# Patient Record
Sex: Female | Born: 1960 | Race: White | Hispanic: No | Marital: Married | Smoking: Never smoker
Health system: Southern US, Community
[De-identification: ages and names within clinical notes are randomized; demographics above are authoritative.]

## PROBLEM LIST (undated history)

## (undated) DIAGNOSIS — T7840XA Allergy, unspecified, initial encounter: Secondary | ICD-10-CM

## (undated) DIAGNOSIS — M069 Rheumatoid arthritis, unspecified: Secondary | ICD-10-CM

## (undated) DIAGNOSIS — G4711 Idiopathic hypersomnia with long sleep time: Secondary | ICD-10-CM

## (undated) DIAGNOSIS — R87619 Unspecified abnormal cytological findings in specimens from cervix uteri: Secondary | ICD-10-CM

## (undated) DIAGNOSIS — K589 Irritable bowel syndrome without diarrhea: Secondary | ICD-10-CM

## (undated) HISTORY — DX: Allergy, unspecified, initial encounter: T78.40XA

## (undated) HISTORY — DX: Irritable bowel syndrome, unspecified: K58.9

## (undated) HISTORY — PX: CRYOTHERAPY: SHX1416

## (undated) HISTORY — DX: Unspecified abnormal cytological findings in specimens from cervix uteri: R87.619

## (undated) HISTORY — DX: Idiopathic hypersomnia with long sleep time: G47.11

## (undated) HISTORY — PX: SHOULDER SURGERY: SHX246

## (undated) HISTORY — DX: Rheumatoid arthritis, unspecified: M06.9

## (undated) HISTORY — PX: TONSILLECTOMY: SHX5217

---

## 1994-05-26 DIAGNOSIS — R87619 Unspecified abnormal cytological findings in specimens from cervix uteri: Secondary | ICD-10-CM

## 1994-05-26 HISTORY — DX: Unspecified abnormal cytological findings in specimens from cervix uteri: R87.619

## 2000-03-31 ENCOUNTER — Other Ambulatory Visit: Admission: RE | Admit: 2000-03-31 | Discharge: 2000-03-31 | Payer: Self-pay | Admitting: *Deleted

## 2001-06-16 ENCOUNTER — Other Ambulatory Visit: Admission: RE | Admit: 2001-06-16 | Discharge: 2001-06-16 | Payer: Self-pay | Admitting: *Deleted

## 2001-12-19 ENCOUNTER — Ambulatory Visit (HOSPITAL_BASED_OUTPATIENT_CLINIC_OR_DEPARTMENT_OTHER): Admission: RE | Admit: 2001-12-19 | Discharge: 2001-12-19 | Payer: Self-pay | Admitting: Pulmonary Disease

## 2001-12-19 ENCOUNTER — Encounter: Payer: Self-pay | Admitting: Pulmonary Disease

## 2003-07-05 ENCOUNTER — Encounter: Payer: Self-pay | Admitting: Pulmonary Disease

## 2003-07-05 ENCOUNTER — Ambulatory Visit (HOSPITAL_BASED_OUTPATIENT_CLINIC_OR_DEPARTMENT_OTHER): Admission: RE | Admit: 2003-07-05 | Discharge: 2003-07-05 | Payer: Self-pay | Admitting: Pulmonary Disease

## 2004-01-18 ENCOUNTER — Ambulatory Visit (HOSPITAL_COMMUNITY): Admission: RE | Admit: 2004-01-18 | Discharge: 2004-01-18 | Payer: Self-pay | Admitting: Pulmonary Disease

## 2004-03-07 ENCOUNTER — Ambulatory Visit (HOSPITAL_COMMUNITY): Admission: RE | Admit: 2004-03-07 | Discharge: 2004-03-07 | Payer: Self-pay | Admitting: Neurology

## 2004-12-11 ENCOUNTER — Ambulatory Visit: Payer: Self-pay | Admitting: Pulmonary Disease

## 2005-10-17 ENCOUNTER — Ambulatory Visit: Payer: Self-pay | Admitting: Pulmonary Disease

## 2006-04-29 ENCOUNTER — Encounter: Admission: RE | Admit: 2006-04-29 | Discharge: 2006-04-29 | Payer: Self-pay | Admitting: Pulmonary Disease

## 2006-05-06 ENCOUNTER — Encounter: Admission: RE | Admit: 2006-05-06 | Discharge: 2006-05-06 | Payer: Self-pay | Admitting: Pulmonary Disease

## 2006-07-14 ENCOUNTER — Ambulatory Visit: Payer: Self-pay | Admitting: Pulmonary Disease

## 2006-07-21 ENCOUNTER — Ambulatory Visit: Payer: Self-pay | Admitting: Pulmonary Disease

## 2006-07-21 LAB — CONVERTED CEMR LAB
ALT: 21 units/L (ref 0–40)
AST: 25 units/L (ref 0–37)
Albumin: 3.7 g/dL (ref 3.5–5.2)
Basophils Relative: 0.3 % (ref 0.0–1.0)
Bilirubin Urine: NEGATIVE
CO2: 29 meq/L (ref 19–32)
Calcium: 9.1 mg/dL (ref 8.4–10.5)
Chloride: 105 meq/L (ref 96–112)
Creatinine, Ser: 1.1 mg/dL (ref 0.4–1.2)
Eosinophils Absolute: 0.1 10*3/uL (ref 0.0–0.6)
Eosinophils Relative: 1.6 % (ref 0.0–5.0)
Monocytes Absolute: 0.6 10*3/uL (ref 0.2–0.7)
Neutro Abs: 4 10*3/uL (ref 1.4–7.7)
RBC: 4.49 M/uL (ref 3.87–5.11)
Sed Rate: 4 mm/hr (ref 0–25)
Specific Gravity, Urine: 1.015 (ref 1.000–1.03)
Total Bilirubin: 1.3 mg/dL — ABNORMAL HIGH (ref 0.3–1.2)
Urine Glucose: NEGATIVE mg/dL
Urobilinogen, UA: 0.2 (ref 0.0–1.0)
VLDL: 14 mg/dL (ref 0–40)
pH: 6 (ref 5.0–8.0)

## 2006-07-22 ENCOUNTER — Encounter: Payer: Self-pay | Admitting: Pulmonary Disease

## 2006-07-22 ENCOUNTER — Encounter: Admission: RE | Admit: 2006-07-22 | Discharge: 2006-07-22 | Payer: Self-pay | Admitting: Pulmonary Disease

## 2006-10-23 ENCOUNTER — Ambulatory Visit (HOSPITAL_COMMUNITY): Admission: RE | Admit: 2006-10-23 | Discharge: 2006-10-23 | Payer: Self-pay | Admitting: Neurology

## 2007-05-10 ENCOUNTER — Encounter: Payer: Self-pay | Admitting: Pulmonary Disease

## 2007-05-10 DIAGNOSIS — T7840XA Allergy, unspecified, initial encounter: Secondary | ICD-10-CM | POA: Insufficient documentation

## 2007-07-02 ENCOUNTER — Ambulatory Visit: Payer: Self-pay | Admitting: Pulmonary Disease

## 2007-07-02 DIAGNOSIS — G4711 Idiopathic hypersomnia with long sleep time: Secondary | ICD-10-CM | POA: Insufficient documentation

## 2007-07-02 DIAGNOSIS — G47419 Narcolepsy without cataplexy: Secondary | ICD-10-CM | POA: Insufficient documentation

## 2007-07-19 ENCOUNTER — Telehealth (INDEPENDENT_AMBULATORY_CARE_PROVIDER_SITE_OTHER): Payer: Self-pay | Admitting: *Deleted

## 2007-07-27 ENCOUNTER — Telehealth (INDEPENDENT_AMBULATORY_CARE_PROVIDER_SITE_OTHER): Payer: Self-pay | Admitting: *Deleted

## 2007-08-04 ENCOUNTER — Encounter: Payer: Self-pay | Admitting: Pulmonary Disease

## 2007-08-05 ENCOUNTER — Encounter: Payer: Self-pay | Admitting: Pulmonary Disease

## 2007-09-01 ENCOUNTER — Telehealth: Payer: Self-pay | Admitting: Pulmonary Disease

## 2007-10-21 ENCOUNTER — Other Ambulatory Visit: Admission: RE | Admit: 2007-10-21 | Discharge: 2007-10-21 | Payer: Self-pay | Admitting: Obstetrics and Gynecology

## 2008-04-27 ENCOUNTER — Ambulatory Visit: Payer: Self-pay | Admitting: Pulmonary Disease

## 2008-05-30 ENCOUNTER — Telehealth (INDEPENDENT_AMBULATORY_CARE_PROVIDER_SITE_OTHER): Payer: Self-pay | Admitting: *Deleted

## 2008-07-19 ENCOUNTER — Telehealth: Payer: Self-pay | Admitting: Pulmonary Disease

## 2009-03-22 ENCOUNTER — Telehealth (INDEPENDENT_AMBULATORY_CARE_PROVIDER_SITE_OTHER): Payer: Self-pay | Admitting: *Deleted

## 2009-04-06 ENCOUNTER — Telehealth: Payer: Self-pay | Admitting: Pulmonary Disease

## 2009-04-10 ENCOUNTER — Encounter: Payer: Self-pay | Admitting: Pulmonary Disease

## 2009-04-25 ENCOUNTER — Ambulatory Visit: Payer: Self-pay | Admitting: Pulmonary Disease

## 2009-12-21 ENCOUNTER — Ambulatory Visit: Payer: Self-pay | Admitting: Pulmonary Disease

## 2010-01-22 ENCOUNTER — Encounter: Admission: RE | Admit: 2010-01-22 | Discharge: 2010-01-22 | Payer: Self-pay | Admitting: Obstetrics and Gynecology

## 2010-04-01 ENCOUNTER — Telehealth (INDEPENDENT_AMBULATORY_CARE_PROVIDER_SITE_OTHER): Payer: Self-pay | Admitting: *Deleted

## 2010-06-16 ENCOUNTER — Encounter: Payer: Self-pay | Admitting: Pulmonary Disease

## 2010-06-20 ENCOUNTER — Telehealth (INDEPENDENT_AMBULATORY_CARE_PROVIDER_SITE_OTHER): Payer: Self-pay | Admitting: *Deleted

## 2010-06-25 NOTE — Assessment & Plan Note (Signed)
Summary: no visit...apptm mixup.   Copy to:  Anne Hahn Primary Provider/Referring Provider:  Kriste Basque  CC:  Pt is here for a f/u appt.   Pt denied any complaints today.  Pt is just requesting a refill on her Provigil. Marland Kitchen  History of Present Illness: no visit required.  Medications Prior to Update: 1)  Provigil 200 Mg Tabs (Modafinil) .... Take 1 Tablet By Mouth Two Times A Day  Allergies (verified): 1)  ! Keflex  Vital Signs:  Patient profile:   50 year old female Height:      64 inches Weight:      121 pounds BMI:     20.84 O2 Sat:      98 % on Room air Temp:     98.1 degrees F oral Pulse rate:   78 / minute BP sitting:   104 / 66  (left arm) Cuff size:   regular  Vitals Entered By: Arman Filter LPN (December 21, 2009 9:04 AM)  O2 Flow:  Room air CC: Pt is here for a f/u appt.   Pt denied any complaints today.  Pt is just requesting a refill on her Provigil.  Comments Medications reviewed with patient Arman Filter LPN  December 21, 2009 9:04 AM     Other Orders: No Charge Patient Arrived (NCPA0) (NCPA0) Prescriptions: PROVIGIL 200 MG TABS (MODAFINIL) Take 1 tablet by mouth two times a day  #60 x 12   Entered and Authorized by:   Barbaraann Share MD   Signed by:   Barbaraann Share MD on 12/21/2009   Method used:   Print then Give to Patient   RxID:   [id number]

## 2010-06-25 NOTE — Progress Notes (Signed)
Summary: rx  Phone Note Call from Patient Call back at [phone]   Caller: Patient Call For: Miranda [name] Reason for Call: Talk to Nurse Summary of Call: want to switch back to Provigil... New stuff not working as well.   [facility] [address] Initial call taken by: Eugene Gavia,  July 19, 2008 9:02 AM  Follow-up for Phone Call        called and spoke with pt---she stated that she would like to switch back to provigil---she stated that the nuvigil seems not to work as well.  pt stated that she will take her last one today--explained that [facility] was not in the office this week, and pt voiced her understanding.  please advise. Marijo File CMA  July 19, 2008 9:23 AM   Additional Follow-up for Phone Call Additional follow up Details #1::        ok to switch back to provigil as before. Additional Follow-up by: Barbaraann Share MD,  July 24, 2008 7:56 PM    Additional Follow-up for Phone Call Additional follow up Details #2::    called and spoke with pt. pt aware ok to switch back to provigil.  Megan Reynolds LPN  July 25, 9321 9:25 AM   New/Updated Medications: PROVIGIL 200 MG TABS (MODAFINIL) Take 1 tablet by mouth two times a day   Prescriptions: PROVIGIL 200 MG TABS (MODAFINIL) Take 1 tablet by mouth two times a day  #60 x 5   Entered by:   Arman Filter LPN   Authorized by:   Barbaraann Share MD   Signed by:   Arman Filter LPN on 55/73/2202   Method used:   Telephoned to ...       [facility]  [place]. [phone]* (retail)       [address]       [place], [place]  [address]       Ph: [phone]       Fax: [phone]   RxID:   [id number]

## 2010-06-25 NOTE — Progress Notes (Signed)
Summary: requesting rheumatologist referral -  Phone Note Call from Patient   Caller: Patient Call For: clance Summary of Call: pt states that dr clance is aware that she will contact him for a referral to see rheumatologist. Initial call taken by: Rickard Patience,  April 01, 2010 3:44 PM  Follow-up for Phone Call        Called, spoke with pt.  She is requesting for [facility] to refer her to rheumatoligist at [facility] - Dr. Louann Liv.  States she does not have a PCP right now.  Dr. Shelle Iron, pls advise if this is ok.  Thanks! Follow-up by: Gweneth Dimitri RN,  April 01, 2010 4:15 PM  Additional Follow-up for Phone Call Additional follow up Details #1::        I thought nadel was her primary md? I am happy to refer her, but why does she think she needs to see a rheumatologist? Additional Follow-up by: Barbaraann Share MD,  April 01, 2010 4:43 PM    Additional Follow-up for Phone Call Additional follow up Details #2::    Spoke with pt.  She states that she used to see SN, but not since 2008.  She states that she would like to be reffered to neurologist b/c she has had joint pain and muscle weakness that has been worsening over the past 6 months.  Pls advise if okay to send order to [facility] thanks! Follow-up by: Vernie Murders,  April 01, 2010 4:52 PM  Additional Follow-up for Phone Call Additional follow up Details #3:: Details for Additional Follow-up Action Taken: so is it a rheumatologist or neurologist.  each message says something different.  Sorry, should state rheumatologist Vernie Murders  April 01, 2010 5:02 PM  referral sent to [facility].  Pt aware order was sent to [facility] for referral Vernie Murders  April 01, 2010 5:16 PM  Additional Follow-up by: Barbaraann Share MD,  April 01, 2010 4:56 PM    [facility] to inform pt order sent to [facility]. Gweneth Dimitri RN  April 01, 2010 5:11 PM

## 2010-06-26 ENCOUNTER — Telehealth (INDEPENDENT_AMBULATORY_CARE_PROVIDER_SITE_OTHER): Payer: Self-pay | Admitting: *Deleted

## 2010-06-27 NOTE — Progress Notes (Signed)
Summary: prior auth for Provigil  Phone Note Call from Patient   Caller: Patient Call For: dr Shelle Iron Summary of Call: patient phoned stated that her phamacy advised her that they faxed a request yesterday for her Provigil because it needs to pre authorized. Patient just wanted to verify that we recieved the request and that we are taking care of this. She uses [facility]. She can be reached at [phone] Initial call taken by: Vedia Coffer,  June 20, 2010 3:13 PM  Follow-up for Phone Call        pt says that [facility] [address] has been waiting x 2 days for prior auth. pt needs this today as she is out. Tivis Ringer, CNA  June 21, 2010 8:44 AM  Called [employer] to initate prior auth.  Spoke with [employer].  Per Lynden Ang, there is already an authorization for provigil 200mg  from 03/23/09-12/17/11.  States there was a Data processing manager in the system, and they needed to update the pharmacy's system.  States in approx 30 minutes, this should be able to go thru without any problems.   [facility] [address], spoke with Syam.  He was informed of above and verbalized understanding.    Called, spoke with pt.  She was also informed of above and verbalized understanding. Follow-up by: Gweneth Dimitri RN,  June 21, 2010 9:14 AM

## 2010-07-03 NOTE — Progress Notes (Signed)
Summary: records with referral to [facility]  Phone Note Call from Patient   Caller: Patient Call For: [facility] Summary of Call: patient phoned stated that we are referring her to [facility] and she was told the records would be sent automotcially with the referral and she spoke to them today and they have stated that they have not received the records. Patient can be reached at [phone] Initial call taken by: Vedia Coffer,  June 26, 2010 4:41 PM  Follow-up for Phone Call        will refax records to dr @ [facility] pt notified Follow-up by: Oneita Jolly,  June 27, 2010 8:36 AM

## 2010-08-23 ENCOUNTER — Other Ambulatory Visit: Payer: Self-pay | Admitting: Pulmonary Disease

## 2010-08-26 ENCOUNTER — Other Ambulatory Visit: Payer: Self-pay | Admitting: Pulmonary Disease

## 2010-12-20 ENCOUNTER — Ambulatory Visit: Payer: Self-pay | Admitting: Pulmonary Disease

## 2010-12-20 ENCOUNTER — Encounter: Payer: Self-pay | Admitting: Pulmonary Disease

## 2010-12-25 ENCOUNTER — Encounter: Payer: Self-pay | Admitting: Pulmonary Disease

## 2010-12-25 ENCOUNTER — Ambulatory Visit (INDEPENDENT_AMBULATORY_CARE_PROVIDER_SITE_OTHER): Payer: BC Managed Care – PPO | Admitting: Pulmonary Disease

## 2010-12-25 VITALS — BP 100/58 | HR 94 | Temp 97.8°F | Ht 63.0 in | Wt 116.8 lb

## 2010-12-25 DIAGNOSIS — G4710 Hypersomnia, unspecified: Secondary | ICD-10-CM

## 2010-12-25 MED ORDER — MODAFINIL 200 MG PO TABS: 200.0000 mg | ORAL_TABLET | Freq: Two times a day (BID) | ORAL | Status: DC

## 2010-12-25 NOTE — Patient Instructions (Signed)
No change in meds for now. If you improve significantly with treatment of your autoimmune process, I would give you a trial off provigil to see if still needed.  followup with me in one year if doing well.

## 2010-12-25 NOTE — Progress Notes (Signed)
  Subjective:    Patient ID: Miranda [name], female    DOB: March 04, 1961, 50 y.o.   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  She is staying on a good sleep hygiene regimen, and is taking  provigil in order to maintain her functional status/alertness during the day.  She feels the medication is working for her, and is not having any worsening daytime sleepiness.  Of note, she has been recently diagnosed with some type of autoimmune disease by rheumatology at [facility], and is currently taking plaquenil.    Review of Systems  Constitutional: Positive for fever. Negative for unexpected weight change.  HENT: Positive for postnasal drip. Negative for ear pain, nosebleeds, congestion, sore throat, rhinorrhea, sneezing, trouble swallowing, dental problem and sinus pressure.   Eyes: Negative for redness and itching.  Respiratory: Negative for cough, chest tightness, shortness of breath and wheezing.   Cardiovascular: Negative for palpitations and leg swelling.  Gastrointestinal: Negative for nausea and vomiting.  Genitourinary: Negative for dysuria.  Musculoskeletal: Positive for joint swelling.  Skin: Positive for rash.  Neurological: Negative for headaches.  Hematological: Bruises/bleeds easily.  Psychiatric/Behavioral: Negative for dysphoric mood. The patient is not nervous/anxious.        Objective:   Physical Exam Wd female in nad Nares without discharge or purulence LE without edema or cyanosis  Alert, oriented, not sleepy.  Moves all 4        Assessment & Plan:

## 2010-12-30 NOTE — Assessment & Plan Note (Signed)
The pt appears to be functioning reasonably well on her current behavioral and medication regimen.  I have asked her to continue with good sleep hygiene, exercise, and to use provigil for improved functioning during the day.  I do wonder if her recent diagnosis of an autoimmune process could be related to her hypersomnia?

## 2011-01-29 ENCOUNTER — Telehealth: Payer: Self-pay | Admitting: Pulmonary Disease

## 2011-01-29 NOTE — Telephone Encounter (Signed)
Spoke with the pt and she states she is having difficulty staying asleep at night and she is requesting an rx for [place]. She states she was given this from Dr. Shelle Iron in the past and it helped. She states he offered this at last OV but she said she did not need it at that time, but has since began to have issues.  Pt states she does not have any issues falling asleep. Please advise. Carron Curie, CMA

## 2011-01-29 NOTE — Telephone Encounter (Signed)
[id number].Makinze Jani, CMA  

## 2011-01-29 NOTE — Telephone Encounter (Signed)
I am happy to prescribe ambien SHORT term, while she is working on sleep hygiene.  This is not something I prescribe for a longer period of time. ambien cr 6.25mg  one at bedtime if needed.  #15, no fills.

## 2011-01-30 MED ORDER — ZOLPIDEM TARTRATE ER 6.25 MG PO TBCR: 6.2500 mg | EXTENDED_RELEASE_TABLET | Freq: Every evening | ORAL | Status: AC | PRN

## 2011-01-30 NOTE — Telephone Encounter (Signed)
LMTCB

## 2011-01-30 NOTE — Telephone Encounter (Signed)
Pt is aware of rx per KC. RX called to her pharmacy.

## 2011-02-26 ENCOUNTER — Telehealth: Payer: Self-pay | Admitting: Pulmonary Disease

## 2011-02-26 NOTE — Telephone Encounter (Signed)
I spoke with Budd Palmer about this.  He states if she brings her order from her rheumatologist to our lab they can draw this without [place] putting the orders in.  We have been rejected in the past, but Budd Palmer says this is wrong.  If we get a call from lab that won't do when she arrives, let Budd Palmer know and he will handle.

## 2011-02-26 NOTE — Telephone Encounter (Signed)
Dr. Shelle Iron referred the pt to Dr. Carolan Clines, rheumatology back in 04-01-2010. She saw him recently and he wrote orders to have a CBC w/ diff, creatinine, and hepatic function (Dx: V58.69)  done here, but our lab will not accept orders from outside docs. Pt wants to know if [facility] will order these labs so she can get them done here. Please advise. Carron Curie, CMA

## 2011-02-27 NOTE — Telephone Encounter (Signed)
After speaking with TD and Budd Palmer, it is known that the lab is not able to receive and outside paper order and that a physician within the system needs to be the responsible party and we will need to place order in from the floor. I called pt and she advised she does not have a PCP but has been to [facility] in the past. I informed pt that [facility] does see pt's on an PCP level and with her living in [place] and [place]. Louann Liv being in [place] she should go to [place] with order in hand and ask them to help her. Pt verbalized understanding and agreed with same. Pt states she has BC[employer] for insurance and does not believe she has to use any one lab and this will be for a standing order. Also encouraged pt to get a PCP ASAP.

## 2011-03-11 DIAGNOSIS — Z79899 Other long term (current) drug therapy: Secondary | ICD-10-CM | POA: Insufficient documentation

## 2011-03-11 DIAGNOSIS — M359 Systemic involvement of connective tissue, unspecified: Secondary | ICD-10-CM | POA: Insufficient documentation

## 2011-03-11 DIAGNOSIS — M489 Spondylopathy, unspecified: Secondary | ICD-10-CM | POA: Insufficient documentation

## 2011-04-29 ENCOUNTER — Other Ambulatory Visit: Payer: Self-pay | Admitting: Obstetrics and Gynecology

## 2011-04-29 DIAGNOSIS — Z1231 Encounter for screening mammogram for malignant neoplasm of breast: Secondary | ICD-10-CM

## 2011-05-28 ENCOUNTER — Ambulatory Visit
Admission: RE | Admit: 2011-05-28 | Discharge: 2011-05-28 | Disposition: A | Discharge status: Home / Self Care | Payer: BC Managed Care – PPO | Source: Ambulatory Visit | Attending: Obstetrics and Gynecology | Admitting: Obstetrics and Gynecology

## 2011-05-28 DIAGNOSIS — Z1231 Encounter for screening mammogram for malignant neoplasm of breast: Secondary | ICD-10-CM

## 2011-06-23 ENCOUNTER — Telehealth: Payer: Self-pay | Admitting: Pulmonary Disease

## 2011-06-23 NOTE — Telephone Encounter (Signed)
Spoke with pt, she states needs PA for provigil.  I have called the pharmacy and asked that they refax this form to the up front fax Will await fax, this must be done today as the pt is out of med as of today

## 2011-06-23 NOTE — Telephone Encounter (Signed)
Form printed and placed in KC's VIP folder.

## 2011-06-23 NOTE — Telephone Encounter (Addendum)
Received PA request from [facility]- called [employer] at [phone] Pt ID number [id number] Called to initiate PA and will await fax to be sent to the fax up front Reference number for this case is [id number]

## 2011-06-23 NOTE — Telephone Encounter (Signed)
done

## 2011-06-24 NOTE — Telephone Encounter (Signed)
Prior Auth faxed back yesterday afternoon at 5:45pm.  Will await approval or denial

## 2011-06-26 NOTE — Telephone Encounter (Signed)
Called and spoke with pt to give her an update on where we were at with her prior auth for Provigil.  Pt stated in the meantime, she is completely out of the med and wanted to pay out of pocket for a few tabs to last until we can get this straightened out with her ins.  i called and spoke with pt's pharmacy and was informed each tablet costs $36.99 but with [facility] discount it is $31.44.  Informed pt of this.  And stated we will keep in touch with her regarding this process.

## 2011-06-26 NOTE — Telephone Encounter (Signed)
Called and spoke with pt. Informed her still haven't gotten approval or denial from ins company.  Informed her response can take anywhere from 24-72 hrs but that i would call later today to check on status and then call her back with an update.

## 2011-06-26 NOTE — Telephone Encounter (Signed)
Pt called again- checking on status of prior auth. Hazel Sams

## 2011-06-26 NOTE — Telephone Encounter (Signed)
We need to speak with Valerie Roys, ext [phone], RN with the insurance company. Per rep, the nurse faxed a request for the sleep study to [facility] in our medical records today. He said they would need a copy of this study in order to make their decision. Corrie Dandy has left for the day and I had to leave her a msg to return my call on the next business day. Aundra Millet made aware and she will contact the pt to let her know what the hold-up is on her prescription.

## 2011-06-27 NOTE — Telephone Encounter (Signed)
Received fax from [facility] today stating Provigil has been approved from 06/23/2011-03/18/2014.  Ref # is [id number].  I have informed both pt and her pharmacy of the above information and I have sent paperwork down to med. recs to be scanned into pt's chart.

## 2011-06-30 ENCOUNTER — Telehealth: Payer: Self-pay | Admitting: Pulmonary Disease

## 2011-06-30 MED ORDER — MODAFINIL 200 MG PO TABS: 200.0000 mg | ORAL_TABLET | Freq: Two times a day (BID) | ORAL | Status: DC

## 2011-06-30 NOTE — Telephone Encounter (Signed)
[facility]. Pt aware.Carron Curie, CMA

## 2011-07-22 ENCOUNTER — Telehealth (HOSPITAL_COMMUNITY): Payer: Self-pay

## 2011-10-28 ENCOUNTER — Encounter: Payer: BC Managed Care – PPO | Attending: "Endocrinology | Admitting: *Deleted

## 2011-10-28 DIAGNOSIS — Z713 Dietary counseling and surveillance: Secondary | ICD-10-CM | POA: Insufficient documentation

## 2011-10-29 NOTE — Progress Notes (Signed)
Patient attended basic nutrition class on 10/28/11.  Topics covered include:   1. Complications of Hyperlipidemia and/or Hypertension. 2. Ways to reduce risk of heart disease.  3. Identifying fat and sodium content on food labels. 4. Ways to decrease sodium intake. 5. Optimal amount of daily saturated fat intake. 6. Optimal amount of daily sodium intake.  7. Foods to limit/avoid on a heart healthy diet. 8. MyPlate and portion control.   Patient to follow-up with [facility] prn.  

## 2011-12-10 ENCOUNTER — Encounter (HOSPITAL_COMMUNITY): Payer: Self-pay | Admitting: *Deleted

## 2011-12-10 ENCOUNTER — Emergency Department (HOSPITAL_COMMUNITY)
Admission: EM | Admit: 2011-12-10 | Discharge: 2011-12-10 | Disposition: A | Discharge status: Home / Self Care | Payer: BC Managed Care – PPO | Source: Home / Self Care | Attending: Emergency Medicine | Admitting: Emergency Medicine

## 2011-12-10 DIAGNOSIS — L259 Unspecified contact dermatitis, unspecified cause: Secondary | ICD-10-CM

## 2011-12-10 MED ORDER — TRIAMCINOLONE ACETONIDE 0.1 % EX CREA: TOPICAL_CREAM | Freq: Two times a day (BID) | CUTANEOUS | Status: DC

## 2011-12-10 MED ORDER — PREDNISONE 20 MG PO TABS: 20.0000 mg | ORAL_TABLET | Freq: Every day | ORAL | Status: DC

## 2011-12-10 MED ORDER — HYDROXYZINE HCL 25 MG PO TABS: 25.0000 mg | ORAL_TABLET | Freq: Three times a day (TID) | ORAL | Status: AC | PRN

## 2011-12-10 MED ORDER — HYDROXYZINE HCL 25 MG PO TABS: 25.0000 mg | ORAL_TABLET | Freq: Four times a day (QID) | ORAL | Status: DC

## 2011-12-10 NOTE — ED Provider Notes (Signed)
History     CSN: [id number]  Arrival date & time 12/10/11  1757   First MD Initiated Contact with Patient 12/10/11 1804      Chief Complaint  Patient presents with  . Rash    (Consider location/radiation/quality/duration/timing/severity/associated sxs/prior treatment) HPI Comments: Patient presents urgent care as she is aware that she has been in contact with poison ivy ..and she has developed a rash both of her arms and the anterior aspect of both of her lower legs. The rash is itchy and it's been getting worse for the last 8 days. She has had poison ivy in the past but this particular event is worse than the previous ones. Patient denies any systemic symptoms such as fevers, generalized malaise, arthralgias or myalgias or headaches she has tried with some over-the-counter creams and lotions for itchiness but they have not helped much.  Patient is a 51 y.o. female presenting with rash. The history is provided by the patient.  Rash  This is a new problem. The current episode started more than 1 week ago. The problem has not changed since onset.The problem is associated with plant contact. There has been no fever. The rash is present on the left ankle, right arm, right lower leg, left wrist and left arm. The patient is experiencing no pain. The pain has been worsening since onset. Associated symptoms include itching and weeping. She has tried OTC analgesics and cold cream for the symptoms. The treatment provided no relief.    Past Medical History  Diagnosis Date  . Idiopathic hypersomnia   . Allergy, unspecified not elsewhere classified     Past Surgical History  Procedure Date  . Shoulder surgery     right  . Tonsillectomy     No family history on file.  History  Substance Use Topics  . Smoking status: Never Smoker   . Smokeless tobacco: Not on file  . Alcohol Use: Not on file    OB History    Grav Para Term Preterm Abortions TAB SAB Ect Mult Living                   Review of Systems  Constitutional: Negative for fever, chills, activity change, appetite change and fatigue.  Skin: Positive for itching and rash. Negative for color change, pallor and wound.    Allergies  Cephalexin  Home Medications   Current Outpatient Rx  Name Route Sig Dispense Refill  . METHOTREXATE PO Oral Take by mouth.    Marland Kitchen HYDROXYCHLOROQUINE SULFATE 200 MG PO TABS Oral Take 200 mg by mouth 2 (two) times daily.      Marland Kitchen HYDROXYZINE HCL 25 MG PO TABS Oral Take 1 tablet (25 mg total) by mouth every 8 (eight) hours as needed for itching. 12 tablet 0  . MODAFINIL 200 MG PO TABS Oral Take 1 tablet (200 mg total) by mouth 2 (two) times daily. 60 tablet 5  . PREDNISONE 20 MG PO TABS Oral Take 1 tablet (20 mg total) by mouth daily. 2 tablets daily for 5 days 10 tablet 0  . TRIAMCINOLONE ACETONIDE 0.1 % EX CREA Topical Apply topically 2 (two) times daily. Apply bid x 2 weeks 30 g 0    BP 104/69  Pulse 82  Temp 97.8 F (36.6 C) (Oral)  Resp 16  SpO2 98%  Physical Exam  Nursing note and vitals reviewed. Constitutional: She appears well-developed and well-nourished.  Non-toxic appearance. She does not have a sickly appearance. She does not appear  ill. No distress.  Musculoskeletal: She exhibits no tenderness.  Neurological: She is alert.  Skin: Rash noted. Rash is macular, papular and pustular. No erythema.       ED Course  Procedures (including critical care time)  Labs Reviewed - No data to display No results found.   1. Contact dermatitis       MDM  Contact dermatitis most likely for poison ivy. Patient was prescribed prednisone for 5 days, topical triamcinolone cream along with hydroxyzine.        Jimmie Molly, MD 12/10/11 2134

## 2011-12-10 NOTE — ED Notes (Signed)
Pt  Reports  Symptoms  Of  Itchy  Rash  On  Both  Arms  l  Worse  thah  The  Right  Symptoms  X  8  Days    She  Reports  Was  [employer]  About 1  Week  Ago       -  She  Reports  Has  Had  Poison ivy in past

## 2011-12-17 ENCOUNTER — Ambulatory Visit (INDEPENDENT_AMBULATORY_CARE_PROVIDER_SITE_OTHER): Payer: BC Managed Care – PPO | Admitting: Family Medicine

## 2011-12-17 VITALS — BP 100/72 | HR 100 | Temp 99.1°F | Resp 18 | Wt 120.0 lb

## 2011-12-17 DIAGNOSIS — L255 Unspecified contact dermatitis due to plants, except food: Secondary | ICD-10-CM

## 2011-12-17 DIAGNOSIS — L237 Allergic contact dermatitis due to plants, except food: Secondary | ICD-10-CM

## 2011-12-17 MED ORDER — METHYLPREDNISOLONE ACETATE 80 MG/ML IJ SUSP
80.0000 mg | Freq: Once | INTRAMUSCULAR | Status: AC
  Administered 2011-12-17: 80 mg via INTRAMUSCULAR

## 2011-12-17 MED ORDER — PREDNISONE 20 MG PO TABS: ORAL_TABLET | ORAL | Status: DC

## 2011-12-17 NOTE — Progress Notes (Signed)
@[email or web address] @  Patient ID: Miranda [name] MRN: [id number], DOB: [birth year], 51 y.o. Date of Encounter: 12/17/2011, 10:04 AM  Primary Physician: No primary provider on file.  Chief Complaint: Pruritic rash  HPI: 51 y.o. year old female with presents with 21 day history of mildly erythematous pruritic rash along scalp, forearms, back and legs. Patient was doing yard work prior to the development of the rash. Known poison ivy in the vicinity. Has not yet washed all clothing or linens that have been exposed. Lesions now weeping clear fluid. Has tried a course of prednisone for 5 days which helped initially..  Patient is otherwise doing well without issues or complaints.  Past Medical History  Diagnosis Date  . Idiopathic hypersomnia   . Allergy, unspecified not elsewhere classified      Home Meds: Prior to Admission medications   Medication Sig Start Date End Date Taking? Authorizing Provider  hydrOXYzine (ATARAX/VISTARIL) 25 MG tablet Take 1 tablet (25 mg total) by mouth every 8 (eight) hours as needed for itching. 12/10/11 12/20/11 Yes Jimmie Molly, MD  Methotrexate Sodium (METHOTREXATE PO) Take by mouth.   Yes Historical Provider, MD  modafinil (PROVIGIL) 200 MG tablet Take 1 tablet (200 mg total) by mouth 2 (two) times daily. 06/30/11  Yes Barbaraann Share, MD  triamcinolone cream (KENALOG) 0.1 % Apply topically 2 (two) times daily. Apply bid x 2 weeks 12/10/11 12/09/12 Yes Jimmie Molly, MD    Allergies:  Allergies  Allergen Reactions  . Cephalexin     History   Social History  . Marital Status: Single    Spouse Name: N/A    Number of Children: N/A  . Years of Education: N/A   Occupational History  . Not on file.   Social History Main Topics  . Smoking status: Never Smoker   . Smokeless tobacco: Not on file  . Alcohol Use: Not on file  . Drug Use: Not on file  . Sexually Active: Not on file   Other Topics Concern  . Not on file   Social History Narrative  . No narrative on  file     Review of Systems: Constitutional: negative for chills, fever, night sweats, weight changes, or fatigue  HEENT: negative for vision changes, hearing loss, congestion, rhinorrhea, ST, epistaxis, or sinus pressure Cardiovascular: negative for chest pain or palpitations Respiratory: negative for hemoptysis, wheezing, shortness of breath, or cough Abdominal: negative for abdominal pain, nausea, vomiting, diarrhea, or constipation Dermatological: see above Neurologic: negative for headache, dizziness, or syncope   Physical Exam: Blood pressure 100/72, pulse 100, temperature 99.1 F (37.3 C), temperature source Oral, resp. rate 18, weight 120 lb (54.432 kg)., There is no height on file to calculate BMI. General: Well developed, well nourished, in no acute distress. Head: Normocephalic, atraumatic, eyes without discharge, sclera non-icteric, nares are without discharge. Bilateral auditory canals clear, TM's are without perforation, pearly grey and translucent with reflective cone of light bilaterally. Oral cavity moist, posterior pharynx without exudate, erythema, peritonsillar abscess, or post nasal drip.  Neck: Supple. No thyromegaly. Full ROM. No lymphadenopathy. Lungs: Clear bilaterally to auscultation without wheezes, rales, or rhonchi. Breathing is unlabored. Heart: RRR with S1 S2. No murmurs, rubs, or gallops appreciated. Msk:  Strength and tone normal for age. Extremities/Skin: Warm and dry. Multiple vesicular weeping lesions along left forearm, diffuse papular rash on back and neck and right arm consistent with poison ivy. No secondary infections present. No clubbing or cyanosis. No edema.  Neuro: Alert and oriented X  3. Moves all extremities spontaneously. Gait is normal. CNII-XII grossly in tact. Psych:  Responds to questions appropriately with a normal affect.      ASSESSMENT AND PLAN:  51 y.o. year old female with poison ivy DepoMedrol 80 -Prednisone 20 mg  #12 -Zyrtec -Zantac -Benadryl -Wash all contaminated clothes and linens -RTC 10 days if symptoms persist, sooner if they worsen   Signed, Elvina Sidle, MD 12/17/2011 10:04 AM

## 2011-12-25 ENCOUNTER — Ambulatory Visit: Payer: BC Managed Care – PPO | Admitting: Pulmonary Disease

## 2011-12-31 ENCOUNTER — Ambulatory Visit (INDEPENDENT_AMBULATORY_CARE_PROVIDER_SITE_OTHER): Payer: BC Managed Care – PPO | Admitting: Family Medicine

## 2011-12-31 ENCOUNTER — Telehealth: Payer: Self-pay

## 2011-12-31 VITALS — BP 109/72 | HR 79 | Temp 98.2°F | Resp 16 | Ht 63.5 in | Wt 121.0 lb

## 2011-12-31 DIAGNOSIS — T50905A Adverse effect of unspecified drugs, medicaments and biological substances, initial encounter: Secondary | ICD-10-CM

## 2011-12-31 DIAGNOSIS — Z79899 Other long term (current) drug therapy: Secondary | ICD-10-CM

## 2011-12-31 DIAGNOSIS — T887XXA Unspecified adverse effect of drug or medicament, initial encounter: Secondary | ICD-10-CM

## 2011-12-31 DIAGNOSIS — L259 Unspecified contact dermatitis, unspecified cause: Secondary | ICD-10-CM

## 2011-12-31 MED ORDER — PREDNISONE 20 MG PO TABS: ORAL_TABLET | ORAL | Status: AC

## 2011-12-31 NOTE — Progress Notes (Signed)
Faxed last two office notes and [facility] office note to Dr. Romeo Apple office. Fax number documents were faxed to [phone].

## 2011-12-31 NOTE — Progress Notes (Signed)
Subjective:    Patient ID: Miranda [name], female    DOB: 1960-12-04, 51 y.o.   MRN: [id number]  HPI Miranda [name] is a 51 y.o. female Seen 7/24 with likely poison ivy - summary form that ov: 21 day history of mildly erythematous pruritic rash along scalp, forearms, back and legs. Patient was doing yard work prior to the development of the rash. Known poison ivy in the vicinity. Has not yet washed all clothing or linens that have been exposed. Lesions now weeping clear fluid. Had tried a course of prednisone for 5 days which helped initially. Treated with prednisone 20mg  #12 - 7 day course, and depomedrol 80mg  IM. Taking antihistamine every 6 hours form [facility] - 1st episode.  2-3 days between prednisone at [facility] and our taper.    Rash was improving on prednisone, but not resolved.  No side effects on prednisone. Rash worse in past 2 days, worst last night.  Same areas involved - coming back and now on chest.  No fever. No new soaps, lotions, or creams.  No new meds.  On provigil for 8 years. No fever, itching keeps up at night.  Similar rash 15 years ago - dx with eczema or contact dermatitis.  Burns with triamcinolone cream. Using once per day.  No mucosal blisters or new lesions.   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  Dr. Carolan Clines at [facility].  ON MTX past 6 months.   Outside pet (chihuahua, and lab mix), but no known exposure of animal to poison ivy area.  Affected areas, L greater than R arm., neck,  scalp (less), inguinal areas,  and chest.  .Review of Systems  As above.     Objective:   Physical Exam  Constitutional: She is oriented to person, place, and time. She appears well-developed and well-nourished.  HENT:  Head: Normocephalic and atraumatic.  Right Ear: External ear normal.  Left Ear: External ear normal.  Mouth/Throat: Oropharynx is clear and moist. No oropharyngeal exudate.    Pulmonary/Chest: Effort normal and breath sounds normal. No stridor. No respiratory distress.  Neurological: She is alert and oriented to person, place, and time.  Skin: Rash noted. Rash is maculopapular. There is erythema.          Excoriated patches - bilaterral antecubital areas., behind ears, neck.   Maculopapular rash on upper chest - abdomen, lower abdomen.   See photos of chest, abdomen lesions.     Results for orders placed in visit on 12/31/11  GLUCOSE, POCT (MANUAL RESULT ENTRY)      Component Value Range   POC Glucose 100 (*) 70 - 99 mg/dl        Assessment & Plan:  Miranda [name] is a 51 y.o. female 1. Encounter for long-term (current) use of other medications  POCT glucose (manual entry)  2. Contact dermatitis  POCT glucose (manual entry)  3. Drug reaction  POCT glucose (manual entry)   Suspected initial contact dermatitis, with recurrence of prednisone and now with maculopapular rash on chest and abdomen concerning for drug reaction.  Discussed with rheumatology fellow on call for Dr. Carolan Clines.  Will hold methotrexate at this point, copy of phots given to pt to follow up with them - they will call pt to determine follow up and status of methotrexate. Repeat prednisone taper - 60-10 over 9 days. Stop methotrexate.  Benadryl otc q4-6 h prn, can change to zyrtec as improved. Rtc/er  precautions given.Marland Kitchen

## 2011-12-31 NOTE — Telephone Encounter (Signed)
Dr Milus Glazier note indicates if symptoms persist she is to return to clinic, she is advised and she will come in today.

## 2011-12-31 NOTE — Patient Instructions (Signed)
Stop methotrexate.  Start prednisone taper. Your rheumatologist should be calling you about follow up.  Bring the photos with you.  Benadryl over the counter up to every 4 - 6 hours as needed for itching.  Can change to zyrtec as symptoms improve. Return to the clinic or go to the nearest emergency room if any of your symptoms worsen or new symptoms occur.

## 2011-12-31 NOTE — Telephone Encounter (Signed)
pT CALLED She saw Dr L last week and received a shot and prescription for prednisone.  The problem has re occurred.  She would like to know if she can just get another prescription or does she need to be seen again.  Patient phone [phone]  Pharmacy [facility]   [phone]  Please call back

## 2012-01-12 ENCOUNTER — Telehealth: Payer: Self-pay

## 2012-01-12 DIAGNOSIS — R21 Rash and other nonspecific skin eruption: Secondary | ICD-10-CM

## 2012-01-12 MED ORDER — PREDNISONE 20 MG PO TABS: ORAL_TABLET | ORAL | Status: AC

## 2012-01-12 NOTE — Telephone Encounter (Signed)
I have called her to advise and she wants something sooner with dermatology referral put in for her.

## 2012-01-12 NOTE — Telephone Encounter (Signed)
Patient has been told by the Rheumatologist to see a dermatologist, she states this has been sent urgently, can we give her one more course of the prednisone to get her through until the dermatology clinic can see her.

## 2012-01-12 NOTE — Telephone Encounter (Signed)
Referral was started to Dr Terri Piedra for pt.

## 2012-01-12 NOTE — Addendum Note (Signed)
Addended by: Nelva Nay on: 01/12/2012 03:32 PM   Modules accepted: Orders

## 2012-01-12 NOTE — Addendum Note (Signed)
Addended byCaffie Damme on: 01/12/2012 04:22 PM   Modules accepted: Orders

## 2012-01-12 NOTE — Telephone Encounter (Signed)
Please call patient and find out if she has stopped her methotrexate. Did she follow up with her rheumatologist? She will need to either follow up with [place] or see her rheumatologist.

## 2012-01-12 NOTE — Telephone Encounter (Signed)
Rx sent to pharmacy   

## 2012-01-12 NOTE — Telephone Encounter (Signed)
DR. Neva Seat   PT STATES THE RASH HAS RETURNED AND IS REQUESTING A REFILL ON SCRIPT   [facility] [address] [facility])  [facility] [phone]

## 2012-01-12 NOTE — Telephone Encounter (Signed)
     Had tried a course of prednisone for 5 days which helped initially. Treated with prednisone 20mg  #12 - 7 day course, and depomedrol 80mg  IM. Taking [facility] 6 hours [facility] - [facility]st episode. 2-3 days between [facility] at [facility] and our taper.  Rash was improving on prednisone, but not resolved. No side effects on prednisone. Rash worse in past 2 days, worst last night. Same areas involved - coming back and now on chest. No fever. No new soaps, lotions, or creams. No new meds. On provigil for 8 years. No fever, itching keeps up at night. Similar rash 15 years ago - dx with eczema or contact dermatitis. Burns with triamcinolone cream. Using once per day. No mucosal blisters or new lesions.  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 Dr. Carolan Clines at [facility]. ON MTX past 6 months.    From 12/31/11 office visit. Now pts rash has recurred, has taken prednisone course x2 for this please advise what is next step?

## 2012-01-15 ENCOUNTER — Ambulatory Visit (INDEPENDENT_AMBULATORY_CARE_PROVIDER_SITE_OTHER): Payer: BC Managed Care – PPO | Admitting: Pulmonary Disease

## 2012-01-15 ENCOUNTER — Encounter: Payer: Self-pay | Admitting: Pulmonary Disease

## 2012-01-15 VITALS — BP 110/80 | HR 87 | Temp 97.4°F | Ht 63.5 in | Wt 125.8 lb

## 2012-01-15 DIAGNOSIS — G4700 Insomnia, unspecified: Secondary | ICD-10-CM

## 2012-01-15 DIAGNOSIS — G4710 Hypersomnia, unspecified: Secondary | ICD-10-CM

## 2012-01-15 MED ORDER — TRAZODONE HCL 50 MG PO TABS: ORAL_TABLET | ORAL | Status: DC

## 2012-01-15 MED ORDER — MODAFINIL 200 MG PO TABS: 200.0000 mg | ORAL_TABLET | Freq: Two times a day (BID) | ORAL | Status: DC | PRN

## 2012-01-15 NOTE — Assessment & Plan Note (Addendum)
The patient continues to have daytime sleepiness that is definitely improved with Provigil.  She is now having some issues with prolonged awakenings at night, but is trying to stick with good sleep hygiene and also stimulus control therapy.  I have asked her to consider all the environmental issues, and also whether she may have more symptoms of restless leg syndrome.  I have asked her to continue with her good sleep hygiene and behavioral therapies, and we'll try her on trazodone at bedtime short term to see if we can reset her sleep cycle.  I spent with the pt today discussing her hypersomnia and sleep maintenance issues.

## 2012-01-15 NOTE — Progress Notes (Signed)
  Subjective:    Patient ID: Miranda [name], female    DOB: [birth year], 51 y.o.   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  She has been taking Provigil in order to maintain her functional status during the day, and feels overall she has done well with this.  She is continuing to work on good sleep hygiene, but has had frequent awakenings at night for unknown reasons with difficulty returning to sleep.  This is bothering her daytime alertness.  She is unsure why she awakens, but when she does it may take a while to get back to sleep.  I have reviewed environmental issues with her, and she does not feel they are problem.  She does occasionally have leg discomfort, but is unsure if she kicks during the night.  When she awakens, she feels that her "mind is racing".  She has been trying to work on stimulus control therapy where she does not stay in the bed room if she cannot get back to sleep in a reasonable period of time.   Review of Systems  Constitutional: Negative for fever and unexpected weight change.  HENT: Negative for ear pain, nosebleeds, congestion, sore throat, rhinorrhea, sneezing, trouble swallowing, dental problem, postnasal drip and sinus pressure.   Eyes: Negative for redness and itching.  Respiratory: Negative for cough, chest tightness, shortness of breath and wheezing.   Cardiovascular: Negative for palpitations and leg swelling.  Gastrointestinal: Negative for nausea and vomiting.  Genitourinary: Negative for dysuria.  Musculoskeletal: Negative for joint swelling.  Skin: Negative for rash.  Neurological: Negative for headaches.  Hematological: Does not bruise/bleed easily.  Psychiatric/Behavioral: Positive for disturbed wake/sleep cycle. Negative for dysphoric mood. The patient is not nervous/anxious.   All other systems reviewed and are negative.       Objective:   Physical  Exam Well-developed female in no acute distress Nose without purulence or discharge noted Lower extremities without edema, cyanosis Alert and oriented, moves all 4 extremities.  He does not appear to be overly sleepy.       Assessment & Plan:

## 2012-01-15 NOTE — Patient Instructions (Addendum)
Stay on provigil as needed for your daytime sleepiness. Consider the environmental issues we discussed, and see if they may be contributing to your awakenings. Pay attention to leg symptoms, and whether you may be kicking at night. Will try trazodone 50mg  about an hour before bedtime for next 3-4 weeks to see if we can reset your sleep cycle.  Do not take melatonin while taking this.  Call and let me know how this is going.  Continue behavioral therapies we discussed, and good sleep hygiene.  followup with me in one year

## 2012-05-13 ENCOUNTER — Telehealth: Payer: Self-pay | Admitting: Pulmonary Disease

## 2012-05-13 NOTE — Telephone Encounter (Signed)
Trazodone denied. Note to pharmacist to have pt call our office to discuss.

## 2012-08-09 ENCOUNTER — Other Ambulatory Visit: Payer: Self-pay | Admitting: Pulmonary Disease

## 2012-09-17 ENCOUNTER — Encounter: Payer: Self-pay | Admitting: Certified Nurse Midwife

## 2012-09-21 ENCOUNTER — Encounter: Payer: Self-pay | Admitting: Certified Nurse Midwife

## 2012-09-21 ENCOUNTER — Ambulatory Visit (INDEPENDENT_AMBULATORY_CARE_PROVIDER_SITE_OTHER): Payer: BC Managed Care – PPO | Admitting: Certified Nurse Midwife

## 2012-09-21 VITALS — BP 90/60 | HR 80 | Resp 16

## 2012-09-21 DIAGNOSIS — N951 Menopausal and female climacteric states: Secondary | ICD-10-CM

## 2012-09-21 LAB — FOLLICLE STIMULATING HORMONE: FSH: 82.8 m[IU]/mL

## 2012-09-21 NOTE — Progress Notes (Signed)
52 y.o.SingleCaucasian female complaining of 13days history of vaginal bleeding. She describes it as with malodor at onset and  intermittent.  She  Is currently on no HRT .  She has had previous episodes of questionable perimenopausal/ menopausal bleeding. She has not had any vaginal bleeding since 11/13 which she was evaluated for with PUS which was essentially normal, no endometrial biopsy done.She describes one episode of  discharge in 1-14 but no bleeding. Denies any faintness or dizzy episodes eats well. Associated symptoms are concerns with the bleeding only.   Workup to date: pelvic ultrasound and FSH in 2012 which was70.8.     reports that she has never smoked. She does not have any smokeless tobacco history on file. She reports that she does not drink alcohol or use illicit drugs.  Patient Active Problem List   Diagnosis Date Noted  . 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 HYPERSOMNIA, IDIOPATHIC 07/02/2007  . ALLERGY 05/10/2007    Past Medical History  Diagnosis Date  . Idiopathic hypersomnia   . Allergy, unspecified not elsewhere classified   . IBS (irritable bowel syndrome)   . 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 Shoulder surgery      right  . Tonsillectomy    . Cryotherapy      Current Outpatient Prescriptions  Medication Sig Dispense Refill  . Ibuprofen (ADVIL PO) Take by mouth as needed.      . IRON PO Take by mouth.      . modafinil (PROVIGIL) 200 MG tablet take 1 tablet twice a day if needed  60 tablet  4  . Multiple Vitamins-Minerals (MULTIVITAMIN PO) Take by mouth. occ      . Naproxen Sodium (ALEVE PO) Take by mouth as needed.       No current facility-administered medications for this visit.    [id number] items are noted in HPI.  Exam:    BP 90/60  Pulse 80  Resp 16  LMP 09/06/2012 Hgb.:14.1  General appearance: alert and cooperative Skin: warm and  dry Abdomen: soft, non-tender; bowel sounds normal; no masses,  no organomegaly no inguinal nodes palpated  Pelvic: External genitalia:  no lesions and well estrogenized              Bartholins and Skenes: normal, Bartholin's, Urethra, Skene's normal                 Vagina: normal appearing vagina with normal color and discharge, no lesions, no bleeding noted              Cervix: normal appearance                      Bimanual Exam:  Uterus:  uterus is normal size, shape, consistency and nontender, anteflexed                                      Adnexa:    normal adnexa in size, nontender and no masses                                      Rectovaginal: Confirms  Anus:  normal sphincter tone, no lesions  A: Perimenopausal ?Post menopausal bleeding Normal pelvic exam Evaluation 11/13 with normal PUS thin endometrial stripe No anemia   P: Discussed with patient that perimenopausal can occur with elevated FSH, that menopause is no period in one year. Reassured normal pelvic exam and no anemia present.  Instructed to continue to keep menses exam. Will consult with Dr. Farrel Gobble regarding above findings, to see if any other procedure needed(endo biopsy) Labs: TSH,POCT Hgb, FSH. Patient agreeable to plan.  RV aex, prn              AVS given to patient.    Reviewed, TL

## 2012-09-22 ENCOUNTER — Other Ambulatory Visit: Payer: Self-pay | Admitting: Certified Nurse Midwife

## 2012-10-20 ENCOUNTER — Encounter: Payer: Self-pay | Admitting: Certified Nurse Midwife

## 2012-12-10 ENCOUNTER — Ambulatory Visit: Payer: BC Managed Care – PPO | Admitting: Certified Nurse Midwife

## 2012-12-13 ENCOUNTER — Ambulatory Visit: Payer: BC Managed Care – PPO | Admitting: Certified Nurse Midwife

## 2013-01-10 ENCOUNTER — Ambulatory Visit: Payer: BC Managed Care – PPO | Admitting: Certified Nurse Midwife

## 2013-01-14 ENCOUNTER — Ambulatory Visit: Payer: BC Managed Care – PPO | Admitting: Pulmonary Disease

## 2013-01-17 ENCOUNTER — Ambulatory Visit (INDEPENDENT_AMBULATORY_CARE_PROVIDER_SITE_OTHER): Payer: Commercial Managed Care - PPO | Admitting: Pulmonary Disease

## 2013-01-17 ENCOUNTER — Encounter: Payer: Self-pay | Admitting: Pulmonary Disease

## 2013-01-17 VITALS — BP 108/72 | HR 72 | Temp 98.2°F | Ht 63.0 in | Wt 122.2 lb

## 2013-01-17 DIAGNOSIS — G4710 Hypersomnia, unspecified: Secondary | ICD-10-CM

## 2013-01-17 DIAGNOSIS — G4700 Insomnia, unspecified: Secondary | ICD-10-CM

## 2013-01-17 NOTE — Progress Notes (Signed)
  Subjective:    Patient ID: Miranda [name], female    DOB: September 20, 1960, 52 y.o.   MRN: [id number]  HPI Patient comes in for followup of her daytime hypersomnia.  She has been using Provigil as needed in order to maintain efficiency at work and her quality of life.  She continues to have sleep maintenance issues, and did not see a change with trazodone.  She's been having issues with menopause, and believes this is contributing to her symptoms during the night.   Review of Systems  Constitutional: Negative for fever and unexpected weight change.  HENT: Negative for ear pain, nosebleeds, congestion, sore throat, rhinorrhea, sneezing, trouble swallowing, dental problem, postnasal drip and sinus pressure.   Eyes: Negative for redness and itching.  Respiratory: Negative for cough, chest tightness, shortness of breath and wheezing.   Cardiovascular: Positive for chest pain. Negative for palpitations and leg swelling.  Gastrointestinal: Negative for nausea and vomiting.  Genitourinary: Negative for dysuria.  Musculoskeletal: Negative for joint swelling.  Skin: Negative for rash.  Neurological: Negative for headaches.  Hematological: Does not bruise/bleed easily.  Psychiatric/Behavioral: Negative for dysphoric mood. The patient is not nervous/anxious.        Objective:   Physical Exam Thin female in no acute distress Nose without purulent discharge noted Neck without lymphadenopathy or thyromegaly Lower extremities without edema, no cyanosis Alert, does not appear to be sleepy, moves all 4 extremities.       Assessment & Plan:

## 2013-01-17 NOTE — Assessment & Plan Note (Signed)
Doing well with provigil in order to maintain QOL and be effective at work.  Asked her to continue this, and to keep up with good sleep hygiene.

## 2013-01-17 NOTE — Assessment & Plan Note (Signed)
Continues to be an issue at times.  She believes menopause may be contributing to this.  Also reviewed stimulus control therapy with her again.  May benefit from CBT with psychology.

## 2013-01-17 NOTE — Patient Instructions (Addendum)
Continue provigil as needed during the day.  Will send in new prescription for you. Keep working on sleep hygiene as we discussed. Take melatonin 3mg  4 hrs before bedtime so it won't affect you the next am. followup with me in one year if doing well.

## 2013-02-09 ENCOUNTER — Other Ambulatory Visit: Payer: Self-pay | Admitting: Pulmonary Disease

## 2013-02-14 ENCOUNTER — Ambulatory Visit: Payer: BC Managed Care – PPO | Admitting: Certified Nurse Midwife

## 2013-03-16 ENCOUNTER — Ambulatory Visit (INDEPENDENT_AMBULATORY_CARE_PROVIDER_SITE_OTHER): Payer: Commercial Managed Care - PPO | Admitting: Family Medicine

## 2013-03-16 ENCOUNTER — Encounter: Payer: Self-pay | Admitting: Family Medicine

## 2013-03-16 ENCOUNTER — Ambulatory Visit: Payer: Commercial Managed Care - PPO

## 2013-03-16 VITALS — BP 106/82 | HR 85 | Temp 98.3°F | Resp 18 | Ht 64.0 in | Wt 119.0 lb

## 2013-03-16 DIAGNOSIS — R0602 Shortness of breath: Secondary | ICD-10-CM

## 2013-03-16 DIAGNOSIS — R5381 Other malaise: Secondary | ICD-10-CM

## 2013-03-16 LAB — POCT CBC
Granulocyte percent: 67.7 %G (ref 37–80)
Lymph, poc: 1.5 (ref 0.6–3.4)
MCHC: 31.7 g/dL — AB (ref 31.8–35.4)
MCV: 98.7 fL — AB (ref 80–97)
MID (cbc): 0.4 (ref 0–0.9)
POC Granulocyte: 4.1 (ref 2–6.9)
POC LYMPH PERCENT: 25.4 %L (ref 10–50)
Platelet Count, POC: 178 10*3/uL (ref 142–424)
RDW, POC: 13.1 %

## 2013-03-16 LAB — COMPREHENSIVE METABOLIC PANEL
Alkaline Phosphatase: 84 U/L (ref 39–117)
Creat: 0.98 mg/dL (ref 0.50–1.10)
Glucose, Bld: 86 mg/dL (ref 70–99)
Sodium: 139 mEq/L (ref 135–145)
Total Bilirubin: 0.6 mg/dL (ref 0.3–1.2)
Total Protein: 6.5 g/dL (ref 6.0–8.3)

## 2013-03-16 LAB — POCT URINE PREGNANCY: Preg Test, Ur: NEGATIVE

## 2013-03-16 LAB — GLUCOSE, POCT (MANUAL RESULT ENTRY): POC Glucose: 96 mg/dL (ref 70–99)

## 2013-03-16 LAB — TSH: TSH: 1.584 u[IU]/mL (ref 0.350–4.500)

## 2013-03-16 LAB — D-DIMER, QUANTITATIVE: D-Dimer, Quant: 0.27 ug/mL-FEU (ref 0.00–0.48)

## 2013-03-16 NOTE — Patient Instructions (Signed)
I will give you a call later on today with your D dimer results.

## 2013-03-16 NOTE — Progress Notes (Signed)
[facility] [address], [place] [address] [phone]  Date:  03/16/2013   Name:  Miranda [name]   DOB:  1961/05/18   MRN:  [id number]  PCP:  No PCP Per Patient    Chief Complaint: Nausea and Dizziness   History of Present Illness:  Miranda [name] is a 52 y.o. very pleasant female patient who presents with the following:  Here today with illness.  She has noted that she has not felt quite right over the last couple of weeks. She has felt dizzy and lightheaded at times.  She felt pre- syncopal yesterday for a minute or so but did not pass out.  No vomiting No abdominal pain Her appetite is not quite as good as usual, she has lost a few lbs Wt Readings from Last 3 Encounters:  03/16/13 119 lb (53.978 kg)  01/17/13 122 lb 3.2 oz (55.43 kg)  01/15/12 125 lb 12.8 oz (57.063 kg)   She did feel feverish a couple of times yesterday and the day before.  Over the past weekend she did seem to have a viral infection with cough, HA, ST.  She still has a mild cough.  Cough can be producitve  Taking tylenol- last dose was this am she thinks.   She is generally healthy except for a possible dx of lupus which is currently being evaluated by rheumatology  She has not taken any steroids for at least a year.  She suffers from hypersomnia and takes provigil for daytime sleepiness.  This does seem to help.   She is no longer taking trazadone at bedtime.    Started on HRT about 4 months ago with estrogen and progesterone  She has felt a little SOB- she has noted this for 3 weeks or so.  It can be worse with exertion.   She did have CP a couple of times recently- a momentary sharp pain. Would last maybe 15 seconds.  Non- exertional.  She has noted palpitaitons at times as well.   No history of heart disease,  She has never been a smoker  Patient Active Problem List   Diagnosis Date Noted  . 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 HYPERSOMNIA, IDIOPATHIC 07/02/2007  . ALLERGY 05/10/2007    Past Medical History  Diagnosis Date  . Idiopathic hypersomnia   . Allergy, unspecified not elsewhere classified   . IBS (irritable bowel syndrome)   . 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 Allergy     Past Surgical History  Procedure Laterality Date  . Shoulder surgery      right  . Tonsillectomy    . Cryotherapy      History  Substance Use Topics  . Smoking status: Never Smoker   . Smokeless tobacco: Not on file  . Alcohol Use: No    Family History  Problem Relation Age of Onset  . Adopted: Yes  . Cancer Mother     stomach  . Heart disease Mother   . Heart attack Mother   . Heart disease Father     Allergies  Allergen Reactions  . Keflex [Cephalexin] Itching    Medication list has been reviewed and updated.  Current Outpatient Prescriptions on File Prior to Visit  Medication Sig Dispense Refill  . Ibuprofen (ADVIL PO) Take by mouth as needed.      . modafinil (PROVIGIL) 200 MG tablet take 1 tablet by mouth twice  a day if needed  60 tablet  1  . Multiple Vitamins-Minerals (MULTIVITAMIN PO) Take by mouth. occ      . Naproxen Sodium (ALEVE PO) Take by mouth as needed.       No current facility-administered medications on file prior to visit.    Review of Systems:  As per HPI- otherwise negative. No history of PE, no recent travel, no tobacco.  She did recently start on hormone therapy though.   Physical Examination: Filed Vitals:   03/16/13 0948  BP: 100/64  Pulse: 73  Temp: 98.3 F (36.8 C)  Resp: 18   Filed Vitals:   03/16/13 0948  Height: 5\' 4"  (1.626 m)  Weight: 119 lb (53.978 kg)   Body mass index is 20.42 kg/(m^2). Ideal Body Weight: Weight in (lb) to have BMI = 25: 145.3  GEN: WDWN, NAD, Non-toxic, A & O x 3, looks well.  Accompanied by her long time partner today.  Slim build HEENT: Atraumatic, Normocephalic. Neck supple. No masses, No LAD.  Bilateral TM  wnl, oropharynx normal.  PEERL,EOMI.   Ears and Nose: No external deformity. CV: RRR, No M/G/R. No JVD. No thrill. No extra heart sounds. PULM: CTA B, no wheezes, crackles, rhonchi. No retractions. No resp. distress. No accessory muscle use. ABD: S, NT, ND, +BS. No rebound. No HSM. EXTR: No c/c/e NEURO Normal gait.  PSYCH: Normally interactive. Conversant. Not depressed or anxious appearing.  Calm demeanor.  No calf swelling or tenderness  [facility] reading (PRIMARY) by  Dr. Patsy Lager. CXR: negative CHEST 2 VIEW  COMPARISON: 03/21/2010  FINDINGS: The heart size and mediastinal contours are within normal limits. Both lungs are clear. The visualized skeletal structures are unremarkable.  IMPRESSION: No active cardiopulmonary disease.  EKG: NSR, no ST elelvation or depression.  Read as low voltage- possible pulmonary disease.   Orthostatics: negative  Results for orders placed in visit on 03/16/13  D-DIMER, QUANTITATIVE      Result Value Range   D-Dimer, Quant 0.27  0.00 - 0.48 ug/mL-FEU  POCT CBC      Result Value Range   WBC 6.1  4.6 - 10.2 K/uL   Lymph, poc 1.5  0.6 - 3.4   POC LYMPH PERCENT 25.4  10 - 50 %L   MID (cbc) 0.4  0 - 0.9   POC MID % 6.9  0 - 12 %M   POC Granulocyte 4.1  2 - 6.9   Granulocyte percent 67.7  37 - 80 %G   RBC 4.51  4.04 - 5.48 M/uL   Hemoglobin 14.1  12.2 - 16.2 g/dL   HCT, POC 16.1  09.6 - 47.9 %   MCV 98.7 (*) 80 - 97 fL   MCH, POC 31.3 (*) 27 - 31.2 pg   MCHC 31.7 (*) 31.8 - 35.4 g/dL   RDW, POC 04.5     Platelet Count, POC 178  142 - 424 K/uL   MPV 10.3  0 - 99.8 fL  GLUCOSE, POCT (MANUAL RESULT ENTRY)      Result Value Range   POC Glucose 96  70 - 99 mg/dl  POCT URINE PREGNANCY      Result Value Range   Preg Test, Ur Negative     Assessment and Plan: SOB (shortness of breath) - Plan: EKG 12-Lead, DG Chest 2 View, D-dimer, quantitative  Other malaise and fatigue - Plan: POCT CBC, POCT glucose (manual entry), Comprehensive metabolic  panel, TSH, POCT urine pregnancy  Miranda [name] is here today  with SOB, fatigue and just not feeling that well for a few weeks.  Consider PE as she recently started HRT.  Plant a D dimer today.    Signed Abbe Amsterdam, MD  Called with normal D dimer result.  She is pleased that she will not need a CT scan.  Will follow-up with the rest of her labs when they come in - likely tomorrow.  If any acute change in her sx she will seek care right away.

## 2013-03-17 ENCOUNTER — Telehealth: Payer: Self-pay | Admitting: Family Medicine

## 2013-03-17 NOTE — Telephone Encounter (Signed)
Called and spoke with her.  She is feeling ok.  Went over albs- all normal.  Discussed a possible cardiology evaluation to make sure she does not have occult CAD or an arrythmia.  For now she prefers to wait and see how she feels, but she will keep this in mind and call me if not back to normal soon

## 2013-03-29 ENCOUNTER — Telehealth: Payer: Self-pay

## 2013-03-29 DIAGNOSIS — R0602 Shortness of breath: Secondary | ICD-10-CM

## 2013-03-29 NOTE — Telephone Encounter (Signed)
Patient wants Dr. Patsy Lager to refer her to a cardiologist for a stress test.  Dr. Tresa Endo was mentioned, [facility].   [phone]

## 2013-03-30 NOTE — Telephone Encounter (Signed)
Referral made, called patient to advise. To Dr Annell Greening

## 2013-04-14 ENCOUNTER — Ambulatory Visit (INDEPENDENT_AMBULATORY_CARE_PROVIDER_SITE_OTHER): Payer: Commercial Managed Care - PPO | Admitting: Cardiovascular Disease

## 2013-04-14 ENCOUNTER — Encounter: Payer: Self-pay | Admitting: Cardiovascular Disease

## 2013-04-14 VITALS — BP 100/58 | HR 68 | Ht 63.0 in | Wt 119.2 lb

## 2013-04-14 DIAGNOSIS — R002 Palpitations: Secondary | ICD-10-CM

## 2013-04-14 DIAGNOSIS — R0609 Other forms of dyspnea: Secondary | ICD-10-CM

## 2013-04-14 DIAGNOSIS — R55 Syncope and collapse: Secondary | ICD-10-CM | POA: Insufficient documentation

## 2013-04-14 DIAGNOSIS — R0600 Dyspnea, unspecified: Secondary | ICD-10-CM | POA: Insufficient documentation

## 2013-04-14 NOTE — Progress Notes (Signed)
Thanks for the note. The modafinil has been shown to have almost no cardiac effects since it is not a sympathomimetic based drug.  Unlikely to be the source of her complaint.  Let me know if I can help.

## 2013-04-14 NOTE — Progress Notes (Signed)
Patient ID: Miranda [name], female   DOB: April 25, 1961, 52 y.o.   MRN: [id number]      Reason for office visit Presyncope, palpitations, dyspnea  Miranda [name] is a healthy and physically active 52 year old with recent complaints of dyspnea, rapid palpitations and lightheadedness. She exercises at the gym most days of the week and is very slender and fit. She has noted that off and on she has episodes of shortness of breath since the beginning of the year. Although these episodes sometimes occur during exertion, they're more likely to occur in the morning and have even woken her up from sleep. Most commonly they occur when she is at rest. She has also noticed palpitations. These can happen at rest but one of her most prominent episodes occurred during exercise. She felt suddenly "weird" and checked her heart rate manually and found to be 250 beats per minute. She didn't confirm this with one of the gym machines. Recently she was sitting in her car when she had a sudden episode of lightheadedness associated with disorientation that lasted for just under a minute. The onset and resolution was abrupt.  She does not have a history of any coronary risk factors other than possible coronary disease in her birth mother. She does not know very detailed information about this.  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 She had a negative sleep study. She is followed by Dr. Shelle Iron. She has also been diagnosed with undifferentiated connective tissue disorder. She prefers lifting weights since the treadmill and elliptical causing knee pain.   Allergies  Allergen Reactions  . Keflex [Cephalexin] Itching    Current Outpatient Prescriptions  Medication Sig Dispense Refill  . ESTRADIOL PO Take by mouth.      . Ibuprofen (ADVIL PO) Take by mouth as needed.      . modafinil (PROVIGIL) 200 MG tablet take 1 tablet by mouth twice a day if  needed  60 tablet  1  . Multiple Vitamins-Minerals (MULTIVITAMIN PO) Take by mouth. occ      . Naproxen Sodium (ALEVE PO) Take by mouth as needed.      . progesterone (PROMETRIUM) 100 MG capsule Take 100 mg by mouth daily.       No current facility-administered medications for this visit.    Past Medical History  Diagnosis Date  . Idiopathic hypersomnia   . Allergy, unspecified not elsewhere classified   . IBS (irritable bowel syndrome)   . 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 Allergy     Past Surgical History  Procedure Laterality Date  . Shoulder surgery      right  . Tonsillectomy    . Cryotherapy      Family History  Problem Relation Age of Onset  . Adopted: Yes  . Cancer Mother     stomach  . Heart disease Mother   . Heart attack Mother   . Heart disease Father     History   Social History  . Marital Status: Single    Spouse Name: N/A    Number of Children: N/A  . Years of Education: N/A   Occupational History  . Not on file.   Social History Main Topics  . Smoking status: Never Smoker   . Smokeless tobacco: Not on file  . Alcohol Use: No  . Drug Use: No  . Sexual Activity: Yes    Partners: Female  Birth Control/ Protection: None   Other Topics Concern  . Not on file   Social History Narrative  . No narrative on file    Review of systems: The patient specifically deniesorthopnea, paroxysmal nocturnal dyspnea, full syncope, focal neurological deficits, intermittent claudication, lower extremity edema, unexplained weight gain, cough, hemoptysis or wheezing.  The patient also denies abdominal pain, nausea, vomiting, dysphagia, diarrhea, constipation, polyuria, polydipsia, dysuria, hematuria, frequency, urgency, abnormal bleeding or bruising, fever, chills, unexpected weight changes, mood swings, change in skin or hair texture, change in voice quality, auditory or visual problems, allergic reactions or rashes, new musculoskeletal  complaints other than usual "aches and pains".   PHYSICAL EXAM BP 100/58  Pulse 68  Ht 5\' 3"  (1.6 m)  Wt 119 lb 3.2 oz (54.069 kg)  BMI 21.12 kg/m2  LMP 09/06/2012  General: Alert, oriented x3, no distress Head: no evidence of trauma, PERRL, EOMI, no exophtalmos or lid lag, no myxedema, no xanthelasma; normal ears, nose and oropharynx Neck: normal jugular venous pulsations and no hepatojugular reflux; brisk carotid pulses without delay and no carotid bruits Chest: clear to auscultation, no signs of consolidation by percussion or palpation, normal fremitus, symmetrical and full respiratory excursions Cardiovascular: normal position and quality of the apical impulse, regular rhythm, normal first and second heart sounds, no murmurs, rubs or gallops Abdomen: no tenderness or distention, no masses by palpation, no abnormal pulsatility or arterial bruits, normal bowel sounds, no hepatosplenomegaly Extremities: no clubbing, cyanosis or edema; 2+ radial, ulnar and brachial pulses bilaterally; 2+ right femoral, posterior tibial and dorsalis pedis pulses; 2+ left femoral, posterior tibial and dorsalis pedis pulses; no subclavian or femoral bruits Neurological: grossly nonfocal   EKG: Normal sinus rhythm (performed in October)  Lipid Panel     Component Value Date/Time   CHOL 158 07/21/2006 0952   TRIG 68 07/21/2006 0952   HDL 66.2 07/21/2006 0952   CHOLHDL 2.4 CALC 07/21/2006 0952   VLDL 14 07/21/2006 0952   LDLCALC 78 07/21/2006 0952    BMET    Component Value Date/Time   NA 139 03/16/2013 1043   K 4.2 03/16/2013 1043   CL 104 03/16/2013 1043   CO2 29 03/16/2013 1043   GLUCOSE 86 03/16/2013 1043   BUN 21 03/16/2013 1043   CREATININE 0.98 03/16/2013 1043   CREATININE 1.1 07/21/2006 0952   CALCIUM 9.5 03/16/2013 1043   GFRNONAA 57 07/21/2006 0952   GFRAA 69 07/21/2006 0952     ASSESSMENT AND PLAN The abrupt onset and termination of her symptoms and a random pattern are consistent with  cardiac arrhythmia. If her heart rate of 250 was indeed accurate it is clearly not a physiological rhythms such as sinus tachycardia. The symptoms unfortunately occurs sporadically and not on a daily basis. I recommended that she wear a 14 day event monitor. I think would also be useful to screen for structural heart problems with an echocardiogram since dyspnea is one of her more prominent complaints. After she undergoes the echo and wear the event monitor we'll meet back in the office to review the findings. If we need to we might go ahead and also do a treadmill stress test to induce the arrhythmia, if we don't catch it on her monitor.  She takes a stimulants that might increase her likelihood of cardiac arrhythmia, but has been on this medication for much longer than the duration of her complaint.  No orders of the defined types were placed in this encounter.   No  orders of the defined types were placed in this encounter.    Holli Humbles, MD, [facility] [phone] office [phone] pager

## 2013-04-14 NOTE — Patient Instructions (Signed)
Your physician has requested that you have an echocardiogram. Echocardiography is a painless test that uses sound waves to create images of your heart. It provides your doctor with information about the size and shape of your heart and how well your heart's chambers and valves are working. This procedure takes approximately one hour. There are no restrictions for this procedure.  Your physician has recommended that you wear an event monitor. Event monitors are medical devices that record the heart's electrical activity. Doctors most often [place] these monitors to diagnose arrhythmias. Arrhythmias are problems with the speed or rhythm of the heartbeat. The monitor is a small, portable device. You can wear one while you do your normal daily activities. This is usually used to diagnose what is causing palpitations/syncope (passing out).  Your physician recommends that you schedule a follow-up appointment in: 4 weeks

## 2013-04-18 ENCOUNTER — Other Ambulatory Visit: Payer: Self-pay | Admitting: Cardiovascular Disease

## 2013-04-18 DIAGNOSIS — R002 Palpitations: Secondary | ICD-10-CM

## 2013-04-18 DIAGNOSIS — R0602 Shortness of breath: Secondary | ICD-10-CM

## 2013-05-03 ENCOUNTER — Ambulatory Visit (HOSPITAL_COMMUNITY)
Admission: RE | Admit: 2013-05-03 | Discharge: 2013-05-03 | Disposition: A | Discharge status: Home / Self Care | Payer: Commercial Managed Care - PPO | Source: Ambulatory Visit | Attending: Cardiovascular Disease | Admitting: Cardiovascular Disease

## 2013-05-03 DIAGNOSIS — R55 Syncope and collapse: Secondary | ICD-10-CM | POA: Insufficient documentation

## 2013-05-03 DIAGNOSIS — R002 Palpitations: Secondary | ICD-10-CM | POA: Insufficient documentation

## 2013-05-03 DIAGNOSIS — R0609 Other forms of dyspnea: Secondary | ICD-10-CM | POA: Insufficient documentation

## 2013-05-03 DIAGNOSIS — R0989 Other specified symptoms and signs involving the circulatory and respiratory systems: Secondary | ICD-10-CM | POA: Insufficient documentation

## 2013-05-03 DIAGNOSIS — R0602 Shortness of breath: Secondary | ICD-10-CM

## 2013-05-03 NOTE — Progress Notes (Signed)
2D Echo Performed 05/03/2013    Nurah Petrides, RCS  

## 2013-05-04 NOTE — Progress Notes (Signed)
[facility] 05/04/13 8:45AM

## 2013-05-11 ENCOUNTER — Encounter: Payer: Self-pay | Admitting: Cardiovascular Disease

## 2013-05-12 ENCOUNTER — Encounter: Payer: Self-pay | Admitting: Cardiovascular Disease

## 2013-05-12 ENCOUNTER — Ambulatory Visit (INDEPENDENT_AMBULATORY_CARE_PROVIDER_SITE_OTHER): Payer: Commercial Managed Care - PPO | Admitting: Cardiovascular Disease

## 2013-05-12 VITALS — BP 100/60 | HR 82 | Ht 64.0 in | Wt 122.9 lb

## 2013-05-12 DIAGNOSIS — R002 Palpitations: Secondary | ICD-10-CM

## 2013-05-12 NOTE — Progress Notes (Signed)
Patient ID: Miranda [name], female   DOB: Dec 05, 1960, 52 y.o.   MRN: [id number]      Reason for office visit Followup after echocardiogram and event monitor  Miranda [name] is generally feeling well. She has not had any more of the unusual episodes when he feels briefly disoriented. She did have several episodes of racing heartbeat while she was wearing the event monitor but the tracings consistently showed only sinus tachycardia. Note for arrhythmia was seen.  Her echocardiogram shows a structurally healthy heart with normal left ventricular function.   Allergies  Allergen Reactions  . Keflex [Cephalexin] Itching    Current Outpatient Prescriptions  Medication Sig Dispense Refill  . ESTRADIOL PO Take by mouth.      . Ibuprofen (ADVIL PO) Take by mouth as needed.      . modafinil (PROVIGIL) 200 MG tablet take 1 tablet by mouth twice a day if needed  60 tablet  1  . Multiple Vitamins-Minerals (MULTIVITAMIN PO) Take by mouth. occ      . Naproxen Sodium (ALEVE PO) Take by mouth as needed.      . progesterone (PROMETRIUM) 100 MG capsule Take 100 mg by mouth daily.       No current facility-administered medications for this visit.    Past Medical History  Diagnosis Date  . Idiopathic hypersomnia   . Allergy, unspecified not elsewhere classified   . IBS (irritable bowel syndrome)   . 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 Allergy     Past Surgical History  Procedure Laterality Date  . Shoulder surgery      right  . Tonsillectomy    . Cryotherapy      Family History  Problem Relation Age of Onset  . Adopted: Yes  . Cancer Mother     stomach  . Heart disease Mother   . Heart attack Mother   . Heart disease Father     History   Social History  . Marital Status: Single    Spouse Name: N/A    Number of Children: N/A  . Years of Education: N/A   Occupational History  . Not on file.   Social History Main Topics  . Smoking status: Never Smoker   .  Smokeless tobacco: Not on file  . Alcohol Use: No  . Drug Use: No  . Sexual Activity: Yes    Partners: Female    Pharmacist, hospital Protection: None   Other Topics Concern  . Not on file   Social History Narrative  . No narrative on file    Review of systems: The patient specifically deniesorthopnea, paroxysmal nocturnal dyspnea, full syncope, focal neurological deficits, intermittent claudication, lower extremity edema, unexplained weight gain, cough, hemoptysis or wheezing.  The patient also denies abdominal pain, nausea, vomiting, dysphagia, diarrhea, constipation, polyuria, polydipsia, dysuria, hematuria, frequency, urgency, abnormal bleeding or bruising, fever, chills, unexpected weight changes, mood swings, change in skin or hair texture, change in voice quality, auditory or visual problems, allergic reactions or rashes, new musculoskeletal complaints other than usual "aches and pains".     PHYSICAL EXAM BP 100/60  Pulse 82  Ht 5\' 4"  (1.626 m)  Wt 122 lb 14.4 oz (55.747 kg)  BMI 21.09 kg/m2  LMP 09/06/2012  General: Alert, oriented x3, no distress  Head: no evidence of trauma, PERRL, EOMI, no exophtalmos or lid lag, no myxedema, no xanthelasma; normal ears, nose and oropharynx  Neck: normal jugular venous pulsations and no hepatojugular reflux;  brisk carotid pulses without delay and no carotid bruits  Chest: clear to auscultation, no signs of consolidation by percussion or palpation, normal fremitus, symmetrical and full respiratory excursions  Cardiovascular: normal position and quality of the apical impulse, regular rhythm, normal first and second heart sounds, no murmurs, rubs or gallops  Abdomen: no tenderness or distention, no masses by palpation, no abnormal pulsatility or arterial bruits, normal bowel sounds, no hepatosplenomegaly  Extremities: no clubbing, cyanosis or edema; 2+ radial, ulnar and brachial pulses bilaterally; 2+ right femoral, posterior tibial and dorsalis  pedis pulses; 2+ left femoral, posterior tibial and dorsalis pedis pulses; no subclavian or femoral bruits  Neurological: grossly nonfocal   EKG: Normal sinus rhythm   Lipid Panel     Component Value Date/Time   CHOL 158 07/21/2006 0952   TRIG 68 07/21/2006 0952   HDL 66.2 07/21/2006 0952   CHOLHDL 2.4 CALC 07/21/2006 0952   VLDL 14 07/21/2006 0952   LDLCALC 78 07/21/2006 0952    BMET    Component Value Date/Time   NA 139 03/16/2013 1043   K 4.2 03/16/2013 1043   CL 104 03/16/2013 1043   CO2 29 03/16/2013 1043   GLUCOSE 86 03/16/2013 1043   BUN 21 03/16/2013 1043   CREATININE 0.98 03/16/2013 1043   CREATININE 1.1 07/21/2006 0952   CALCIUM 9.5 03/16/2013 1043   GFRNONAA 57 07/21/2006 0952   GFRAA 69 07/21/2006 0952     ASSESSMENT AND PLAN Palpitations The event monitor shows that her palpitations were consistently associated with mild to moderate sinus tachycardia. No true arrhythmia has been recorded. I don't think medical therapy is indicated. She appears to have a structurally healthy heart is very fit. It is conceivable that the Nuvigil may be playing some role in her tendency towards tachycardia, but I do not think that this medication necessarily should be stopped. She'll followup if needed for worsening symptoms or new complaints.  If her symptoms return or worsen, we will have a better chance of catching atrial arrhythmia with implantable loop recorder. At this point her symptoms are mild and I do not think an invasive there is indicated.  Miranda Silk, MD, [facility] [phone] office [phone] pager

## 2013-05-12 NOTE — Assessment & Plan Note (Signed)
The event monitor shows that her palpitations were consistently associated with mild to moderate sinus tachycardia. No true arrhythmia has been recorded. I don't think medical therapy is indicated. She appears to have a structurally healthy heart is very fit. It is conceivable that the Nuvigil may be playing some role in her tendency towards tachycardia, but I do not think that this medication necessarily should be stopped. She'll followup if needed for worsening symptoms or new complaints.

## 2013-05-12 NOTE — Patient Instructions (Signed)
Your physician recommends that you schedule a follow-up appointment in: AS NEEDED  

## 2013-06-17 ENCOUNTER — Other Ambulatory Visit: Payer: Self-pay | Admitting: Pulmonary Disease

## 2013-09-09 ENCOUNTER — Other Ambulatory Visit: Payer: Self-pay

## 2013-09-09 DIAGNOSIS — Z1231 Encounter for screening mammogram for malignant neoplasm of breast: Secondary | ICD-10-CM

## 2013-09-13 ENCOUNTER — Encounter: Payer: Self-pay | Admitting: Certified Nurse Midwife

## 2013-09-13 ENCOUNTER — Ambulatory Visit (INDEPENDENT_AMBULATORY_CARE_PROVIDER_SITE_OTHER): Payer: Commercial Managed Care - PPO | Admitting: Certified Nurse Midwife

## 2013-09-13 VITALS — BP 110/68 | HR 68 | Resp 16 | Ht 63.25 in | Wt 119.0 lb

## 2013-09-13 DIAGNOSIS — Z0000 Encounter for general adult medical examination without abnormal findings: Secondary | ICD-10-CM

## 2013-09-13 DIAGNOSIS — Z1211 Encounter for screening for malignant neoplasm of colon: Secondary | ICD-10-CM

## 2013-09-13 DIAGNOSIS — Z01419 Encounter for gynecological examination (general) (routine) without abnormal findings: Secondary | ICD-10-CM

## 2013-09-13 NOTE — Patient Instructions (Signed)

## 2013-09-13 NOTE — Progress Notes (Signed)
53 y.o. G0P0000 Single Caucasian Fe here for annual exam. Menopausal on HRT, out of Rx desires continuance. Denies vaginal bleeding or dryness. Sees PCP for medication management, labs. No health issues today.  Patient's last menstrual period was 09/06/2012.          Sexually active: yes  The current method of family planning is none,female partner.    Exercising: yes  weight lifting Smoker:  no  Health Maintenance: Pap:  12-10-11 neg MMG:  05-28-11 normal Colonoscopy:  Early 90's BMD:   none TDaP:  2010 Labs: none Self breast exam: not done   reports that she has never smoked. She does not have any smokeless tobacco history on file. She reports that she does not drink alcohol or use illicit drugs.  Past Medical History  Diagnosis Date  . Idiopathic hypersomnia   . Allergy, unspecified not elsewhere classified   . IBS (irritable bowel syndrome)   . 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 Allergy   . Abnormal Pap smear of cervix 1996    Past Surgical History  Procedure Laterality Date  . Shoulder surgery      right  . Tonsillectomy    . Cryotherapy      Current Outpatient Prescriptions  Medication Sig Dispense Refill  . estradiol (ESTRACE) 0.5 MG tablet Take 0.5 mg by mouth daily.      . Ibuprofen (ADVIL PO) Take by mouth as needed.      . modafinil (PROVIGIL) 200 MG tablet TAKE 1 TABLET BY MOUTH TWICE DAILY AS NEEDED  60 tablet  5  . Multiple Vitamins-Minerals (MULTIVITAMIN PO) Take by mouth. occ      . Naproxen Sodium (ALEVE PO) Take by mouth as needed.      . progesterone (PROMETRIUM) 100 MG capsule Take 100 mg by mouth daily.       No current facility-administered medications for this visit.    Family History  Problem Relation Age of Onset  . Adopted: Yes  . Cancer Mother     stomach  . Heart disease Mother   . Heart attack Mother   . Heart disease Father     ROS:  Pertinent items are noted in HPI.  Otherwise, a comprehensive ROS was  negative.  Exam:   BP 110/68  Pulse 68  Resp 16  Ht 5' 3.25" (1.607 m)  Wt 119 lb (53.978 kg)  BMI 20.90 kg/m2  LMP 09/06/2012 Height: 5' 3.25" (160.7 cm)  Ht Readings from Last 3 Encounters:  09/13/13 5' 3.25" (1.607 m)  05/12/13 5\' 4"  (1.626 m)  04/14/13 5\' 3"  (1.6 m)    General appearance: alert, cooperative and appears stated age Head: Normocephalic, without obvious abnormality, atraumatic Neck: no adenopathy, supple, symmetrical, trachea midline and thyroid normal to inspection and palpation Lungs: clear to auscultation bilaterally Breasts: normal appearance, no masses or tenderness, No nipple retraction or dimpling, No nipple discharge or bleeding, No axillary or supraclavicular adenopathy Heart: regular rate and rhythm Abdomen: soft, non-tender; no masses,  no organomegaly Extremities: extremities normal, atraumatic, no cyanosis or edema Skin: Skin color, texture, turgor normal. No rashes or lesions Lymph nodes: Cervical, supraclavicular, and axillary nodes normal. No abnormal inguinal nodes palpated Neurologic: Grossly normal   Pelvic: External genitalia:  no lesions              Urethra:  normal appearing urethra with no masses, tenderness or lesions  Bartholin's and Skene's: normal                 Vagina: normal appearing vagina with normal color and discharge, no lesions              Cervix: normal, non tender              Pap taken: yes Bimanual Exam:  Uterus:  normal size, contour, position, consistency, mobility, non-tender and anteverted              Adnexa: normal adnexa and no mass, fullness, tenderness               Rectovaginal: Confirms               Anus:  normal sphincter tone, no lesions  A:  Well Woman with normal exam  Menopausal on HRT working well  History of RA not on medication now  Mammogram due, last one 2013  Colonoscopy due  P:   Reviewed health and wellness pertinent to exam  Aware of need to evaluate if vaginal bleeding,  Discussed needs mammogram results before refill on HRT, patient given number to call and will schedule.  Continue follow up as indicated  Aware of risks/benefits of colonoscopy and declines scheduling at this time, IFOB dispensed  Pap smear taken today  Mammogram yearly stressed pap smear taken today with HPVHR  counseled on breast self exam, mammography screening, use and side effects of HRT, adequate intake of calcium and vitamin D, diet and exercise  return annually or prn  An After Visit Summary was printed and given to the patient.

## 2013-09-15 LAB — IPS PAP TEST WITH HPV

## 2013-09-16 ENCOUNTER — Ambulatory Visit
Admission: RE | Admit: 2013-09-16 | Discharge: 2013-09-16 | Disposition: A | Discharge status: Home / Self Care | Payer: Self-pay | Source: Ambulatory Visit

## 2013-09-16 DIAGNOSIS — Z1231 Encounter for screening mammogram for malignant neoplasm of breast: Secondary | ICD-10-CM

## 2013-09-16 NOTE — Progress Notes (Signed)
Reviewed personally.  M. Suzanne Chaquetta Schlottman, MD.  

## 2013-09-20 ENCOUNTER — Telehealth: Payer: Self-pay | Admitting: Certified Nurse Midwife

## 2013-09-20 MED ORDER — ESTRADIOL 0.5 MG PO TABS: 0.5000 mg | ORAL_TABLET | Freq: Every day | ORAL | Status: DC

## 2013-09-20 MED ORDER — PROGESTERONE MICRONIZED 100 MG PO CAPS: 100.0000 mg | ORAL_CAPSULE | Freq: Every day | ORAL | Status: DC

## 2013-09-20 NOTE — Telephone Encounter (Signed)
Patient is calling to see if we have gotten results from mammogram because she needs the refill for her estradiol  and progesterone

## 2013-09-20 NOTE — Telephone Encounter (Signed)
Mammogram results from 4/27 in EPIC. Mammogram read as  FINDINGS:  There are no findings suspicious for malignancy. Images were  processed with CAD.  RECOMMENDATION:  Screening mammogram in one year.  Estradiol 0.5mg  take one tablet daily #30 with 11 refills and Progesterone 100 mg take one capsule daily #30 with 11 refills sent to pharmacy of choice. Spoke with patient. Advised refills sent. Patient agreeable and verbalizes understanding. Will call back with any further needs, questions, or concerns.  Routing to provider for final review. Patient agreeable to disposition. Will close encounter.

## 2013-09-27 NOTE — Addendum Note (Signed)
Addended by: Verner CholLEONARD, Caison Hearn S on: 09/27/2013 09:32 AM   Modules accepted: Orders

## 2013-12-06 ENCOUNTER — Ambulatory Visit: Payer: Commercial Managed Care - PPO

## 2014-01-11 ENCOUNTER — Encounter (INDEPENDENT_AMBULATORY_CARE_PROVIDER_SITE_OTHER): Payer: Self-pay

## 2014-01-11 ENCOUNTER — Encounter: Payer: Self-pay | Admitting: Pulmonary Disease

## 2014-01-11 ENCOUNTER — Ambulatory Visit (INDEPENDENT_AMBULATORY_CARE_PROVIDER_SITE_OTHER): Payer: Commercial Managed Care - PPO | Admitting: Pulmonary Disease

## 2014-01-11 VITALS — BP 98/62 | HR 71 | Temp 98.1°F | Ht 64.0 in | Wt 124.0 lb

## 2014-01-11 DIAGNOSIS — G4710 Hypersomnia, unspecified: Secondary | ICD-10-CM

## 2014-01-11 MED ORDER — ARMODAFINIL 250 MG PO TABS: 250.0000 mg | ORAL_TABLET | Freq: Every day | ORAL | Status: DC

## 2014-01-11 NOTE — Assessment & Plan Note (Signed)
The patient is having a little bit more sleepiness during the day despite taking Provigil twice a day. She has been maintaining good sleep hygiene at night. I would like to try her on Nuvigil, and if she sees no difference, would consider one of the other sympathomimetics medications. She is to let me know how she is doing.

## 2014-01-11 NOTE — Progress Notes (Signed)
   Subjective:    Patient ID: Miranda [name], female    DOB: Oct 15, 1960, 53 y.o.   MRN: [id number]  HPI Patient comes in today for followup of her idiopathic hypersomnia. She has been on Provigil twice a day, but feels that her cyst sleepiness has been getting a little worse. She gets to bed at a reasonable hour, and although she does have some awakenings at night, and overall she feels that she sleeps and adequate quantity. He feels that her sleepiness is starting to impact her quality of life a little more   Review of Systems  Constitutional: Negative for fever and unexpected weight change.  HENT: Negative for congestion, dental problem, ear pain, nosebleeds, postnasal drip, rhinorrhea, sinus pressure, sneezing, sore throat and trouble swallowing.   Eyes: Negative for redness and itching.  Respiratory: Negative for cough, chest tightness, shortness of breath and wheezing.   Cardiovascular: Negative for palpitations and leg swelling.  Gastrointestinal: Negative for nausea and vomiting.  Genitourinary: Negative for dysuria.  Musculoskeletal: Negative for joint swelling.  Skin: Negative for rash.  Neurological: Negative for headaches.  Hematological: Does not bruise/bleed easily.  Psychiatric/Behavioral: Negative for dysphoric mood. The patient is not nervous/anxious.        Objective:   Physical Exam Well-developed female in no acute distress Nose without purulence or discharge noted Neck without lymphadenopathy or thyromegaly Lower extremities without edema, no cyanosis Alert and oriented, moves all 4 extremities.       Assessment & Plan:

## 2014-01-11 NOTE — Patient Instructions (Signed)
Stop provigil for now, and will try nuvigil 250mg  each am for next month.  Give me some feedback with how this is going. If you are not doing well, we can try a different category of medications as we discussed.  Let me know. Continue with good sleep hygiene. followup with me again in one year if doing well.

## 2014-01-17 ENCOUNTER — Ambulatory Visit: Payer: Commercial Managed Care - PPO | Admitting: Pulmonary Disease

## 2014-01-25 ENCOUNTER — Encounter: Payer: Self-pay | Admitting: Pulmonary Disease

## 2014-01-25 ENCOUNTER — Telehealth: Payer: Self-pay | Admitting: Pulmonary Disease

## 2014-01-25 NOTE — Telephone Encounter (Signed)
Per 01/11/14; Patient Instructions      Stop provigil for now, and will try nuvigil  each am for next month.  Give me some feedback with how this is going. If you are not doing well, we can try a different category of medications as we discussed. Let me know. Continue with good sleep hygiene. followup with me again in one year if doing w   Called spoke with pt. She reports the nuvigil worked a little better than the Provigil. Just very slight difference. She wants to know what other alternatives are that she can try that would be more beneficial. Please advise Dr. Shelle Iron thanks  Allergies  Allergen Reactions  . Keflex [Cephalexin] Itching

## 2014-01-25 NOTE — Telephone Encounter (Signed)
I called spoke with Miranda [name]. She is aware of KC response. She is willing to try the new medication and is willing to come monthly to pick RX up if it helps. Please advise KC thanks

## 2014-01-25 NOTE — Telephone Encounter (Signed)
We can try one of the other stimulant medications, but as we discussed can potentially increase heart rate and BP.  I think it is worth a try though and see how she tolerates.  Let me know if she is ok with this.  Will need to come by to pick up prescription monthly though.  Let her know that as well.

## 2014-01-26 MED ORDER — LISDEXAMFETAMINE DIMESYLATE 30 MG PO CAPS: 30.0000 mg | ORAL_CAPSULE | Freq: Every day | ORAL | Status: DC

## 2014-01-26 NOTE — Telephone Encounter (Signed)
Spoke with pt and advised that rx will be left at front for pick up.

## 2014-01-26 NOTE — Telephone Encounter (Signed)
Can try vyvanse  one each am, #30, no fills.  Give [place] update with how things are going at the end of 30 days.

## 2014-02-20 ENCOUNTER — Encounter: Payer: Self-pay | Admitting: Pulmonary Disease

## 2014-03-02 ENCOUNTER — Encounter: Payer: Self-pay | Admitting: Pulmonary Disease

## 2014-03-02 NOTE — Telephone Encounter (Signed)
KC please advise on refill.

## 2014-03-03 MED ORDER — ARMODAFINIL 250 MG PO TABS: 250.0000 mg | ORAL_TABLET | Freq: Every day | ORAL | Status: DC

## 2014-03-03 NOTE — Telephone Encounter (Signed)
Ok to fill her nuvigil if it is working well for her.

## 2014-03-03 NOTE — Telephone Encounter (Signed)
Refill sent. Jennifer Castillo, CMA  

## 2014-03-27 ENCOUNTER — Encounter: Payer: Self-pay | Admitting: Pulmonary Disease

## 2014-05-21 ENCOUNTER — Other Ambulatory Visit: Payer: Self-pay | Admitting: Pulmonary Disease

## 2014-05-23 ENCOUNTER — Other Ambulatory Visit: Payer: Self-pay | Admitting: Pulmonary Disease

## 2014-05-26 ENCOUNTER — Encounter: Payer: Self-pay | Admitting: Pulmonary Disease

## 2014-06-28 ENCOUNTER — Encounter: Payer: Self-pay | Admitting: Pulmonary Disease

## 2014-06-28 DIAGNOSIS — R9389 Abnormal findings on diagnostic imaging of other specified body structures: Secondary | ICD-10-CM

## 2014-06-29 ENCOUNTER — Telehealth: Payer: Self-pay | Admitting: Pulmonary Disease

## 2014-06-29 NOTE — Telephone Encounter (Signed)
Pt advice sent to pt

## 2014-06-29 NOTE — Addendum Note (Signed)
Addended by: Tommie SamsSILVA, MINDY S on: 06/29/2014 03:07 PM   Modules accepted: Orders

## 2014-06-29 NOTE — Telephone Encounter (Signed)
Ok to refer her to [name]. ?autoimmune disease, abnormal MRI head I think she has had rheum w/u at [facility], and would be good for pt to get records and take with her to visit.

## 2014-06-29 NOTE — Telephone Encounter (Signed)
KC - please advise if you are okay with sending a referral for this pt. Thanks.

## 2014-06-29 NOTE — Telephone Encounter (Signed)
Ok to refer her to Miranda [name]. ?autoimmune disease, abnormal MRI head I think she has had rheum w/u at [facility], and would be good for pt to get records and take with her to visit. 

## 2014-07-05 ENCOUNTER — Telehealth: Payer: Self-pay | Admitting: Pulmonary Disease

## 2014-07-05 DIAGNOSIS — D8989 Other specified disorders involving the immune mechanism, not elsewhere classified: Secondary | ICD-10-CM

## 2014-07-05 NOTE — Telephone Encounter (Signed)
KC please advise. Are you okay with making this referral? Thanks.

## 2014-07-05 NOTE — Telephone Encounter (Signed)
She has already seen neuro in the past.

## 2014-07-05 NOTE — Telephone Encounter (Signed)
Omega from dr. Nickola MajorHawkes office called and states that dr Nickola Majorhawkes reviewed records and feels like pt should be referred to neurology. If dr clance is ok with this. Please place referral for neurology and send to [facility]  -Rhonda   CB number [phone]

## 2014-07-05 NOTE — Telephone Encounter (Signed)
atc Omega at Dr. Nickola MajorHawkes' office but office is closed.  Will leave in triage until tomorrow morning.

## 2014-07-06 NOTE — Telephone Encounter (Signed)
Called and spoke to Omega and informed her of the recs per [facility]. Omega verbalized understanding and stated the info should be relayed to [facility]. Omega transferred me to a voicemail. LMTCB for [name].

## 2014-07-10 NOTE — Telephone Encounter (Signed)
Called and was on hold for more than 10 minutes. Will need to ask Omega if anything further is needed.

## 2014-07-11 NOTE — Telephone Encounter (Signed)
LMTCB x 2 for Miranda BattenKim

## 2014-07-12 NOTE — Telephone Encounter (Signed)
[place] and spoke to [place] Selena BattenKim is requesting the notes from Neurology and to have our referral notes and records re-faxed to their office at [phone]. OV note, phone note from 06/29/2014, insurance card, and referral faxed to provided number.   lmtcb for pt to see when and where she was seen by [facility].

## 2014-07-12 NOTE — Telephone Encounter (Signed)
Return call.Stanley A Dalton °

## 2014-07-12 NOTE — Telephone Encounter (Signed)
Pt calling stating that [facility] to [facility]ology and that she had her MRI @ [facility] which is now [facility].Caren GriffinsStanley A Dalton

## 2014-07-12 NOTE — Telephone Encounter (Signed)
Called patient, aware that everything faxed for referral except for old Neuro notes. Advised pt that I would call and inquire about getting records.  Called [facility], spoke with Maxine GlennMonica - confirmed that pt was seen there and that records are in storage. I was transferred to medical records LM with Medical records x 1 to request records.

## 2014-07-14 NOTE — Telephone Encounter (Signed)
[name]. Pt was seen by Dr. Anne HahnWillis in 2008. Note are to be faxed to triage fax. Called [facility] at [phone] to have MRI faxed to triage fax.   Will await [name] notes to be faxed and will then call [name].

## 2014-07-17 ENCOUNTER — Telehealth: Payer: Self-pay | Admitting: Pulmonary Disease

## 2014-07-17 DIAGNOSIS — D8989 Other specified disorders involving the immune mechanism, not elsewhere classified: Secondary | ICD-10-CM

## 2014-07-17 NOTE — Telephone Encounter (Signed)
Please refer to Dr. Dierdre ForthBeekman or Dareen PianoAnderson for autoimmune evaluation.

## 2014-07-17 NOTE — Telephone Encounter (Signed)
Please advise Mindy if you have received any notes this morning from [facility] regarding MRI? Thanks.

## 2014-07-17 NOTE — Telephone Encounter (Signed)
Called and spoke with Omega with Dr. Nickola MajorHawkes office. Neurology records have been received per Omega. Dr. Nickola MajorHawkes reviewed these records and Dr. Nickola MajorHawkes will still not agree to see patient.  Please advise. Rhonda J Cobb

## 2014-07-17 NOTE — Telephone Encounter (Signed)
This was in error. I will fix it!!

## 2014-07-17 NOTE — Telephone Encounter (Signed)
Order has been placed.

## 2014-07-17 NOTE — Telephone Encounter (Signed)
There is order in my box or referral to immunology.  It is supposed to be rheumatology!! Either beekman or D[facility]

## 2014-07-21 ENCOUNTER — Telehealth: Payer: Self-pay | Admitting: Pulmonary Disease

## 2014-07-21 DIAGNOSIS — Z7689 Persons encountering health services in other specified circumstances: Secondary | ICD-10-CM

## 2014-07-21 NOTE — Telephone Encounter (Signed)
Libby please advise.  

## 2014-07-21 NOTE — Telephone Encounter (Signed)
[facility] to Dr. Corliss Skainseveshwar

## 2014-07-21 NOTE — Telephone Encounter (Signed)
Spoke with pt, states that the rheumatology office we referred her to is [facility], but patient isn't wanting to drive that far.  States that the rheumatologist office was going to be calling KC to make him aware of why they [facility]  Pt states she [place] to [place] [facility] and wants to be seen in [place].  KC please advise if you have heard from Dr. Dierdre ForthBeekman or Dareen PianoAnderson?  Thank you!

## 2014-07-24 NOTE — Telephone Encounter (Signed)
Both meds we referred her to declined her so dr clance asked us to send it to dr Neomia Deardeveswshar so records have been faxed to her waiting on a response from that office Tobe SosSally E Ottinger

## 2014-07-27 NOTE — Telephone Encounter (Signed)
Called to check on status of referral with Dr. Corliss Skainseveshwar, no appointment has been scheduled yet. Office did say they were behind on some of their referrals, but that she would have someone check on the status and return my call. Rhonda J Cobb

## 2014-08-01 NOTE — Telephone Encounter (Signed)
Bjorn LoserRhonda what is the status of this? Thanks.

## 2014-08-01 NOTE — Telephone Encounter (Signed)
Miranda [name], let her know that Dr. Kriste BasqueNadel was her primary doctor, and he no longer is doing this. I have no idea why rheumatology is not willing to see her.  I think the best way might be to start with a primary doctor, then go from there.

## 2014-08-01 NOTE — Telephone Encounter (Signed)
Referral faxed to Dr. Corliss Skainseveshwar, however, Dr. Corliss Skainseveshwar has declined referral stating that pt should stay with [facility] or consider other tertiary care center. Please advise. Rhonda J Cobb

## 2014-08-01 NOTE — Telephone Encounter (Signed)
Called and spoke with patient and advised her that Dr. Corliss Skainseveshwar declined the referral as well. Pt stated that she didn't understand why no one will take her. I explained that I didn't know either, however, we can't make another physician accept. Advised her that Dr. Shelle Ironlance stated to let her know that we tried [facility] and that she may try to see if her primary care physician would be able to get her an appointment. Pt stated that Dr. Shelle Ironlance was her primary care physician. Explained to patient that Dr. Shelle Ironlance is not primary care, however, she insisted that he was. Explained to her that we don't know why either of the physicians would not accept either.  She thanked me for my time and advised me to thank Dr. Shelle Ironlance as well for his time and effort. Will forward message back to Dr. Shelle Ironlance. Rhonda J Cobb

## 2014-08-01 NOTE — Telephone Encounter (Signed)
I called made pt aware. Order placed to get her new PCP

## 2014-08-07 ENCOUNTER — Ambulatory Visit (INDEPENDENT_AMBULATORY_CARE_PROVIDER_SITE_OTHER): Payer: Commercial Managed Care - PPO | Admitting: Family Medicine

## 2014-08-07 ENCOUNTER — Encounter: Payer: Self-pay | Admitting: Family Medicine

## 2014-08-07 DIAGNOSIS — S0101XA Laceration without foreign body of scalp, initial encounter: Secondary | ICD-10-CM

## 2014-08-07 NOTE — Progress Notes (Signed)
Procedure Consent obtained. 2 1/2 cc 1% lido local anesthesia. Cleaned with soap and water. Wound explored and small hanging flap removed. #7 5-0 prolene simple interrupted sutures placed. Excess blood removed from scalp and hair. Care instructions given.

## 2014-08-07 NOTE — Progress Notes (Signed)
° °  Subjective:    Patient ID: Miranda [name], female    DOB: 26-Sep-1960, 54 y.o.   MRN: [id number]  HPI No chief complaint on file.  This chart was scribed for Elvina SidleKurt Lauenstein, MD by Andrew Auaven Small, ED Scribe. This patient was seen in room 6 and the patient's care was started at 7:54 PM.  HPI Comments: Miranda [name] is a 54 y.o. female who presents to the [facility] complaining of right head laceration that occurred about 20 mins ago. Pt states she was in the attic when she hit her head on a light bulb.  Pt reports some numbness to area at this time, Pt denies LOC and  nausea.   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 Authorizing Provider  estradiol (ESTRACE) 0.5 MG tablet Take 1 tablet (0.5 mg total) by mouth daily. 09/20/13   Verner Choleborah S Leonard, CNM  Ibuprofen (ADVIL PO) Take by mouth as needed.    Historical Provider, MD  lisdexamfetamine (VYVANSE) 30 MG capsule Take 1 capsule (30 mg total) by mouth daily. 01/26/14   Barbaraann ShareKeith M Clance, MD  Melatonin 3 MG TABS Take by mouth. Take 3mg  4 hours before bedtime as needed    Historical Provider, MD  modafinil (PROVIGIL) 200 MG tablet TAKE 1 TABLET BY MOUTH TWICE DAILY AS NEEDED 06/17/13   Barbaraann ShareKeith M Clance, MD  Multiple Vitamins-Minerals (MULTIVITAMIN PO) Take by mouth. occ    Historical Provider, MD  Naproxen Sodium (ALEVE PO) Take by mouth as needed.    Historical Provider, MD  NUVIGIL 250 MG tablet take 1 tablet by mouth once daily 05/25/14   Barbaraann ShareKeith M Clance, MD  progesterone (PROMETRIUM) 100 MG capsule Take 1  capsule (100 mg total) by mouth daily. 09/20/13   Verner Choleborah S Leonard, CNM   Review of Systems  Gastrointestinal: Negative for nausea.  Skin: Positive for wound.  Neurological: Negative for syncope.   Objective:   Physical Exam  Constitutional: She is oriented to person, place, and time. She appears well-developed and well-nourished. No distress.  HENT:  Head: Normocephalic and atraumatic.  Eyes: Conjunctivae and EOM are normal.  Neck: Neck supple.  Cardiovascular: Normal rate.   Pulmonary/Chest: Effort normal.  Musculoskeletal: Normal range of motion.  Neurological: She is alert and oriented to person, place, and time.  Skin: Skin is warm and dry.  2 cm laceration to superior parietal scalp.  Psychiatric: She has a normal mood and affect. Her behavior is normal.  Nursing note and vitals reviewed.  Assessment & Plan:   This chart was scribed in my presence and reviewed by me personally.  This chart was scribed in my presence and reviewed by me personally.    ICD-9-CM ICD-10-CM   1. Scalp laceration, initial encounter 873.0 S01.01XA    Follow up 5-7 days  Signed, Elvina SidleKurt Lauenstein, MD   Signed, Elvina SidleKurt Lauenstein, MD

## 2014-08-15 ENCOUNTER — Ambulatory Visit (INDEPENDENT_AMBULATORY_CARE_PROVIDER_SITE_OTHER): Payer: Commercial Managed Care - PPO | Admitting: Physician Assistant

## 2014-08-15 VITALS — BP 110/72 | HR 89 | Temp 98.0°F | Resp 18

## 2014-08-15 DIAGNOSIS — Z4802 Encounter for removal of sutures: Secondary | ICD-10-CM

## 2014-08-15 DIAGNOSIS — S0101XS Laceration without foreign body of scalp, sequela: Secondary | ICD-10-CM

## 2014-08-15 NOTE — Progress Notes (Signed)
   Subjective:    Patient ID: Miranda [name], female    DOB: [birth year], 54 y.o.   MRN: [id number]  HPI Patient presents for suture removal following placement for scalp laceration. Has been well in the interim and denies fever, scalp pain, or headache. Has left area uncovered and has tried avoiding area as much as possible, but has washed hair.    Review of Systems  Constitutional: Negative.   Skin: Positive for wound.       Objective:   Physical Exam  Constitutional: She is oriented to person, place, and time. She appears well-developed and well-nourished. No distress.  Blood pressure 110/72, pulse 89, temperature 98 F (36.7 C), temperature source Oral, resp. rate 18, last menstrual period 09/06/2012, SpO2 98 %.  HENT:  Head: Normocephalic.  Right Ear: External ear normal.  Left Ear: External ear normal.  Eyes: Right eye exhibits no discharge. Left eye exhibits no discharge.  Pulmonary/Chest: Effort normal.  Neurological: She is alert and oriented to person, place, and time.  Skin: Skin is warm and dry. No rash noted. She is not diaphoretic. No erythema. No pallor.  #7 sutures on intact wound without erythema or purulence. Small amount of dried crust.       Assessment & Plan:  1. Visit for suture removal 2. Scalp laceration, sequela Wound healed well. No further f/u necessary.   Janan Ridgeishira Burlene Montecalvo PA-C  [facility] 08/15/2014 3:29 PM

## 2014-08-23 ENCOUNTER — Encounter: Payer: Self-pay | Admitting: Physician Assistant

## 2014-08-28 ENCOUNTER — Ambulatory Visit (INDEPENDENT_AMBULATORY_CARE_PROVIDER_SITE_OTHER): Payer: Commercial Managed Care - PPO | Admitting: Internal Medicine

## 2014-08-28 ENCOUNTER — Encounter: Payer: Self-pay | Admitting: Internal Medicine

## 2014-08-28 VITALS — BP 102/62 | HR 77 | Temp 98.2°F | Resp 14 | Ht 63.0 in | Wt 132.0 lb

## 2014-08-28 DIAGNOSIS — M1380 Other specified arthritis, unspecified site: Secondary | ICD-10-CM | POA: Diagnosis not present

## 2014-08-28 DIAGNOSIS — Z0000 Encounter for general adult medical examination without abnormal findings: Secondary | ICD-10-CM

## 2014-08-28 DIAGNOSIS — G4710 Hypersomnia, unspecified: Secondary | ICD-10-CM

## 2014-08-28 DIAGNOSIS — R002 Palpitations: Secondary | ICD-10-CM | POA: Diagnosis not present

## 2014-08-28 NOTE — Progress Notes (Signed)
Pre visit review using our clinic review tool, if applicable. No additional management support is needed unless otherwise documented below in the visit note. 

## 2014-08-28 NOTE — Patient Instructions (Signed)
We have cleaned out the right ear.   We have placed the referral for the rheumatologist and you will hear back about that.   We will have you go to the lab sometime this week or next when you are fasting so that we can check the cholesterol.   We will see you back in 6-12 months or call the office if you have any problems sooner.   Health Maintenance Adopting a healthy lifestyle and getting preventive care can go a long way to promote health and wellness. Talk with your health care provider about what schedule of regular examinations is right for you. This is a good chance for you to check in with your provider about disease prevention and staying healthy. In between checkups, there are plenty of things you can do on your own. Experts have done a lot of research about which lifestyle changes and preventive measures are most likely to keep you healthy. Ask your health care provider for more information. WEIGHT AND DIET  Eat a healthy diet  Be sure to include plenty of vegetables, fruits, low-fat dairy products, and lean protein.  Do not eat a lot of foods high in solid fats, added sugars, or salt.  Get regular exercise. This is one of the most important things you can do for your health.  Most adults should exercise for at least 150 minutes each week. The exercise should increase your heart rate and make you sweat (moderate-intensity exercise).  Most adults should also do strengthening exercises at least twice a week. This is in addition to the moderate-intensity exercise.  Maintain a healthy weight  Body mass index (BMI) is a measurement that can be used to identify possible weight problems. It estimates body fat based on height and weight. Your health care provider can help determine your BMI and help you achieve or maintain a healthy weight.  For females 89 years of age and older:   A BMI below 18.5 is considered underweight.  A BMI of 18.5 to 24.9 is normal.  A BMI of 25 to 29.9  is considered overweight.  A BMI of 30 and above is considered obese.  Watch levels of cholesterol and blood lipids  You should start having your blood tested for lipids and cholesterol at 54 years of age, then have this test every 5 years.  You may need to have your cholesterol levels checked more often if:  Your lipid or cholesterol levels are high.  You are older than 54 years of age.  You are at high risk for heart disease.  CANCER SCREENING   Lung Cancer  Lung cancer screening is recommended for adults 65-54 years old who are at high risk for lung cancer because of a history of smoking.  A yearly low-dose CT scan of the lungs is recommended for people who:  Currently smoke.  Have quit within the past 15 years.  Have at least a 30-pack-year history of smoking. A pack year is smoking an average of one pack of cigarettes a day for 1 year.  Yearly screening should continue until it has been 15 years since you quit.  Yearly screening should stop if you develop a health problem that would prevent you from having lung cancer treatment.  Breast Cancer  Practice breast self-awareness. This means understanding how your breasts normally appear and feel.  It also means doing regular breast self-exams. Let your health care provider know about any changes, no matter how small.  If you are  in your 60s or 30s, you should have a clinical breast exam (CBE) by a health care provider every 1-3 years as part of a regular health exam.  If you are 59 or older, have a CBE every year. Also consider having a breast X-ray (mammogram) every year.  If you have a family history of breast cancer, talk to your health care provider about genetic screening.  If you are at high risk for breast cancer, talk to your health care provider about having an MRI and a mammogram every year.  Breast cancer gene (BRCA) assessment is recommended for women who have family members with BRCA-related cancers.  BRCA-related cancers include:  Breast.  Ovarian.  Tubal.  Peritoneal cancers.  Results of the assessment will determine the need for genetic counseling and BRCA1 and BRCA2 testing. Cervical Cancer Routine pelvic examinations to screen for cervical cancer are no longer recommended for nonpregnant women who are considered low risk for cancer of the pelvic organs (ovaries, uterus, and vagina) and who do not have symptoms. A pelvic examination may be necessary if you have symptoms including those associated with pelvic infections. Ask your health care provider if a screening pelvic exam is right for you.   The Pap test is the screening test for cervical cancer for women who are considered at risk.  If you had a hysterectomy for a problem that was not cancer or a condition that could lead to cancer, then you no longer need Pap tests.  If you are older than 65 years, and you have had normal Pap tests for the past 10 years, you no longer need to have Pap tests.  If you have had past treatment for cervical cancer or a condition that could lead to cancer, you need Pap tests and screening for cancer for at least 20 years after your treatment.  If you no longer get a Pap test, assess your risk factors if they change (such as having a new sexual partner). This can affect whether you should start being screened again.  Some women have medical problems that increase their chance of getting cervical cancer. If this is the case for you, your health care provider may recommend more frequent screening and Pap tests.  The human papillomavirus (HPV) test is another test that may be used for cervical cancer screening. The HPV test looks for the virus that can cause cell changes in the cervix. The cells collected during the Pap test can be tested for HPV.  The HPV test can be used to screen women 77 years of age and older. Getting tested for HPV can extend the interval between normal Pap tests from three to  five years.  An HPV test also should be used to screen women of any age who have unclear Pap test results.  After 54 years of age, women should have HPV testing as often as Pap tests.  Colorectal Cancer  This type of cancer can be detected and often prevented.  Routine colorectal cancer screening usually begins at 54 years of age and continues through 54 years of age.  Your health care provider may recommend screening at an earlier age if you have risk factors for colon cancer.  Your health care provider may also recommend using home test kits to check for hidden blood in the stool.  A small camera at the end of a tube can be used to examine your colon directly (sigmoidoscopy or colonoscopy). This is done to check for the earliest forms  of colorectal cancer.  Routine screening usually begins at age 60.  Direct examination of the colon should be repeated every 5-10 years through 54 years of age. However, you may need to be screened more often if early forms of precancerous polyps or small growths are found. Skin Cancer  Check your skin from head to toe regularly.  Tell your health care provider about any new moles or changes in moles, especially if there is a change in a mole's shape or color.  Also tell your health care provider if you have a mole that is larger than the size of a pencil eraser.  Always use sunscreen. Apply sunscreen liberally and repeatedly throughout the day.  Protect yourself by wearing long sleeves, pants, a wide-brimmed hat, and sunglasses whenever you are outside. HEART DISEASE, DIABETES, AND HIGH BLOOD PRESSURE   Have your blood pressure checked at least every 1-2 years. High blood pressure causes heart disease and increases the risk of stroke.  If you are between 46 years and 12 years old, ask your health care provider if you should take aspirin to prevent strokes.  Have regular diabetes screenings. This involves taking a blood sample to check your  fasting blood sugar level.  If you are at a normal weight and have a low risk for diabetes, have this test once every three years after 54 years of age.  If you are overweight and have a high risk for diabetes, consider being tested at a younger age or more often. PREVENTING INFECTION  Hepatitis B  If you have a higher risk for hepatitis B, you should be screened for this virus. You are considered at high risk for hepatitis B if:  You were born in a country where hepatitis B is common. Ask your health care provider which countries are considered high risk.  Your parents were born in a high-risk country, and you have not been immunized against hepatitis B (hepatitis B vaccine).  You have HIV or AIDS.  You use needles to inject street drugs.  You live with someone who has hepatitis B.  You have had sex with someone who has hepatitis B.  You get hemodialysis treatment.  You take certain medicines for conditions, including cancer, organ transplantation, and autoimmune conditions. Hepatitis C  Blood testing is recommended for:  Everyone born from [birth year] through 1965.  Anyone with known risk factors for hepatitis C. Sexually transmitted infections (STIs)  You should be screened for sexually transmitted infections (STIs) including gonorrhea and chlamydia if:  You are sexually active and are younger than 54 years of age.  You are older than 54 years of age and your health care provider tells you that you are at risk for this type of infection.  Your sexual activity has changed since you were last screened and you are at an increased risk for chlamydia or gonorrhea. Ask your health care provider if you are at risk.  If you do not have HIV, but are at risk, it may be recommended that you take a prescription medicine daily to prevent HIV infection. This is called pre-exposure prophylaxis (PrEP). You are considered at risk if:  You are sexually active and do not regularly use condoms or  know the HIV status of your partner(s).  You take drugs by injection.  You are sexually active with a partner who has HIV. Talk with your health care provider about whether you are at high risk of being infected with HIV. If you choose to begin  PrEP, you should first be tested for HIV. You should then be tested every 3 months for as long as you are taking PrEP.  PREGNANCY   If you are premenopausal and you may become pregnant, ask your health care provider about preconception counseling.  If you may become pregnant, take 400 to 800 micrograms (mcg) of folic acid every day.  If you want to prevent pregnancy, talk to your health care provider about birth control (contraception). OSTEOPOROSIS AND MENOPAUSE   Osteoporosis is a disease in which the bones lose minerals and strength with aging. This can result in serious bone fractures. Your risk for osteoporosis can be identified using a bone density scan.  If you are 29 years of age or older, or if you are at risk for osteoporosis and fractures, ask your health care provider if you should be screened.  Ask your health care provider whether you should take a calcium or vitamin D supplement to lower your risk for osteoporosis.  Menopause may have certain physical symptoms and risks.  Hormone replacement therapy may reduce some of these symptoms and risks. Talk to your health care provider about whether hormone replacement therapy is right for you.  HOME CARE INSTRUCTIONS   Schedule regular health, dental, and eye exams.  Stay current with your immunizations.   Do not use any tobacco products including cigarettes, chewing tobacco, or electronic cigarettes.  If you are pregnant, do not drink alcohol.  If you are breastfeeding, limit how much and how often you drink alcohol.  Limit alcohol intake to no more than 1 drink per day for nonpregnant women. One drink equals 12 ounces of beer, 5 ounces of wine, or 1 ounces of hard liquor.  Do  not use street drugs.  Do not share needles.  Ask your health care provider for help if you need support or information about quitting drugs.  Tell your health care provider if you often feel depressed.  Tell your health care provider if you have ever been abused or do not feel safe at home. Document Released: 11/25/2010 Document Revised: 09/26/2013 Document Reviewed: 04/13/2013 [facility] Patient Information 2015 [place], [place]. This information is not intended to replace advice given to you by your health care provider. Make sure you discuss any questions you have with your health care provider.

## 2014-08-29 ENCOUNTER — Encounter: Payer: Self-pay | Admitting: Internal Medicine

## 2014-08-29 DIAGNOSIS — M1380 Other specified arthritis, unspecified site: Secondary | ICD-10-CM | POA: Insufficient documentation

## 2014-08-29 NOTE — Assessment & Plan Note (Signed)
Denies recent recurrence, previous workup unremarkable.

## 2014-08-29 NOTE — Assessment & Plan Note (Signed)
Would like to see local rheumatologist, previous work up unable to find cause. Sounds like she trialed some agents without much success and she was not happy with the communication from them to her. She can continue taking naproxen for now. No signs of inflammation of the joints on exam today.

## 2014-08-29 NOTE — Assessment & Plan Note (Signed)
Taking nuvigil and this helps some. She does follow with sleep medicine.

## 2014-08-29 NOTE — Progress Notes (Signed)
   Subjective:    Patient ID: Miranda [name], female    DOB: 1961-01-24, 54 y.o.   MRN: [id number]  HPI The patient is a 54 YO female who is coming in today to talk about her seronegative arthritis. [facility] the [facility] but does not wish to drive that far and does not feel like they were very helpful. She does have joint pains and stiffness for a long time everyday (1+hours). She denies any joint swelling right now but has had some in the past. She is taking naproxen for the aches and pains right now which is working okay. It does take the edge off the pain (from 6/10 to 4/10 with it).   [facility], social history reviewed and updated today.   Review of Systems  Constitutional: Negative for fever, chills, activity change, appetite change and unexpected weight change.  HENT: Negative.   Eyes: Negative.   Respiratory: Negative for cough, chest tightness, shortness of breath and wheezing.   Cardiovascular: Negative for chest pain, palpitations and leg swelling.  Gastrointestinal: Negative for nausea, abdominal pain, diarrhea, constipation and abdominal distention.  Musculoskeletal: Positive for arthralgias. Negative for myalgias, back pain and gait problem.  Skin: Negative.   Neurological: Negative.   Psychiatric/Behavioral: Negative.       Objective:   Physical Exam  Constitutional: She is oriented to person, place, and time. She appears well-developed and well-nourished.  HENT:  Head: Normocephalic and atraumatic.  Right ear obstructed with wax, cleaned and TM visualized afterwards  Eyes: EOM are normal.  Neck: Normal range of motion.  Cardiovascular: Normal rate and regular rhythm.   Pulmonary/Chest: Effort normal and breath sounds normal. No respiratory distress. She has no wheezes. She has no rales.  Abdominal: Soft. Bowel sounds are normal. She exhibits no distension. There is no tenderness.  Musculoskeletal:  No swollen joints or red joints..   Neurological:  She is alert and oriented to person, place, and time. Coordination normal.  Skin: Skin is warm and dry.  Small area of eczematous skin on the right second MCP joint   Filed Vitals:   08/28/14 1042  BP: 102/62  Pulse: 77  Temp: 98.2 F (36.8 C)  TempSrc: Oral  Resp: 14  Height: 5\' 3"  (1.6 m)  Weight: 132 lb (59.875 kg)  SpO2: 97%      Assessment & Plan:

## 2014-09-06 ENCOUNTER — Encounter: Payer: Self-pay | Admitting: Pulmonary Disease

## 2014-09-06 NOTE — Telephone Encounter (Signed)
Pt requesting nuvigil refill.  Last refill #30 on 05/25/14 with 1 refill.  Last ov 01/11/14, next ov scheduled for 01/12/15. KC are you ok with this refill?  Thanks!

## 2014-09-06 NOTE — Telephone Encounter (Signed)
Ok to fill [employer]

## 2014-09-07 MED ORDER — ARMODAFINIL 250 MG PO TABS: 250.0000 mg | ORAL_TABLET | Freq: Every day | ORAL | Status: DC

## 2014-09-14 ENCOUNTER — Other Ambulatory Visit: Payer: Self-pay | Admitting: Pulmonary Disease

## 2014-09-19 ENCOUNTER — Encounter: Payer: Self-pay | Admitting: Certified Nurse Midwife

## 2014-09-19 ENCOUNTER — Ambulatory Visit (INDEPENDENT_AMBULATORY_CARE_PROVIDER_SITE_OTHER): Payer: Commercial Managed Care - PPO | Admitting: Certified Nurse Midwife

## 2014-09-19 VITALS — BP 94/62 | HR 76 | Resp 16 | Ht 63.25 in | Wt 122.0 lb

## 2014-09-19 DIAGNOSIS — Z1211 Encounter for screening for malignant neoplasm of colon: Secondary | ICD-10-CM

## 2014-09-19 DIAGNOSIS — N951 Menopausal and female climacteric states: Secondary | ICD-10-CM

## 2014-09-19 DIAGNOSIS — Z01419 Encounter for gynecological examination (general) (routine) without abnormal findings: Secondary | ICD-10-CM | POA: Diagnosis not present

## 2014-09-19 DIAGNOSIS — Z0000 Encounter for general adult medical examination without abnormal findings: Secondary | ICD-10-CM | POA: Diagnosis not present

## 2014-09-19 LAB — POCT URINALYSIS DIPSTICK
BILIRUBIN UA: NEGATIVE
Blood, UA: NEGATIVE
Glucose, UA: NEGATIVE
Ketones, UA: NEGATIVE
Leukocytes, UA: NEGATIVE
NITRITE UA: NEGATIVE
Protein, UA: NEGATIVE
UROBILINOGEN UA: NEGATIVE
pH, UA: 5

## 2014-09-19 MED ORDER — PROGESTERONE MICRONIZED 100 MG PO CAPS: 100.0000 mg | ORAL_CAPSULE | Freq: Every day | ORAL | Status: DC

## 2014-09-19 MED ORDER — ESTRADIOL 0.5 MG PO TABS: 0.5000 mg | ORAL_TABLET | Freq: Every day | ORAL | Status: DC

## 2014-09-19 NOTE — Progress Notes (Signed)
54 y.o. G0P0000 Single  Caucasian Fe here for annual exam.  Menopausal on HRT, denies vaginal bleeding or dryness. Has had first meeting visit with PCP, no exam yet. Ready to schedule colonoscopy now. Would like referral to Dr. Juanda ChanceBrodie. No health issues today.   Patient's last menstrual period was 09/06/2012.          Sexually active: Yes.    The current method of family planning is none.    Exercising: Yes.    Strength training, light cardio Smoker:  no  Health Maintenance: Pap: 09/13/13 Neg. HR HPV:Neg MMG: 09/19/13 BIRADS1:Neg Self Breast Exam: yes, every few months. Colonoscopy:  Never BMD:   Never TDaP: 2010 Labs: PCP UA: Negative   reports that she has never smoked. She has never used smokeless tobacco. She reports that she does not drink alcohol or use illicit drugs.  Past Medical History  Diagnosis Date  . Idiopathic hypersomnia   . Allergy, unspecified not elsewhere classified   . IBS (irritable bowel syndrome)   . 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 Allergy   . Abnormal Pap smear of cervix 1996    Past Surgical History  Procedure Laterality Date  . Shoulder surgery      right  . Tonsillectomy    . Cryotherapy      Current Outpatient Prescriptions  Medication Sig Dispense Refill  . Armodafinil (NUVIGIL) 250 MG tablet Take 1 tablet (250 mg total) by mouth daily. 30 tablet 5  . estradiol (ESTRACE) 0.5 MG tablet Take 1 tablet (0.5 mg total) by mouth daily. 30 tablet 11  . Ibuprofen (ADVIL PO) Take by mouth as needed.    . Melatonin 3 MG TABS Take by mouth. Take 3mg  4 hours before bedtime as needed    . Multiple Vitamins-Minerals (MULTIVITAMIN PO) Take by mouth. occ    . progesterone (PROMETRIUM) 100 MG capsule Take 1 capsule (100 mg total) by mouth daily. 30 capsule 11   No current facility-administered medications for this visit.    Family History  Problem Relation Age of Onset  . Adopted: Yes  . Cancer Mother     stomach  . Heart disease  Mother   . Heart attack Mother   . Heart disease Father     ROS:  Pertinent items are noted in HPI.  Otherwise, a comprehensive ROS was negative.  Exam:   BP 94/62 mmHg  Pulse 76  Resp 16  Ht 5' 3.25" (1.607 m)  Wt 122 lb (55.339 kg)  BMI 21.43 kg/m2  LMP 09/06/2012 Height: 5' 3.25" (160.7 cm) Ht Readings from Last 3 Encounters:  09/19/14 5' 3.25" (1.607 m)  08/28/14 5\' 3"  (1.6 m)  01/11/14 5\' 4"  (1.626 m)    General appearance: alert, cooperative and appears stated age Head: Normocephalic, without obvious abnormality, atraumatic Neck: no adenopathy, supple, symmetrical, trachea midline and thyroid normal to inspection and palpation Lungs: clear to auscultation bilaterally Breasts: normal appearance, no masses or tenderness, No nipple retraction or dimpling, No nipple discharge or bleeding, No axillary or supraclavicular adenopathy Heart: regular rate and rhythm Abdomen: soft, non-tender; no masses,  no organomegaly Extremities: extremities normal, atraumatic, no cyanosis or edema Skin: Skin color, texture, turgor normal. No rashes or lesions Lymph nodes: Cervical, supraclavicular, and axillary nodes normal. No abnormal inguinal nodes palpated Neurologic: Grossly normal   Pelvic: External genitalia:  no lesions              Urethra:  normal appearing  urethra with no masses, tenderness or lesions              Bartholin's and Skene's: normal                 Vagina: normal appearing vagina with normal color and discharge, no lesions              Cervix: normal, non tender, no lesions              Pap taken: No. Bimanual Exam:  Uterus:  normal size, contour, position, consistency, mobility, non-tender              Adnexa: normal adnexa and no mass, fullness, tenderness               Rectovaginal: Confirms               Anus:  normal sphincter tone, no lesions   A:  Well Woman with normal exam  Menopausal on HRT desires continuance  Colonoscopy due  History of RA no  medication yet  P:   Reviewed health and wellness pertinent to exam  Discussed risks and benefits and expectations, patient would like to continue, provider agreeable. Patient will advise if any health changes  Rx Estrace see order  Rx Prometrium see order  Referral will be made to Dr Juanda Chance and patient will be contacted with information.  Continue follow up as indicated  Pap smear not taken today   counseled on breast self exam, mammography screening, adequate intake of calcium and vitamin D, diet and exercise  return annually or prn  An After Visit Summary was printed and given to the patient.

## 2014-09-19 NOTE — Patient Instructions (Signed)

## 2014-09-19 NOTE — Progress Notes (Signed)
Reviewed personally.  M. Suzanne Mercadez Heitman, MD.  

## 2014-09-20 ENCOUNTER — Encounter: Payer: Self-pay | Admitting: Internal Medicine

## 2014-09-27 ENCOUNTER — Telehealth: Payer: Self-pay | Admitting: Certified Nurse Midwife

## 2014-09-28 ENCOUNTER — Other Ambulatory Visit (INDEPENDENT_AMBULATORY_CARE_PROVIDER_SITE_OTHER): Payer: Commercial Managed Care - PPO

## 2014-09-28 DIAGNOSIS — Z0000 Encounter for general adult medical examination without abnormal findings: Secondary | ICD-10-CM | POA: Diagnosis not present

## 2014-09-28 LAB — LIPID PANEL
CHOL/HDL RATIO: 3
CHOLESTEROL: 188 mg/dL (ref 0–200)
HDL: 72.3 mg/dL (ref >39.00)
LDL CALC: 104 mg/dL — AB (ref 0–99)
NonHDL: 115.7
Triglycerides: 58 mg/dL (ref 0.0–149.0)
VLDL: 11.6 mg/dL (ref 0.0–40.0)

## 2014-09-28 LAB — CBC
HCT: 43.2 % (ref 36.0–46.0)
HEMOGLOBIN: 15 g/dL (ref 12.0–15.0)
MCHC: 34.6 g/dL (ref 30.0–36.0)
MCV: 91.9 fl (ref 78.0–100.0)
PLATELETS: 168 10*3/uL (ref 150.0–400.0)
RBC: 4.71 Mil/uL (ref 3.87–5.11)
RDW: 12.8 % (ref 11.5–15.5)
WBC: 6.9 10*3/uL (ref 4.0–10.5)

## 2014-09-28 LAB — BASIC METABOLIC PANEL
BUN: 16 mg/dL (ref 6–23)
CO2: 30 mEq/L (ref 19–32)
Calcium: 9.3 mg/dL (ref 8.4–10.5)
Chloride: 103 mEq/L (ref 96–112)
Creatinine, Ser: 1.05 mg/dL (ref 0.40–1.20)
GFR: 57.99 mL/min — AB (ref >60.00)
Glucose, Bld: 88 mg/dL (ref 70–99)
POTASSIUM: 4.6 meq/L (ref 3.5–5.1)
SODIUM: 138 meq/L (ref 135–145)

## 2014-09-29 ENCOUNTER — Encounter: Payer: Self-pay | Admitting: Pulmonary Disease

## 2014-10-19 ENCOUNTER — Encounter: Payer: Self-pay | Admitting: Certified Nurse Midwife

## 2014-10-20 ENCOUNTER — Telehealth: Payer: Self-pay | Admitting: Emergency Medicine

## 2014-10-20 NOTE — Telephone Encounter (Signed)
Telephone call for triage created to discuss message with patient and disposition as appropriate.   

## 2014-10-20 NOTE — Telephone Encounter (Signed)
Chief Complaint  Patient presents with  . Advice Only    Patient sent mychart message    [name], can you please review. Patient requesting pap results, however, pap not taken.

## 2014-10-20 NOTE — Telephone Encounter (Signed)
 -----   Message -----    From: Pete GlatterBONNSTETTER,Maisyn L    Sent: 10/19/2014  9:17 PM EDT      To: Leota SauersLEONARD,DEBORAH, CNM Subject: Non-Urgent Medical Question  I had my annual exam on the 26th and have not received my pap results in the mail and do not see them on the website. I think typically I would have received them by now.

## 2014-10-23 NOTE — Telephone Encounter (Signed)
Reviewed and did not take pap as she had HPVHR the year before and no indication per history to repeat this year. We discussed this the visit, and she would have a pap in 2017.

## 2014-10-24 NOTE — Telephone Encounter (Signed)
Patient returned call and message from Verner Choleborah S. Leonard CNM given. Patient states she remembers now. She is thankful for update.  Routing to provider for final review. Patient agreeable to disposition. Will close encounter.

## 2014-10-24 NOTE — Telephone Encounter (Signed)
Message left to return call to Mackinley Cassaday at [phone].    

## 2014-11-17 ENCOUNTER — Ambulatory Visit (AMBULATORY_SURGERY_CENTER): Payer: Self-pay | Admitting: *Deleted

## 2014-11-17 VITALS — Ht 64.0 in | Wt 123.0 lb

## 2014-11-17 DIAGNOSIS — Z1211 Encounter for screening for malignant neoplasm of colon: Secondary | ICD-10-CM

## 2014-11-17 MED ORDER — NA SULFATE-K SULFATE-MG SULF 17.5-3.13-1.6 GM/177ML PO SOLN: 1.0000 | Freq: Once | ORAL | Status: DC

## 2014-11-17 NOTE — Progress Notes (Signed)
No egg or soy allergy. No anesthesia problems.  No home O2.  No diet meds.  

## 2014-12-01 ENCOUNTER — Ambulatory Visit (AMBULATORY_SURGERY_CENTER): Payer: Commercial Managed Care - PPO | Admitting: Internal Medicine

## 2014-12-01 ENCOUNTER — Encounter: Payer: Self-pay | Admitting: Internal Medicine

## 2014-12-01 VITALS — BP 97/56 | HR 86 | Temp 97.2°F | Resp 10 | Ht 64.0 in | Wt 123.0 lb

## 2014-12-01 DIAGNOSIS — Z1211 Encounter for screening for malignant neoplasm of colon: Secondary | ICD-10-CM

## 2014-12-01 MED ORDER — SODIUM CHLORIDE 0.9 % IV SOLN: 500.0000 mL | INTRAVENOUS | Status: DC

## 2014-12-01 NOTE — Op Note (Signed)
[facility] [address] [place], [id number]   COLONOSCOPY PROCEDURE REPORT  PATIENT: Miranda GlatterBonnstetter, Daneille L  MR#: [id number] BIRTHDATE: Jun 12, 1960 , 54  yrs. old GENDER: female ENDOSCOPIST: Hart Carwinora M Klaira Pesci, MD REFERRED XB:JYNWGNFAOBY:Elizabeth Dorise HissKollar, M.D. PROCEDURE DATE:  12/01/2014 PROCEDURE:   Colonoscopy, screening First Screening Colonoscopy - Avg.  risk and is 50 yrs.  old or older Yes.  Prior Negative Screening - Now for repeat screening. N/A  History of Adenoma - Now for follow-up colonoscopy & has been > or = to 3 yrs.  N/A  Polyps removed today? No Recommend repeat exam, <10 yrs? No ASA CLASS:   Class II INDICATIONS:Screening for colonic neoplasia and Colorectal Neoplasm Risk Assessment for this procedure is average risk. MEDICATIONS: Monitored anesthesia care and Propofol 200 mg IV  DESCRIPTION OF PROCEDURE:   After the risks benefits and alternatives of the procedure were thoroughly explained, informed consent was obtained.  The digital rectal exam revealed no abnormalities of the rectum.   The LB [id number]-[id number]  endoscope was introduced through the anus and advanced to the cecum, which was identified by both the appendix and ileocecal valve. No adverse events experienced.   The quality of the prep was good.  (MoviPrep was used)  The instrument was then slowly withdrawn as the colon was fully examined. Estimated blood loss is zero unless otherwise noted in this procedure report.      COLON FINDINGS: A normal appearing cecum, ileocecal valve, and appendiceal orifice were identified.  The ascending, transverse, descending, sigmoid colon, and rectum appeared unremarkable. Retroflexed views revealed no abnormalities. The time to cecum = 4.56.Withdrawal time = 6.42   The scope was withdrawn and the procedure completed. COMPLICATIONS: There were no immediate complications.  ENDOSCOPIC IMPRESSION: Normal colonoscopy  RECOMMENDATIONS: High fiber  diet  Recall colonoscopy in 10 years  eSigned:  Hart Carwinora M Donasia Wimes, MD 12/01/2014 11:25 AM   cc:

## 2014-12-01 NOTE — Progress Notes (Signed)
To recovery, report to Scott, RN, VSS 

## 2014-12-01 NOTE — Patient Instructions (Signed)
YOU HAD AN ENDOSCOPIC PROCEDURE TODAY AT THE [facility]:   Refer to the procedure report that was given to you for any specific questions about what was found during the examination.  If the procedure report does not answer your questions, please call your gastroenterologist to clarify.  If you requested that your care partner not be given the details of your procedure findings, then the procedure report has been included in a sealed envelope for you to review at your convenience later.  YOU SHOULD EXPECT: Some feelings of bloating in the abdomen. Passage of more gas than usual.  Walking can help get rid of the air that was put into your GI tract during the procedure and reduce the bloating. If you had a lower endoscopy (such as a colonoscopy or flexible sigmoidoscopy) you may notice spotting of blood in your stool or on the toilet paper. If you underwent a bowel prep for your procedure, you may not have a normal bowel movement for a few days.  Please Note:  You might notice some irritation and congestion in your nose or some drainage.  This is from the oxygen used during your procedure.  There is no need for concern and it should clear up in a day or so.  SYMPTOMS TO REPORT IMMEDIATELY:   Following lower endoscopy (colonoscopy or flexible sigmoidoscopy):  Excessive amounts of blood in the stool  Significant tenderness or worsening of abdominal pains  Swelling of the abdomen that is new, acute  Fever of 100F or higher    For urgent or emergent issues, a gastroenterologist can be reached at any hour by calling [phone].   DIET: Your first meal following the procedure should be a small meal and then it is ok to progress to your normal diet. Heavy or fried foods are harder to digest and may make you feel nauseous or bloated.  Likewise, meals heavy in dairy and vegetables can increase bloating.  Drink plenty of fluids but you should avoid alcoholic beverages for 24  hours.  ACTIVITY:  You should plan to take it easy for the rest of today and you should NOT DRIVE or use heavy machinery until tomorrow (because of the sedation medicines used during the test).    FOLLOW UP: Our staff will call the number listed on your records the next business day following your procedure to check on you and address any questions or concerns that you may have regarding the information given to you following your procedure. If we do not reach you, we will leave a message.  However, if you are feeling well and you are not experiencing any problems, there is no need to return our call.  We will assume that you have returned to your regular daily activities without incident.  If any biopsies were taken you will be contacted by phone or by letter within the next 1-3 weeks.  Please call us at [phone] if you have not heard about the biopsies in 3 weeks.    SIGNATURES/CONFIDENTIALITY: You and/or your care partner have signed paperwork which will be entered into your electronic medical record.  These signatures attest to the fact that that the information above on your After Visit Summary has been reviewed and is understood.  Full responsibility of the confidentiality of this discharge information lies with you and/or your care-partner.  High fiber diet  Follow-up 10 years-2026

## 2014-12-04 ENCOUNTER — Telehealth: Payer: Self-pay | Admitting: *Deleted

## 2014-12-04 NOTE — Telephone Encounter (Signed)
  Follow up Call-  Call back number 12/01/2014  Post procedure Call Back phone  # [phone]  Permission to leave phone message Yes     Patient questions:  Do you have a fever, pain , or abdominal swelling? No. Pain Score  0 *  Have you tolerated food without any problems? Yes.    Have you been able to return to your normal activities? Yes.    Do you have any questions about your discharge instructions: Diet   No. Medications  No. Follow up visit  No.  Do you have questions or concerns about your Care? No.  Actions: * If pain score is 4 or above: No action needed, pain <4.

## 2014-12-15 ENCOUNTER — Other Ambulatory Visit: Payer: Self-pay | Admitting: *Deleted

## 2014-12-15 DIAGNOSIS — N951 Menopausal and female climacteric states: Secondary | ICD-10-CM

## 2014-12-15 NOTE — Telephone Encounter (Signed)
Medication refill request: Progesterone 100 and Estradiol 0.5 Last AEX:  09/19/14 DL Next AEX: 5/40/98 DL Last MMG (if hormonal medication request): 09/19/13 Bi-rads C 1 neg. Refill authorized: Please advise.

## 2014-12-15 NOTE — Telephone Encounter (Signed)
Routed to [place], [place]

## 2014-12-15 NOTE — Telephone Encounter (Signed)
Check to see if she has had mammogram for refill

## 2014-12-15 NOTE — Telephone Encounter (Signed)
Patient was given a call regarding scheduling her a mammogram.  Per pt she is going on vacation and will be back next week. Pt stated that she will schedule her mammogram for next week.

## 2014-12-18 ENCOUNTER — Telehealth: Payer: Self-pay | Admitting: *Deleted

## 2014-12-18 DIAGNOSIS — N951 Menopausal and female climacteric states: Secondary | ICD-10-CM

## 2014-12-18 MED ORDER — ESTRADIOL 0.5 MG PO TABS: 0.5000 mg | ORAL_TABLET | Freq: Every day | ORAL | Status: DC

## 2014-12-18 MED ORDER — PROGESTERONE MICRONIZED 100 MG PO CAPS: 100.0000 mg | ORAL_CAPSULE | Freq: Every day | ORAL | Status: DC

## 2014-12-18 NOTE — Telephone Encounter (Signed)
Medication refill request from [facility] for : Estradiol 0.5  ; Progesterone 100 mg Last AEX: 09/19/14 with DL #16/10 rfs was sent for both rx's to [facility]. Next AEX: 09/20/15 with DL  Last MMG (if hormonal medication request): 09/19/13 bi-rads 1: negative  Refill authorized: #90/2 rfs   (routed to Ms. Newt Minion)

## 2014-12-19 NOTE — Telephone Encounter (Signed)
Please notify patient we can not refill her HRT if mammogram not current. We discussed the need for yearly exam at her aex.

## 2014-12-20 NOTE — Telephone Encounter (Signed)
Patient aware that she needs to schedule mammogram, she is out of town and will call and schedule mammogram when she is back Monday. She said she will call us once it's done so she can request refill then. I advised her that Ms. Eunice Blase will review it and if everything it's fine, we will send refill in.

## 2015-01-12 ENCOUNTER — Ambulatory Visit: Payer: Commercial Managed Care - PPO | Admitting: Pulmonary Disease

## 2015-03-23 ENCOUNTER — Encounter: Payer: Self-pay | Admitting: Internal Medicine

## 2015-03-23 ENCOUNTER — Ambulatory Visit (INDEPENDENT_AMBULATORY_CARE_PROVIDER_SITE_OTHER): Payer: Commercial Managed Care - PPO | Admitting: Internal Medicine

## 2015-03-23 VITALS — BP 98/64 | HR 85 | Ht 64.0 in | Wt 122.6 lb

## 2015-03-23 DIAGNOSIS — G4700 Insomnia, unspecified: Secondary | ICD-10-CM

## 2015-03-23 DIAGNOSIS — G4710 Hypersomnia, unspecified: Secondary | ICD-10-CM | POA: Diagnosis not present

## 2015-03-23 MED ORDER — ARMODAFINIL 250 MG PO TABS: 250.0000 mg | ORAL_TABLET | Freq: Every day | ORAL | Status: DC

## 2015-03-23 MED ORDER — ZALEPLON 5 MG PO CAPS: ORAL_CAPSULE | ORAL | Status: DC

## 2015-03-23 NOTE — Assessment & Plan Note (Signed)
She describes some daytime sleepiness and some sustained wakefulness at night, nonspecific pattern which does not exclude narcolepsy across a be explained by depression or by combined idiopathic hypersomnia and simple fatigue. Plan-okay to continue new visual which is being used appropriately.

## 2015-03-23 NOTE — Progress Notes (Signed)
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 She has been on Provigil twice a day, but feels that her cyst sleepiness has been getting a little worse. She gets to bed at a reasonable hour, and although she does have some awakenings at night, and overall she feels that she sleeps and adequate quantity. He feels that her sleepiness is starting to impact her quality of life a little more  03/23/15- 54 yoF never smoker followed for idiopathic hypersomnia Follow for: Still waking up several times.5-6 hours of sleep nightly. Still taking Nuvigil, tolerating well.  NPSG 12/19/01- AHI 0, no snoring, 24% REM, 88.7% sleep efficiency MSLT- 07/05/03- nonspecific hypersomnia, mean latency 3.5 minutes, No SOREM She has been satisfied using Nuvigil 250 mg once daily but still fights daytime sleepiness sometimes at work if quiet. Bedtime around 8:30 PM, up around 4:30 AM to work out. Falls asleep easily with melatonin 3 mg but says that makes her drowsy or in the morning. Wakes many nights between 2 and 3 AM 8045 minutes or so to fall back to sleep. Rare sleep paralysis. Describes occasional episode where if startled, her heart will race and limbs will feel a little weak, questionable cataplexy. Denies thyroid problems.  ROS-see HPI   Negative unless "+" Constitutional:    weight loss, night sweats, fevers, chills, + fatigue, lassitude. HEENT:    headaches, difficulty swallowing, tooth/dental problems, sore throat,       sneezing, itching, ear ache, nasal congestion, post nasal drip, snoring CV:    chest pain, orthopnea, PND, swelling in lower extremities, anasarca,                                                    dizziness, palpitations Resp:   shortness of breath with exertion or at rest.                productive cough,   non-productive cough, coughing up of blood.              change in color of mucus.  wheezing.   Skin:    rash or lesions. GI:  No-   heartburn,  indigestion, abdominal pain, nausea, vomiting,e GU: . MS:   joint pain, stiffness, . Neuro-     nothing unusual Psych:  change in mood or affect.  depression or anxiety.   memory loss.  OBJ- Physical Exam General- Alert, Oriented, Affect-appropriate, Distress- none acute, + trim Skin- rash-none, lesions- none, excoriation- none Lymphadenopathy- none Head- atraumatic            Eyes- Gross vision intact, PERRLA, conjunctivae and secretions clear            Ears- Hearing, canals-normal            Nose- Clear, no-Septal dev, mucus, polyps, erosion, perforation             Throat- Mallampati II , mucosa clear , drainage- none, tonsils- atrophic Neck- flexible , trachea midline, no stridor , thyroid nl, carotid no bruit Chest - symmetrical excursion , unlabored           Heart/CV- RRR , no murmur , no gallop  , no rub, nl s1 s2                           -  JVD- none , edema- none, stasis changes- none, varices- none           Lung- clear to P&A, wheeze- none, cough- none , dullness-none, rub- none           Chest wall-  Abd-  Br/ Gen/ Rectal- Not done, not indicated Extrem- cyanosis- none, clubbing, none, atrophy- none, strength- nl Neuro- grossly intact to observation     

## 2015-03-23 NOTE — Patient Instructions (Signed)
Script printed to refill Nuvigil  Script to try Sonata/ zaleplon at bedtime to see if you sleep better

## 2015-03-23 NOTE — Assessment & Plan Note (Signed)
She did not like trazodone and says melatonin can leave her feeling drowsy in the daytime. It does not sound as if consolidating nighttime sleep would likely make much impact on her residual daytime drowsiness, but we are going to try a short acting medication to see what difference it makes, avoiding morning carryover. Plan-try Sonata 5 mg, one or 2 at bedtime as discussed

## 2015-05-31 ENCOUNTER — Telehealth: Payer: Self-pay | Admitting: *Deleted

## 2015-05-31 NOTE — Telephone Encounter (Signed)
Initiated PA for Nuvigil thru [facility]. Key: [id number]  Medication has been approved until 05/30/2016.  Pharmacy informed.

## 2015-06-08 ENCOUNTER — Other Ambulatory Visit: Payer: Self-pay

## 2015-06-08 MED ORDER — ARMODAFINIL 250 MG PO TABS: 250.0000 mg | ORAL_TABLET | Freq: Every day | ORAL | Status: DC

## 2015-06-08 NOTE — Telephone Encounter (Signed)
Faxed to pharmacy

## 2015-06-21 ENCOUNTER — Other Ambulatory Visit: Payer: Self-pay | Admitting: *Deleted

## 2015-06-21 MED ORDER — ARMODAFINIL 250 MG PO TABS: 250.0000 mg | ORAL_TABLET | Freq: Every day | ORAL | Status: DC

## 2015-06-27 ENCOUNTER — Encounter: Payer: Self-pay | Admitting: Family Medicine

## 2015-06-27 ENCOUNTER — Ambulatory Visit (INDEPENDENT_AMBULATORY_CARE_PROVIDER_SITE_OTHER): Payer: Commercial Managed Care - PPO | Admitting: Family Medicine

## 2015-06-27 VITALS — BP 101/65 | HR 83 | Temp 98.3°F | Resp 16 | Ht 63.5 in | Wt 121.6 lb

## 2015-06-27 DIAGNOSIS — G8929 Other chronic pain: Secondary | ICD-10-CM | POA: Diagnosis not present

## 2015-06-27 DIAGNOSIS — R1013 Epigastric pain: Secondary | ICD-10-CM

## 2015-06-27 LAB — COMPREHENSIVE METABOLIC PANEL
ALT: 23 U/L (ref 6–29)
AST: 35 U/L (ref 10–35)
Albumin: 4.1 g/dL (ref 3.6–5.1)
Alkaline Phosphatase: 76 U/L (ref 33–130)
BUN: 18 mg/dL (ref 7–25)
CHLORIDE: 105 mmol/L (ref 98–110)
CO2: 30 mmol/L (ref 20–31)
Calcium: 9 mg/dL (ref 8.6–10.4)
Creat: 0.89 mg/dL (ref 0.50–1.05)
GLUCOSE: 79 mg/dL (ref 65–99)
POTASSIUM: 3.6 mmol/L (ref 3.5–5.3)
SODIUM: 139 mmol/L (ref 135–146)
Total Bilirubin: 0.5 mg/dL (ref 0.2–1.2)
Total Protein: 6.2 g/dL (ref 6.1–8.1)

## 2015-06-27 LAB — LIPASE: LIPASE: 23 U/L (ref 7–60)

## 2015-06-27 LAB — CBC
HCT: 42.1 % (ref 36.0–46.0)
Hemoglobin: 14 g/dL (ref 12.0–15.0)
MCH: 31 pg (ref 26.0–34.0)
MCHC: 33.3 g/dL (ref 30.0–36.0)
MCV: 93.1 fL (ref 78.0–100.0)
MPV: 10.8 fL (ref 8.6–12.4)
PLATELETS: 157 10*3/uL (ref 150–400)
RBC: 4.52 MIL/uL (ref 3.87–5.11)
RDW: 13 % (ref 11.5–15.5)
WBC: 6.2 10*3/uL (ref 4.0–10.5)

## 2015-06-27 MED ORDER — SUCRALFATE 1 G PO TABS: 1.0000 g | ORAL_TABLET | Freq: Three times a day (TID) | ORAL | Status: DC

## 2015-06-27 MED ORDER — GI COCKTAIL ~~LOC~~
30.0000 mL | Freq: Once | ORAL | Status: AC
  Administered 2015-06-27: 30 mL via ORAL

## 2015-06-27 NOTE — Patient Instructions (Signed)
I will be in touch with your labs asap For the time being try the carafate before meals and before bed- this will hopefully ease your stomach pain Try to avoid any acidic or spicy foods- no tomatoes, limit coffee, no mints  Let me know if you are getting worse!

## 2015-06-27 NOTE — Progress Notes (Signed)
[facility] [address], [place] [address] [phone]  Date:  06/27/2015   Name:  Miranda [name]   DOB:  1961/03/08   MRN:  [id number]  PCP:  Myrlene Broker, MD    Chief Complaint: Abdominal Pain; Nausea; and Depression   History of Present Illness:  Miranda [name] is a 55 y.o. very pleasant female patient who presents with the following:  I saw this pt in 2014 with SOB,  Here today with concern of epigastric pain and nausea for 3 months or so. It will wax and wane. No vomiting She feels tired.  It does not seem to follow any pattern. It may feel better after eating temporarily but then worse again She is worried that she might have an ulcer She has used some aspirin/ ibuprofen for aches and pain- she was taking this more in the fall and thinks she may have overdone it Never had an ulcer before- her bio mom did have stomach cancer  She has lost a little weight- she is trying to eat to maintain her weight  She did have a colonoscopy last year   She did have a CBC recently per her rheum- no other labs recently  She has tried some medications such as prilosec but it did not seem to help   She did answer positively to the depression sx- however she thinks this is all relate to her stomach She is not on   Wt Readings from Last 3 Encounters:  06/27/15 121 lb 9.6 oz (55.157 kg)  03/23/15 122 lb 9.6 oz (55.611 kg)  12/01/14 123 lb (55.792 kg)    Patient Active Problem List   Diagnosis Date Noted  . Seronegative arthritis 08/29/2014  . Dyspnea 04/14/2013  . Palpitations 04/14/2013  . 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 Hypersomnia 07/02/2007  . ALLERGY 05/10/2007    Past Medical History  Diagnosis Date  . Idiopathic hypersomnia   . Allergy, unspecified not elsewhere classified   . IBS (irritable bowel syndrome)   . 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 Allergy   . Abnormal  Pap smear of cervix 1996    Past Surgical History  Procedure Laterality Date  . Shoulder surgery      right  . Tonsillectomy    . Cryotherapy      Social History  Substance Use Topics  . Smoking status: Never Smoker   . Smokeless tobacco: Never Used  . Alcohol Use: No    Family History  Problem Relation Age of Onset  . Adopted: Yes  . Cancer Mother     stomach  . Heart disease Mother   . Heart attack Mother   . Heart disease Father   . Colon cancer Neg Hx     Allergies  Allergen Reactions  . Keflex [Cephalexin] Itching    Medication list has been reviewed and updated.  Current Outpatient Prescriptions on File Prior to Visit  Medication Sig Dispense Refill  . Armodafinil (NUVIGIL) 250 MG tablet Take 1 tablet (250 mg total) by mouth daily. 90 tablet 1  . estradiol (ESTRACE) 0.5 MG tablet Take 1 tablet (0.5 mg total) by mouth daily. 90 tablet 2  . Melatonin 3 MG TABS Take by mouth. Reported on 06/27/2015    . progesterone (PROMETRIUM) 100 MG capsule Take 1 capsule (100 mg total) by mouth daily. 90 capsule 2  . etanercept (ENBREL) 50 MG/ML  injection Inject 50 mg into the skin once a week. Reported on 06/27/2015    . Ibuprofen (ADVIL PO) Take by mouth as needed. Reported on 06/27/2015    . zaleplon (SONATA) 5 MG capsule 1 or 2 at bedtime as needed for sleep (Patient not taking: Reported on 06/27/2015) 20 capsule 1   No current facility-administered medications on file prior to visit.    Review of Systems:  As per HPI- otherwise negative.   Physical Examination: Filed Vitals:   06/27/15 1627  BP: 101/65  Pulse: 83  Temp: 98.3 F (36.8 C)  Resp: 16   Filed Vitals:   06/27/15 1627  Height: 5' 3.5" (1.613 m)  Weight: 121 lb 9.6 oz (55.157 kg)   Body mass index is 21.2 kg/(m^2). Ideal Body Weight: Weight in (lb) to have BMI = 25: 143.1  GEN: WDWN, NAD, Non-toxic, A & O x 3, looks well, slim build HEENT: Atraumatic, Normocephalic. Neck supple. No masses, No  LAD. Ears and Nose: No external deformity. CV: RRR, No M/G/R. No JVD. No thrill. No extra heart sounds. PULM: CTA B, no wheezes, crackles, rhonchi. No retractions. No resp. distress. No accessory muscle use. ABD: S, ND, +BS. No rebound. No HSM. EXTR: No c/c/e NEURO Normal gait.  PSYCH: Normally interactive. Conversant. Not depressed or anxious appearing.  Calm demeanor.  Epigastric TTPv on exam- mild.  Minimal RUQ tenderness and negative murphy's  Given a GI cocktail- she is not really sure if this helped Assessment and Plan: Abdominal pain, chronic, epigastric - Plan: H. pylori breath test, CBC, Comprehensive metabolic panel, Lipase, gi cocktail (Maalox,Lidocaine,Donnatal), sucralfate (CARAFATE) 1 g tablet  Possible peptic ulcer.  Await h pylori and start carafate She will let me know if any worsening and I will be in touch with her pending her labs   Signed Abbe Amsterdam, MD

## 2015-06-28 ENCOUNTER — Other Ambulatory Visit: Payer: Self-pay | Admitting: Family Medicine

## 2015-06-28 DIAGNOSIS — R1013 Epigastric pain: Principal | ICD-10-CM

## 2015-06-28 DIAGNOSIS — G8929 Other chronic pain: Secondary | ICD-10-CM

## 2015-06-28 LAB — H. PYLORI BREATH TEST: H. PYLORI BREATH TEST: NOT DETECTED

## 2015-06-29 ENCOUNTER — Encounter: Payer: Self-pay | Admitting: Family Medicine

## 2015-07-03 ENCOUNTER — Ambulatory Visit: Payer: Commercial Managed Care - PPO | Admitting: Neurology

## 2015-07-04 ENCOUNTER — Ambulatory Visit: Payer: Commercial Managed Care - PPO | Admitting: Neurology

## 2015-07-05 ENCOUNTER — Other Ambulatory Visit: Payer: Self-pay | Admitting: Certified Nurse Midwife

## 2015-07-05 NOTE — Telephone Encounter (Signed)
She had called  In 2016 for refill and still not do mammogram. Will not refill without current mammogram, due to concern with breast changes and increase cancer risk with HRT use.Once I have copy of current can refill

## 2015-07-05 NOTE — Telephone Encounter (Signed)
Medication refill request: Prometrium Last AEX:  09-19-14 Next AEX: 09-20-15 Last MMG (if hormonal medication request): 09-16-13 WNL Refill authorized: please advise

## 2015-07-09 ENCOUNTER — Ambulatory Visit
Admission: RE | Admit: 2015-07-09 | Discharge: 2015-07-09 | Disposition: A | Discharge status: Home / Self Care | Payer: Commercial Managed Care - PPO | Source: Ambulatory Visit | Attending: Family Medicine | Admitting: Family Medicine

## 2015-07-09 DIAGNOSIS — R1013 Epigastric pain: Principal | ICD-10-CM

## 2015-07-09 DIAGNOSIS — G8929 Other chronic pain: Secondary | ICD-10-CM

## 2015-07-27 ENCOUNTER — Encounter: Payer: Self-pay | Admitting: Gastroenterology

## 2015-08-29 ENCOUNTER — Encounter: Payer: Commercial Managed Care - PPO | Admitting: Internal Medicine

## 2015-09-06 ENCOUNTER — Encounter: Payer: Commercial Managed Care - PPO | Admitting: Internal Medicine

## 2015-09-13 ENCOUNTER — Ambulatory Visit: Payer: Commercial Managed Care - PPO | Admitting: Gastroenterology

## 2015-09-20 ENCOUNTER — Encounter: Payer: Self-pay | Admitting: Certified Nurse Midwife

## 2015-09-20 ENCOUNTER — Other Ambulatory Visit: Payer: Self-pay

## 2015-09-20 ENCOUNTER — Ambulatory Visit (INDEPENDENT_AMBULATORY_CARE_PROVIDER_SITE_OTHER): Payer: Commercial Managed Care - PPO | Admitting: Certified Nurse Midwife

## 2015-09-20 VITALS — BP 94/60 | HR 68 | Resp 16 | Ht 63.25 in | Wt 125.0 lb

## 2015-09-20 DIAGNOSIS — N393 Stress incontinence (female) (male): Secondary | ICD-10-CM | POA: Diagnosis not present

## 2015-09-20 DIAGNOSIS — R32 Unspecified urinary incontinence: Secondary | ICD-10-CM

## 2015-09-20 DIAGNOSIS — Z01419 Encounter for gynecological examination (general) (routine) without abnormal findings: Secondary | ICD-10-CM

## 2015-09-20 DIAGNOSIS — Z124 Encounter for screening for malignant neoplasm of cervix: Secondary | ICD-10-CM | POA: Diagnosis not present

## 2015-09-20 DIAGNOSIS — Z1231 Encounter for screening mammogram for malignant neoplasm of breast: Secondary | ICD-10-CM

## 2015-09-20 NOTE — Patient Instructions (Signed)

## 2015-09-20 NOTE — Progress Notes (Signed)
55 y.o. G0P0000 Single  Caucasian Fe here for annual exam. Menopausal on HRT, working well. Denies vaginal bleeding or vaginal dryness. Sees PCP for aex/labs. See Rheumatology for RA.Marland Kitchen Patient having incontinence with the slight urge to urinate, started 8 months ago and has increased to once  every other week. Always happens with urge to urinate. No excessive caffeine use, no UTI symptoms. Not sexually active in a long time.  No other health issues today.  Patient's last menstrual period was 09/06/2012.          Sexually active: Yes.    The current method of family planning is female partner.    Exercising: Yes.    weights & cardio Smoker:  no  Health Maintenance: Pap: 09-13-13 neg HPV HR neg MMG:  09-16-13 category c density,birads 1:neg, pt is scheduled Colonoscopy: 2016 neg f/u 24yrs BMD:   none TDaP:  2010 Shingles: no Pneumonia: no Hep C and HIV: pt believes she may have had it done Labs: pcp Self breast exam: not done   reports that she has never smoked. She has never used smokeless tobacco. She reports that she does not drink alcohol or use illicit drugs.  Past Medical History  Diagnosis Date  . Idiopathic hypersomnia   . Allergy, unspecified not elsewhere classified   . IBS (irritable bowel syndrome)   . 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 Allergy   . Abnormal Pap smear of cervix 1996    Past Surgical History  Procedure Laterality Date  . Shoulder surgery      right  . Tonsillectomy    . Cryotherapy      Current Outpatient Prescriptions  Medication Sig Dispense Refill  . Armodafinil (NUVIGIL) 250 MG tablet Take 1 tablet (250 mg total) by mouth daily. 90 tablet 1  . estradiol (ESTRACE) 0.5 MG tablet Take 1 tablet (0.5 mg total) by mouth daily. 90 tablet 2  . Ibuprofen (ADVIL PO) Take by mouth as needed. Reported on 06/27/2015    . InFLIXimab (REMICADE IV) Inject into the vein.    . Melatonin 3 MG TABS Take by mouth. Reported on 06/27/2015    .  progesterone (PROMETRIUM) 100 MG capsule Take 1 capsule (100 mg total) by mouth daily. 90 capsule 2   No current facility-administered medications for this visit.    Family History  Problem Relation Age of Onset  . Adopted: Yes  . Cancer Mother     stomach  . Heart disease Mother   . Heart attack Mother   . Heart disease Father   . Colon cancer Neg Hx     ROS:  Pertinent items are noted in HPI.  Otherwise, a comprehensive ROS was negative.  Exam:   BP 94/60 mmHg  Pulse 68  Resp 16  Ht 5' 3.25" (1.607 m)  Wt 125 lb (56.7 kg)  BMI 21.96 kg/m2  LMP 09/06/2012 Height: 5' 3.25" (160.7 cm) Ht Readings from Last 3 Encounters:  09/20/15 5' 3.25" (1.607 m)  06/27/15 5' 3.5" (1.613 m)  03/23/15  (1.626 m)    General appearance: alert, cooperative and appears stated age Head: Normocephalic, without obvious abnormality, atraumatic Neck: no adenopathy, supple, symmetrical, trachea midline and thyroid normal to inspection and palpation Lungs: clear to auscultation bilaterally CVAT: negative bilateral Breasts: normal appearance, no masses or tenderness, No nipple retraction or dimpling, No nipple discharge or bleeding, No axillary or supraclavicular adenopathy Heart: regular rate and rhythm Abdomen: soft, non-tender; no  masses,  no organomegaly, negative suprapubic Extremities: extremities normal, atraumatic, no cyanosis or edema Skin: Skin color, texture, turgor normal. No rashes or lesions, warm and dry Lymph nodes: Cervical, supraclavicular, and axillary nodes normal. No abnormal inguinal nodes palpated Neurologic: Grossly normal   Pelvic: External genitalia:  no lesions              Urethra:  normal appearing urethra with no masses, tenderness or lesions  Urethral meatus non tender, no redness  Bladder non tender              Bartholin's and Skene's: normal                 Vagina: normal appearing vagina with normal color and discharge, no lesions              Cervix:  normal,non tender, no lesions              Pap taken: Yes.   Bimanual Exam:  Uterus:  normal size, contour, position, consistency, mobility, non-tender and anteverted              Adnexa: normal adnexa and no mass, fullness, tenderness               Rectovaginal: Confirms               Anus:  normal sphincter tone, no lesions  Chaperone present: yes  A:  Well Woman with normal exam  Menopausal on HRT  Mammogram overdue  Spontaneous incontinence with urge to urinate  R/O UTI  P:   Reviewed health and wellness pertinent to exam  Aware of need to advise if vaginal bleeding. Needs to have mammogram prior to HRT renewal , she still has RX. Has mammogram scheduled.  Discussed not waiting long periods of time prior to urination to see if this helps. Increase water intake, to decrease bladder irritation.   Lab : Urine culture  Pap smear as above with HPVHR   counseled on breast self exam, mammography screening, STD prevention, HIV risk factors and prevention, use and side effects of HRT, adequate intake of calcium and vitamin D, diet and exercise  return annually or prn  An After Visit Summary was printed and given to the patient.

## 2015-09-21 LAB — URINE CULTURE
Colony Count: NO GROWTH
ORGANISM ID, BACTERIA: NO GROWTH

## 2015-09-21 NOTE — Progress Notes (Signed)
Encounter reviewed Miranda Feeser, MD   

## 2015-09-24 LAB — IPS PAP TEST WITH REFLEX TO HPV

## 2015-10-04 ENCOUNTER — Ambulatory Visit
Admission: RE | Admit: 2015-10-04 | Discharge: 2015-10-04 | Disposition: A | Discharge status: Home / Self Care | Payer: Commercial Managed Care - PPO | Source: Ambulatory Visit

## 2015-10-04 ENCOUNTER — Telehealth: Payer: Self-pay | Admitting: Internal Medicine

## 2015-10-04 DIAGNOSIS — Z1231 Encounter for screening mammogram for malignant neoplasm of breast: Secondary | ICD-10-CM

## 2015-10-04 NOTE — Telephone Encounter (Signed)
Last ov 03/22/16 Patient Instructions     Script printed to refill Nuvigil  Script to try Sonata/ zaleplon at bedtime to see if you sleep better   Called spoke with pt. She states she needs a refill on the nuvgil [facility] [address]. I explained to the pt that I would need to send a message to [facility] for approval. She voiced understanding and had no further questions.   Nuvigil 250mg  Take 1 tablet by mouth daily #90 with 1 refill Last refilled 06/21/15  CY please advise  Allergies  Allergen Reactions  . Keflex [Cephalexin] Itching     Current outpatient prescriptions:  .  Armodafinil (NUVIGIL) 250 MG tablet, Take 1 tablet (250 mg total) by mouth daily., Disp: 90 tablet, Rfl: 1 .  estradiol (ESTRACE) 0.5 MG tablet, Take 1 tablet (0.5 mg total) by mouth daily., Disp: 90 tablet, Rfl: 2 .  Ibuprofen (ADVIL PO), Take by mouth as needed. Reported on 06/27/2015, Disp: , Rfl:  .  InFLIXimab (REMICADE IV), Inject into the vein., Disp: , Rfl:  .  Melatonin 3 MG TABS, Take by mouth. Reported on 06/27/2015, Disp: , Rfl:  .  progesterone (PROMETRIUM) 100 MG capsule, Take 1 capsule (100 mg total) by mouth daily., Disp: 90 capsule, Rfl: 2

## 2015-10-05 ENCOUNTER — Other Ambulatory Visit: Payer: Self-pay | Admitting: Internal Medicine

## 2015-10-05 MED ORDER — ARMODAFINIL 250 MG PO TABS: 250.0000 mg | ORAL_TABLET | Freq: Every day | ORAL | Status: DC

## 2015-10-05 MED ORDER — ARMODAFINIL 250 MG PO TABS
250.0000 mg | ORAL_TABLET | Freq: Every day | ORAL | Status: DC
Start: 2015-10-05 — End: 2015-10-05

## 2015-10-05 NOTE — Telephone Encounter (Signed)
Ok to refill 

## 2015-10-05 NOTE — Telephone Encounter (Signed)
Nuvigil rx called into [facility].  Pt aware and voiced no further questions or concerns at this time.

## 2015-10-05 NOTE — Telephone Encounter (Signed)
Called refill to pharmacy

## 2015-10-17 ENCOUNTER — Other Ambulatory Visit: Payer: Self-pay | Admitting: Certified Nurse Midwife

## 2015-10-17 ENCOUNTER — Encounter: Payer: Self-pay | Admitting: Certified Nurse Midwife

## 2015-10-17 DIAGNOSIS — N951 Menopausal and female climacteric states: Secondary | ICD-10-CM

## 2015-10-17 MED ORDER — PROGESTERONE MICRONIZED 100 MG PO CAPS: 100.0000 mg | ORAL_CAPSULE | Freq: Every day | ORAL | Status: DC

## 2015-10-17 MED ORDER — ESTRADIOL 0.5 MG PO TABS: 0.5000 mg | ORAL_TABLET | Freq: Every day | ORAL | Status: DC

## 2015-12-18 ENCOUNTER — Encounter: Payer: Self-pay | Admitting: Internal Medicine

## 2016-03-24 ENCOUNTER — Ambulatory Visit (INDEPENDENT_AMBULATORY_CARE_PROVIDER_SITE_OTHER): Payer: Commercial Managed Care - PPO | Admitting: Internal Medicine

## 2016-03-24 ENCOUNTER — Encounter: Payer: Self-pay | Admitting: Internal Medicine

## 2016-03-24 DIAGNOSIS — G4710 Hypersomnia, unspecified: Secondary | ICD-10-CM

## 2016-03-24 DIAGNOSIS — Z23 Encounter for immunization: Secondary | ICD-10-CM | POA: Diagnosis not present

## 2016-03-24 DIAGNOSIS — R440 Auditory hallucinations: Secondary | ICD-10-CM

## 2016-03-24 DIAGNOSIS — G4700 Insomnia, unspecified: Secondary | ICD-10-CM | POA: Diagnosis not present

## 2016-03-24 MED ORDER — METHYLPHENIDATE HCL ER (CD) 20 MG PO CPCR: 20.0000 mg | ORAL_CAPSULE | ORAL | 0 refills | Status: DC

## 2016-03-24 NOTE — Progress Notes (Signed)
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 She has been on Provigil twice a day, but feels that her cyst sleepiness has been getting a little worse. She gets to bed at a reasonable hour, and although she does have some awakenings at night, and overall she feels that she sleeps and adequate quantity. He feels that her sleepiness is starting to impact her quality of life a little more  03/23/15- 54 yoF never smoker followed for idiopathic hypersomnia Follow for: Still waking up several times.5-6 hours of sleep nightly. Still taking Nuvigil, tolerating well.  NPSG 12/19/01- AHI 0, no snoring, 24% REM, 88.7% sleep efficiency MSLT- 07/05/03- nonspecific hypersomnia, mean latency 3.5 minutes, No SOREM She has been satisfied using Nuvigil 250 mg once daily but still fights daytime sleepiness sometimes at work if quiet. Bedtime around 8:30 PM, up around 4:30 AM to work out. Falls asleep easily with melatonin 3 mg but says that makes her drowsy or in the morning. Wakes many nights between 2 and 3 AM 8045 minutes or so to fall back to sleep. Rare sleep paralysis. Describes occasional episode where if startled, her heart will race and limbs will feel a little weak, questionable cataplexy. Denies thyroid problems.  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 Gets up aroung 4:30am and goes to bed around 9:00pm. Sonata 5 mg/melatonin 3 mg, Nuvigil 250 mg once daily She reports some auditory hallucinations over the past 8 months, once or twice per week at most. Questions if Nuvigil might be causing this. We will remembered remote trial of Adderall. A sister is schizophrenic.  ROS-see HPI   Negative unless "+" Constitutional:    weight loss, night sweats, fevers, chills, +  fatigue, lassitude. HEENT:    headaches, difficulty swallowing, tooth/dental problems, sore throat,       sneezing, itching, ear ache, nasal congestion, post nasal drip, snoring CV:    chest pain, orthopnea, PND, swelling in lower extremities, anasarca,                                                    dizziness, palpitations Resp:   shortness of breath with exertion or at rest.                productive cough,   non-productive cough, coughing up of blood.              change in color of mucus.  wheezing.   Skin:    rash or lesions. GI:  No-   heartburn, indigestion, abdominal pain, nausea, vomiting,e GU: . MS:   joint pain, stiffness, . Neuro-    + auditory hallucination Psych:  change in mood or affect.  depression or anxiety.   memory loss.  OBJ- Physical Exam General- Alert, Oriented, Affect-appropriate, Distress- none acute, + trim Skin- rash-none, lesions- none, excoriation- none Lymphadenopathy- none Head- atraumatic            Eyes- Gross vision intact, PERRLA, conjunctivae and secretions clear            Ears- Hearing, canals-normal            Nose- Clear, no-Septal dev,  mucus, polyps, erosion, perforation             Throat- Mallampati II , mucosa clear , drainage- none, tonsils- atrophic Neck- flexible , trachea midline, no stridor , thyroid nl, carotid no bruit Chest - symmetrical excursion , unlabored           Heart/CV- RRR , no murmur , no gallop  , no rub, nl s1 s2                           - JVD- none , edema- none, stasis changes- none, varices- none           Lung- clear to P&A, wheeze- none, cough- none , dullness-none, rub- none           Chest wall-  Abd-  Br/ Gen/ Rectal- Not done, not indicated Extrem- cyanosis- none, clubbing, none, atrophy- none, strength- nl Neuro- grossly intact to observation

## 2016-03-24 NOTE — Patient Instructions (Addendum)
Script for ritalin/ methylphenidate 20 mg ER to try instead of Nuvigil to see if the auditory hallucinations resolve  Need to discuss Neuro/Psych referral with PCP for problem auditory hallucination with family history of schizophrenia  Flu vax

## 2016-03-26 ENCOUNTER — Ambulatory Visit: Payer: Commercial Managed Care - PPO | Admitting: Rheumatology

## 2016-04-01 DIAGNOSIS — R440 Auditory hallucinations: Secondary | ICD-10-CM | POA: Insufficient documentation

## 2016-04-01 NOTE — Assessment & Plan Note (Signed)
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 She likely will need updated sleep studies eventually. Plan-continued emphasis on good sleep hygiene, naps when necessary, responsibility to drive safely. Because of concern about auditory hallucination she is going to try replacing Nuvigil with Ritalin 20 mg ER as discussed

## 2016-04-01 NOTE — Assessment & Plan Note (Signed)
New onset about 8 months ago. She reports family history a sister with schizophrenia. We will change from Nuvigil to try Ritalin at her request because of this, but it is important that she have Neuro/Psych evaluation on referral from her PCP.

## 2016-04-01 NOTE — Assessment & Plan Note (Signed)
Sonata plus melatonin has worked well enough and been better tolerated than trazodone in the past. Continues attention to good sleep hygiene.

## 2016-04-02 ENCOUNTER — Ambulatory Visit: Payer: Commercial Managed Care - PPO | Admitting: Rheumatology

## 2016-04-08 ENCOUNTER — Ambulatory Visit: Payer: Commercial Managed Care - PPO | Admitting: Rheumatology

## 2016-04-17 ENCOUNTER — Encounter: Payer: Self-pay | Admitting: Internal Medicine

## 2016-04-18 NOTE — Telephone Encounter (Signed)
Patient email:  Dr. Maple HudsonYoung,    I cannot see any difference when taking the Ritalin instead of Nuvigil.     Miranda [name]   CY please advise.  Thanks

## 2016-04-22 NOTE — Telephone Encounter (Signed)
Does either Nuvigil or ritalin help with excessive daytime sleepiness? If so, ask patient which she wants refilled. If neither helps, then just emphasize good sleep habits and take naps when needed.   I have messaged her PCP, Dr Okey Duprerawford, asking her to consider referral for evaluation of complaint of auditory hallucination, which may be separate from the sleepiness issue.

## 2016-04-25 ENCOUNTER — Telehealth: Payer: Self-pay

## 2016-04-25 NOTE — Telephone Encounter (Signed)
Pt. Called because she was having trouble getting her last refill at the [facility] MuirJamie who indicated that the pt. Did not in fact receive all of her 6 months worth of refills, she only received 5. So they went ahead an put in for the pt. To receive the rest of her refills. Nothing further needed at this time.

## 2016-05-14 ENCOUNTER — Ambulatory Visit (INDEPENDENT_AMBULATORY_CARE_PROVIDER_SITE_OTHER): Payer: Commercial Managed Care - PPO | Admitting: Physician Assistant

## 2016-05-14 ENCOUNTER — Ambulatory Visit (INDEPENDENT_AMBULATORY_CARE_PROVIDER_SITE_OTHER): Payer: Commercial Managed Care - PPO

## 2016-05-14 VITALS — BP 102/64 | HR 98 | Temp 98.7°F | Resp 16 | Ht 64.0 in | Wt 122.0 lb

## 2016-05-14 DIAGNOSIS — R1013 Epigastric pain: Secondary | ICD-10-CM

## 2016-05-14 DIAGNOSIS — R0602 Shortness of breath: Secondary | ICD-10-CM | POA: Diagnosis not present

## 2016-05-14 DIAGNOSIS — R1310 Dysphagia, unspecified: Secondary | ICD-10-CM | POA: Diagnosis not present

## 2016-05-14 LAB — POCT CBC
GRANULOCYTE PERCENT: 67.9 % (ref 37–80)
HEMATOCRIT: 42 % (ref 37.7–47.9)
Hemoglobin: 14.7 g/dL (ref 12.2–16.2)
Lymph, poc: 1.8 (ref 0.6–3.4)
MCH, POC: 31.9 pg — AB (ref 27–31.2)
MCHC: 35 g/dL (ref 31.8–35.4)
MCV: 91.3 fL (ref 80–97)
MID (cbc): 0.5 (ref 0–0.9)
MPV: 8.2 fL (ref 0–99.8)
PLATELET COUNT, POC: 150 10*3/uL (ref 142–424)
POC Granulocyte: 4.8 (ref 2–6.9)
POC LYMPH PERCENT: 25.4 %L (ref 10–50)
POC MID %: 6.7 %M (ref 0–12)
RBC: 4.59 M/uL (ref 4.04–5.48)
RDW, POC: 12.6 %
WBC: 7.1 10*3/uL (ref 4.6–10.2)

## 2016-05-14 LAB — IFOBT (OCCULT BLOOD): IMMUNOLOGICAL FECAL OCCULT BLOOD TEST: NEGATIVE

## 2016-05-14 MED ORDER — OMEPRAZOLE 40 MG PO CPDR: 40.0000 mg | DELAYED_RELEASE_CAPSULE | Freq: Every day | ORAL | 2 refills | Status: DC

## 2016-05-14 NOTE — Progress Notes (Signed)
Miranda [name]  MRN: [id number] DOB: [birth year]  Subjective:  Miranda [name] is a 55 y.o. female seen in office today for a chief complaint of  SOB x 1.5 months. She first noticed it during ADLs and sometimes when she is just sitting down. Denies chest pain,  lower leg swelling, palpitations, dizziness, orthopnea, and diaphoresis. Pt notes she does cardio and lifts weights regularly and has no chest pain or SOB during these particular activities.   She is also worried about dysphagia with both liquids and solids x 4 days. Notes she has been having mid epigastric abdominal pain x 12 months with associated weight loss and nausea. Had two episodecs last week of dark tarry stools, which have resolved. Has been evaluated by PCP, Dr. Edilia Bo, for the mid epigastric pain on 02/03/201.Labs unremarkable, RUQ [place] negative, and H.Pylori negative at that time. Pt instructed to discontinue NSAID use at that time and started on carafate. Pt was using chronic NSAIDs at that time (daily x 3 months) and did discontinue them. However, she never started the carafate. She was also given a referral to GI but never set up an appointment because the pain was getting better after she stopped using NSAIDs, however it returned so she did call them this past week though and set up an appointment for 06/10/16 with Dr. Loletha Carrow at [facility]. She has never tried a PPI.   Pt is up to date on both colonoscopy (12/01/2014) and pap smear (09/20/2015). Both were normal.   She does not know a lot of her FH because she was adopted but does have FH of gastric cancer in mother at age 3yo   Review of Systems  Constitutional: Positive for appetite change (decreased) and unexpected weight change (lost 6 lbs over past 2 months). Negative for chills, fatigue and fever.  HENT: Negative for congestion, sinus pain, sinus pressure and sore throat.   Gastrointestinal: Negative for abdominal distention,  constipation, diarrhea, rectal pain and vomiting.  Genitourinary: Negative for difficulty urinating, dysuria, flank pain and pelvic pain.    Patient Active Problem List   Diagnosis Date Noted  . 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 Seronegative arthritis 08/29/2014  . Dyspnea 04/14/2013  . Palpitations 04/14/2013  . 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 Hypersomnia 07/02/2007  . ALLERGY 05/10/2007    Current Outpatient Prescriptions on File Prior to Visit  Medication Sig Dispense Refill  . Armodafinil (NUVIGIL) 250 MG tablet Take 1 tablet (250 mg total) by mouth daily. 90 tablet 3  . estradiol (ESTRACE) 0.5 MG tablet Take 1 tablet (0.5 mg total) by mouth daily. 90 tablet 3  . Ibuprofen (ADVIL PO) Take by mouth as needed. Reported on 06/27/2015    . Melatonin 3 MG TABS Take by mouth. Reported on 06/27/2015    . progesterone (PROMETRIUM) 100 MG capsule Take 1 capsule (100 mg total) by mouth daily. 90 capsule 3  . zaleplon (SONATA) 5 MG capsule Take 5 mg by mouth at bedtime as needed for sleep.     No current facility-administered medications on file prior to visit.     Allergies  Allergen Reactions  . Keflex [Cephalexin] Itching   Social History   Social History  . Marital status: Single    Spouse name: N/A  . Number of children: N/A  . Years of education: N/A   Occupational History  . Not on file.   Social History Main Topics  . Smoking status: Never Smoker  .  Smokeless tobacco: Never Used  . Alcohol use No  . Drug use: No  . Sexual activity: Yes    Partners: Female    Birth control/ protection: None   Other Topics Concern  . Not on file   Social History Narrative  . No narrative on file   Past Surgical History:  Procedure Laterality Date  . CRYOTHERAPY    . SHOULDER SURGERY     right  . TONSILLECTOMY        Objective:  BP 102/64   Pulse 98   Temp 98.7 F (37.1 C) (Oral)   Resp 16   Ht '5\' 4"'  (1.626 m)   Wt 122 lb (55.3  kg)   LMP 09/06/2012   SpO2 99%   BMI 20.94 kg/m   Physical Exam  Constitutional: She is oriented to person, place, and time and well-developed, well-nourished, and in no distress.  HENT:  Head: Normocephalic and atraumatic.  Nose: Nose normal.  Mouth/Throat: Uvula is midline, oropharynx is clear and moist and mucous membranes are normal.  Eyes: Conjunctivae are normal.  Neck: Normal range of motion.  Cardiovascular: Normal rate, regular rhythm, normal heart sounds and intact distal pulses.   Pulmonary/Chest: Effort normal and breath sounds normal.  Abdominal: Soft. Normal appearance and bowel sounds are normal. She exhibits no distension. There is tenderness in the right lower quadrant and epigastric area. There is no CVA tenderness, no tenderness at McBurney's point and negative Murphy's sign.  Genitourinary: Rectum normal.  Neurological: She is alert and oriented to person, place, and time. Gait normal.  Skin: Skin is warm and dry.  Psychiatric: Affect normal.  Vitals reviewed.  Wt Readings from Last 3 Encounters:  05/14/16 122 lb (55.3 kg)  03/24/16 129 lb 3.2 oz (58.6 kg)  09/20/15 125 lb (56.7 kg)     Results for orders placed or performed in visit on 05/14/16 (from the past 24 hour(s))  POCT CBC     Status: Abnormal   Collection Time: 05/14/16  9:09 AM  Result Value Ref Range   WBC 7.1 4.6 - 10.2 K/uL   Lymph, poc 1.8 0.6 - 3.4   POC LYMPH PERCENT 25.4 10 - 50 %L   MID (cbc) 0.5 0 - 0.9   POC MID % 6.7 0 - 12 %M   POC Granulocyte 4.8 2 - 6.9   Granulocyte percent 67.9 37 - 80 %G   RBC 4.59 4.04 - 5.48 M/uL   Hemoglobin 14.7 12.2 - 16.2 g/dL   HCT, POC 42.0 37.7 - 47.9 %   MCV 91.3 80 - 97 fL   MCH, POC 31.9 (A) 27 - 31.2 pg   MCHC 35.0 31.8 - 35.4 g/dL   RDW, POC 12.6 %   Platelet Count, POC 150 142 - 424 K/uL   MPV 8.2 0 - 99.8 fL  IFOBT POC (occult bld, rslt in office)     Status: None   Collection Time: 05/14/16  9:35 AM  Result Value Ref Range   IFOBT  Negative    Dg Chest 2 View  Result Date: 05/14/2016 CLINICAL DATA:  Shortness of breath for 1.5 months. Dysphagia for 1 week. EXAM: CHEST  2 VIEW COMPARISON:  03/16/2013 FINDINGS: The cardiomediastinal silhouette is within normal limits. The lungs remain hyperinflated and clear. There is no evidence of pleural effusion or pneumothorax. No acute osseous abnormality is identified. IMPRESSION: No active cardiopulmonary disease. Electronically Signed   By: Logan Bores M.D.   On: 05/14/2016 09:13  EKG shows sinus rhythm at 79 bpm. No acute ST or T wave changes. Comparible to prior EKG from 03/16/2013. Findings presented to Dr. Carlota Raspberry.   Assessment and Plan :  This case was precepted with Dr. Carlota Raspberry.   1. SOB (shortness of breath) - DG Chest 2 View; Future - EKG 12-Lead  2. Dysphagia, unspecified type - IFOBT POC (occult bld, rslt in office) 3. Abdominal pain, epigastric Labs pending, pt need further evaluation by GI for possible endoscopy. Will contact [facility] once results complete and discuss pt's case in order to see if she needs to be sooner than her scheduled appointment on 06/10/16.Will start on PPI in the meantime.  Pt agrees to treatment plan.Instructed to seek care sooner if symptoms worsen or she develops any new concerning symptoms. Will follow up with PCP, Dr. Edilia Bo.   - POCT CBC - IFOBT POC (occult bld, rslt in office) - CMP14+EGFR - omeprazole (PRILOSEC) 40 MG capsule; Take 1 capsule (40 mg total) by mouth daily.  Dispense: 30 capsule; Refill: [place] PA-C  [facility] 05/14/2016 9:44 AM

## 2016-05-14 NOTE — Progress Notes (Deleted)
   Miranda [name]  MRN: [id number] DOB: 1960/07/26  Subjective:  Miranda [name] is a 55 y.o. female seen in office today for a chief complaint of   Review of Systems  Patient Active Problem List   Diagnosis Date Noted  . 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 Seronegative arthritis 08/29/2014  . Dyspnea 04/14/2013  . Palpitations 04/14/2013  . 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 Hypersomnia 07/02/2007  . ALLERGY 05/10/2007    Current Outpatient Prescriptions on File Prior to Visit  Medication Sig Dispense Refill  . Armodafinil (NUVIGIL) 250 MG tablet Take 1 tablet (250 mg total) by mouth daily. 90 tablet 3  . estradiol (ESTRACE) 0.5 MG tablet Take 1 tablet (0.5 mg total) by mouth daily. 90 tablet 3  . Ibuprofen (ADVIL PO) Take by mouth as needed. Reported on 06/27/2015    . Melatonin 3 MG TABS Take by mouth. Reported on 06/27/2015    . progesterone (PROMETRIUM) 100 MG capsule Take 1 capsule (100 mg total) by mouth daily. 90 capsule 3  . zaleplon (SONATA) 5 MG capsule Take 5 mg by mouth at bedtime as needed for sleep.     No current facility-administered medications on file prior to visit.     Allergies  Allergen Reactions  . Keflex [Cephalexin] Itching     Objective:  BP 102/64   Pulse 98   Temp 98.7 F (37.1 C) (Oral)   Resp 16   Ht '5\' 4"'$  (1.626 m)   Wt 122 lb (55.3 kg)   LMP 09/06/2012   SpO2 99%   BMI 20.94 kg/m   Physical Exam  Assessment and Plan :   1. SOB (shortness of breath) *** - POCT CBC - IFOBT POC (occult bld, rslt in office) - DG Chest 2 View; Future - EKG 12-Lead - CMP14+EGFR    Tenna Delaine PA-C  [facility] 05/14/2016 9:06 AM

## 2016-05-14 NOTE — Patient Instructions (Addendum)
  Take omeprazole daily. I will contact your GI doctor when I have all of your test results. You can follow up with Dr. Dallas Sch[place]. If symptoms worsen, seek care immediately.   Thank you for letting me participate in your health and well being.    IF you received an x-ray today, you will receive an invoice from [facility]. Please contact [facility] at [phone] with questions or concerns regarding your invoice.   IF you received labwork today, you will receive an invoice from [facility]. Please contact [facility] at [phone] with questions or concerns regarding your invoice.   Our billing staff will not be able to assist you with questions regarding bills from these companies.  You will be contacted with the lab results as soon as they are available. The fastest way to get your results is to activate your My Chart account. Instructions are located on the last page of this paperwork. If you have not heard from us regarding the results in 2 weeks, please contact this office.      Peptic Ulcer A peptic ulcer is a painful sore in the lining of your esophagus, stomach, or in the first part of your small intestine. The main causes of an ulcer can be:  An infection.  Using certain pain medicines too often or too much.  Smoking. HOME CARE  Avoid smoking, alcohol, and caffeine.  Avoid foods that bother you.  Only take medicine as told by your doctor. Do not take any medicines your doctor has not approved.  Keep all doctor visits as told. GET HELP IF:  You do not get better in 7 days after starting treatment.  You keep having an upset stomach (indigestion) or heartburn. GET HELP RIGHT AWAY IF:  You have sudden, sharp, or lasting belly (abdominal) pain.  You have bloody, black, or tarry poop (stool).  You throw up (vomit) blood or your throw up looks like coffee grounds.  You get light-headed, weak, or feel like you will pass out (faint).  You get sweaty or  feel sticky and cold to the touch (clammy). MAKE SURE YOU:   Understand these instructions.  Will watch your condition.  Will get help right away if you are not doing well or get worse. This information is not intended to replace advice given to you by your health care provider. Make sure you discuss any questions you have with your health care provider. Document Released: 08/06/2009 Document Revised: 06/02/2014 Document Reviewed: 02/10/2015 Elsevier Interactive Patient Education  2017 [employer]

## 2016-05-15 LAB — CMP14+EGFR
A/G RATIO: 1.9 (ref 1.2–2.2)
ALBUMIN: 4.3 g/dL (ref 3.5–5.5)
ALT: 13 IU/L (ref 0–32)
AST: 16 IU/L (ref 0–40)
Alkaline Phosphatase: 103 IU/L (ref 39–117)
BUN/Creatinine Ratio: 18 (ref 9–23)
BUN: 18 mg/dL (ref 6–24)
Bilirubin Total: 0.5 mg/dL (ref 0.0–1.2)
CALCIUM: 9.6 mg/dL (ref 8.7–10.2)
CO2: 27 mmol/L (ref 18–29)
CREATININE: 0.98 mg/dL (ref 0.57–1.00)
Chloride: 100 mmol/L (ref 96–106)
GFR calc non Af Amer: 65 mL/min/{1.73_m2} (ref >59)
GFR, EST AFRICAN AMERICAN: 75 mL/min/{1.73_m2} (ref >59)
GLOBULIN, TOTAL: 2.3 g/dL (ref 1.5–4.5)
Glucose: 84 mg/dL (ref 65–99)
POTASSIUM: 4.3 mmol/L (ref 3.5–5.2)
SODIUM: 141 mmol/L (ref 134–144)
TOTAL PROTEIN: 6.6 g/dL (ref 6.0–8.5)

## 2016-06-05 ENCOUNTER — Encounter: Payer: Self-pay | Admitting: Internal Medicine

## 2016-06-05 ENCOUNTER — Other Ambulatory Visit: Payer: Self-pay | Admitting: Internal Medicine

## 2016-06-06 MED ORDER — ARMODAFINIL 250 MG PO TABS: 250.0000 mg | ORAL_TABLET | Freq: Every day | ORAL | 1 refills | Status: DC

## 2016-06-06 NOTE — Telephone Encounter (Signed)
Nuvigil 250mg  #90 with 1 additional [facility] spoke with pharmacist Asher MuirJamie Med list has been updated E-mail sent to patient informing her of the above

## 2016-06-10 ENCOUNTER — Encounter: Payer: Self-pay | Admitting: Gastroenterology

## 2016-06-10 ENCOUNTER — Ambulatory Visit (INDEPENDENT_AMBULATORY_CARE_PROVIDER_SITE_OTHER): Payer: Commercial Managed Care - PPO | Admitting: Gastroenterology

## 2016-06-10 ENCOUNTER — Telehealth: Payer: Self-pay | Admitting: Internal Medicine

## 2016-06-10 VITALS — BP 110/70 | HR 88 | Ht 63.0 in | Wt 126.4 lb

## 2016-06-10 DIAGNOSIS — G8929 Other chronic pain: Secondary | ICD-10-CM

## 2016-06-10 DIAGNOSIS — R1013 Epigastric pain: Secondary | ICD-10-CM

## 2016-06-10 NOTE — Patient Instructions (Signed)
If you are age 56 or older, your body mass index should be between 23-30. Your Body mass index is 22.39 kg/m. If this is out of the aforementioned range listed, please consider follow up with your Primary Care Provider.  If you are age 56 or younger, your body mass index should be between 19-25. Your Body mass index is 22.39 kg/m. If this is out of the aformentioned range listed, please consider follow up with your Primary Care Provider.   You have been scheduled for an endoscopy. Please follow written instructions given to you at your visit today. If you use inhalers (even only as needed), please bring them with you on the day of your procedure. Your physician has requested that you go to www.startemmi.com and enter the access code given to you at your visit today. This web site gives a general overview about your procedure. However, you should still follow specific instructions given to you by our office regarding your preparation for the procedure.  Thank you for choosing [facility]  Dr Amada JupiterHenry Danis III

## 2016-06-10 NOTE — Progress Notes (Signed)
[facility] Consult Note:  History: Miranda [name] July 06/10/2016  Referring physician: Abbe AmsterdamOPLAND,JESSICA, MD  Reason for consult/chief complaint: Abdominal Pain (x 3 months) and Nausea   Subjective  HPI:  This is a 56 year old woman referred by primary care for about 3 months of chronic epigastric pain. It occurred earlier last year, and seemed to get better. It is dull and aching and nonradiating. It does not seem changed with food time of day or position. Her bowel habits are regular with no rectal bleeding, and she had a normal screening colonoscopy with Dr. Juanda ChanceBrodie in July 2016. Just before Christmas there was an episode of acute onset sharp nonradiating chest pain that occurred soon after drinking some soda, and lasted about 24-48 hours before finally subsiding. She was seen in urgent care for that.  Her H. pylori breath test was negative (unknown if on PPI at the time), and she stopped using NSAIDs regularly, but was still concerned about possible ulcer. She is adopted but learned that her biological mother had stomach cancer. ROS:  Review of Systems  Constitutional: Negative for appetite change and unexpected weight change.  HENT: Negative for mouth sores and voice change.   Eyes: Negative for pain and redness.  Respiratory: Negative for cough and shortness of breath.   Cardiovascular: Negative for chest pain and palpitations.  Genitourinary: Negative for dysuria and hematuria.  Musculoskeletal: Negative for arthralgias and myalgias.  Skin: Negative for pallor and rash.  Neurological: Negative for weakness and headaches.       Chronic sleep disturbance, see recent pulmonary note  Hematological: Negative for adenopathy.     Past Medical History: Past Medical History:  Diagnosis Date  . Abnormal Pap smear of cervix 1996  . Allergy   . Allergy, unspecified not elsewhere classified   . IBS (irritable bowel syndrome)   . Idiopathic hypersomnia   . 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 CRYOTHERAPY    . SHOULDER SURGERY     right  . TONSILLECTOMY       Family History: Family History  Problem Relation Age of Onset  . Adopted: Yes  . Cancer Mother     stomach  . Heart disease Mother   . Heart attack Mother   . Heart disease Father   . Colon cancer Neg Hx     Social History: Social History   Social History  . Marital status: Single    Spouse name: N/A  . Number of children: N/A  . Years of education: N/A   Social History Main Topics  . Smoking status: Never Smoker  . Smokeless tobacco: Never Used  . Alcohol use No  . Drug use: No  . Sexual activity: Yes    Partners: Female    Birth control/ protection: None   Other Topics Concern  . None   Social History Narrative  . None    Allergies: Allergies  Allergen Reactions  . Keflex [Cephalexin] Itching    Outpatient Meds: Current Outpatient Prescriptions  Medication Sig Dispense Refill  . Armodafinil (NUVIGIL) 250 MG tablet Take 1 tablet (250 mg total) by mouth daily. 90 tablet 1  . estradiol (ESTRACE) 0.5 MG tablet Take 1 tablet (0.5 mg total) by mouth daily. 90 tablet 3  . Ibuprofen (ADVIL PO) Take by mouth as needed. Reported on 06/27/2015    . Melatonin 3 MG TABS Take by mouth. Reported  on 06/27/2015    . progesterone (PROMETRIUM) 100 MG capsule Take 1 capsule (100 mg total) by mouth daily. 90 capsule 3  . zaleplon (SONATA) 5 MG capsule Take 5 mg by mouth at bedtime as needed for sleep.    Marland Kitchen omeprazole (PRILOSEC) 40 MG capsule Take 1 capsule (40 mg total) by mouth daily. 30 capsule 2   No current facility-administered medications for this visit.       ___________________________________________________________________ Objective   Exam:  BP 110/70   Pulse 88   Ht 5\' 3"  (1.6 m)   Wt 126 lb 6.4 oz (57.3 kg)   LMP 09/06/2012   SpO2 98%   BMI 22.39  kg/m    General: this is a(n) Well-appearing middle-aged woman with good muscle mass   Eyes: sclera anicteric, no redness  ENT: oral mucosa moist without lesions, no cervical or supraclavicular lymphadenopathy, good dentition  CV: RRR without murmur, S1/S2, no JVD, no peripheral edema  Resp: clear to auscultation bilaterally, normal RR and effort noted  GI: soft, mild epigastric tenderness, with active bowel sounds. No guarding or palpable organomegaly noted.  Skin; warm and dry, no rash or jaundice noted  Neuro: awake, alert and oriented x 3. Normal gross motor function and fluent speech  Labs:  Negative urea breath test to 06/27/15  Radiologic Studies: No abdominal imaging  Assessment: Encounter Diagnosis  Name Primary?  . Abdominal pain, chronic, epigastric Yes    Chronic vague epigastric discomfort without associated symptoms. H. pylori negative and she no longer uses NSAIDs, making ulcer less likely. She does not have red flag symptoms to suggest  malignancy.  Plan:  EGD  The benefits and risks of the planned procedure were described in detail with the patient or (when appropriate) their health care proxy.  Risks were outlined as including, but not limited to, bleeding, infection, perforation, adverse medication reaction leading to cardiac or pulmonary decompensation, or pancreatitis (if ERCP).  The limitation of incomplete mucosal visualization was also discussed.  No guarantees or warranties were given.  Stop PPI, since the last 3 weeks of being on it does not seem to have helped. Thank you for the courtesy of this consult.  Please call me with any questions or concerns.  Charlie Pitter III  CCAbbe Amsterdam, MD

## 2016-06-10 NOTE — Telephone Encounter (Signed)
PA request received for Nuvigil. Called [facility] at [phone] to initiate PA, this has been approved for 30day supply from 05/11/2016-06/10/2017.   Pharmacy and pt aware.  Nothing further needed.

## 2016-06-17 ENCOUNTER — Encounter: Payer: Self-pay | Admitting: Gastroenterology

## 2016-06-17 ENCOUNTER — Ambulatory Visit (AMBULATORY_SURGERY_CENTER): Payer: Commercial Managed Care - PPO | Admitting: Gastroenterology

## 2016-06-17 VITALS — BP 103/75 | HR 78 | Temp 98.4°F | Resp 14 | Ht 63.0 in | Wt 126.0 lb

## 2016-06-17 DIAGNOSIS — R1013 Epigastric pain: Secondary | ICD-10-CM | POA: Diagnosis not present

## 2016-06-17 MED ORDER — SODIUM CHLORIDE 0.9 % IV SOLN: 500.0000 mL | INTRAVENOUS | Status: DC

## 2016-06-17 NOTE — Patient Instructions (Signed)
YOU HAD AN ENDOSCOPIC PROCEDURE TODAY AT THE [facility]:   Refer to the procedure report that was given to you for any specific questions about what was found during the examination.  If the procedure report does not answer your questions, please call your gastroenterologist to clarify.  If you requested that your care partner not be given the details of your procedure findings, then the procedure report has been included in a sealed envelope for you to review at your convenience later.  YOU SHOULD EXPECT: Some feelings of bloating in the abdomen. Passage of more gas than usual.  Walking can help get rid of the air that was put into your GI tract during the procedure and reduce the bloating. If you had a lower endoscopy (such as a colonoscopy or flexible sigmoidoscopy) you may notice spotting of blood in your stool or on the toilet paper. If you underwent a bowel prep for your procedure, you may not have a normal bowel movement for a few days.  Please Note:  You might notice some irritation and congestion in your nose or some drainage.  This is from the oxygen used during your procedure.  There is no need for concern and it should clear up in a day or so.  SYMPTOMS TO REPORT IMMEDIATELY:   Following lower endoscopy (colonoscopy or flexible sigmoidoscopy):  Excessive amounts of blood in the stool  Significant tenderness or worsening of abdominal pains  Swelling of the abdomen that is new, acute  Fever of 100F or higher   Following upper endoscopy (EGD)  Vomiting of blood or coffee ground material  New chest pain or pain under the shoulder blades  Painful or persistently difficult swallowing  New shortness of breath  Fever of 100F or higher  Black, tarry-looking stools  For urgent or emergent issues, a gastroenterologist can be reached at any hour by calling [phone].   DIET:  We do recommend a small meal at first, but then you may proceed to your regular diet.  Drink  plenty of fluids but you should avoid alcoholic beverages for 24 hours.  ACTIVITY:  You should plan to take it easy for the rest of today and you should NOT DRIVE or use heavy machinery until tomorrow (because of the sedation medicines used during the test).    FOLLOW UP: Our staff will call the number listed on your records the next business day following your procedure to check on you and address any questions or concerns that you may have regarding the information given to you following your procedure. If we do not reach you, we will leave a message.  However, if you are feeling well and you are not experiencing any problems, there is no need to return our call.  We will assume that you have returned to your regular daily activities without incident.  If any biopsies were taken you will be contacted by phone or by letter within the next 1-3 weeks.  Please call us at [phone] if you have not heard about the biopsies in 3 weeks.    SIGNATURES/CONFIDENTIALITY: You and/or your care partner have signed paperwork which will be entered into your electronic medical record.  These signatures attest to the fact that that the information above on your After Visit Summary has been reviewed and is understood.  Full responsibility of the confidentiality of this discharge information lies with you and/or your care-partner. 

## 2016-06-17 NOTE — Progress Notes (Signed)
Patient awakening, vss report to rn 

## 2016-06-17 NOTE — Op Note (Signed)
[facility] Patient Name: Timica Marcom Procedure Date: 06/17/2016 8:29 AM MRN: [id number] Endoscopist: Sherilyn Cooter L. Myrtie Neither , MD Age: 56 Referring MD:  Date of Birth: [birth year] Gender: Female Account #: [id number] Procedure:                Upper GI endoscopy Indications:              Epigastric abdominal pain, Unexplained chest pain Medicines:                Monitored Anesthesia Care Procedure:                Pre-Anesthesia Assessment:                           - Prior to the procedure, a History and Physical                            was performed, and patient medications and                            allergies were reviewed. The patient's tolerance of                            previous anesthesia was also reviewed. The risks                            and benefits of the procedure and the sedation                            options and risks were discussed with the patient.                            All questions were answered, and informed consent                            was obtained. Prior Anticoagulants: The patient has                            taken no previous anticoagulant or antiplatelet                            agents. ASA Grade Assessment: II - A patient with                            mild systemic disease. After reviewing the risks                            and benefits, the patient was deemed in                            satisfactory condition to undergo the procedure.                           After obtaining informed consent, the endoscope was  passed under direct vision. Throughout the                            procedure, the patient's blood pressure, pulse, and                            oxygen saturations were monitored continuously. The                            Model GIF-[id number]) scope was introduced                            through the mouth, and advanced to the second part   of duodenum. The upper GI endoscopy was                            accomplished without difficulty. The patient                            tolerated the procedure well. Scope In: Scope Out: Findings:                 The larynx was normal.                           The esophagus was normal.                           The entire examined stomach was normal. Biopsies                            were taken with a cold forceps for histology.                           The cardia and gastric fundus were normal on                            retroflexion.                           The examined duodenum was normal. Complications:            No immediate complications. Estimated Blood Loss:     Estimated blood loss: none. Impression:               - Normal larynx.                           - Normal esophagus.                           - Normal stomach. Biopsied.                           - Normal examined duodenum. Recommendation:           - Patient has a contact number available for  emergencies. The signs and symptoms of potential                            delayed complications were discussed with the                            patient. Return to normal activities tomorrow.                            Written discharge instructions were provided to the                            patient.                           - Resume previous diet.                           - Continue present medications.                           - Await pathology results. Abbey Veith L. Myrtie Neitheranis, MD 06/17/2016 8:46:04 AM This report has been signed electronically.

## 2016-06-17 NOTE — Progress Notes (Signed)
Called to room to assist during endoscopic procedure.  Patient ID and intended procedure confirmed with present staff. Received instructions for my participation in the procedure from the performing physician.  

## 2016-06-18 ENCOUNTER — Telehealth: Payer: Self-pay

## 2016-06-18 NOTE — Telephone Encounter (Signed)
  Follow up Call-  Call back number 06/17/2016 12/01/2014  Post procedure Call Back phone  # [phone]  Permission to leave phone message Yes Yes  Some recent data might be hidden     Patient questions:  Do you have a fever, pain , or abdominal swelling? No. Pain Score  0 *  Have you tolerated food without any problems? Yes.    Have you been able to return to your normal activities? Yes.    Do you have any questions about your discharge instructions: Diet   No. Medications  No. Follow up visit  No.  Do you have questions or concerns about your Care? No.  Actions: * If pain score is 4 or above: No action needed, pain <4.

## 2016-06-18 NOTE — Telephone Encounter (Signed)
Name identifier. Left voicemail, will call back later today.

## 2016-06-22 ENCOUNTER — Encounter: Payer: Self-pay | Admitting: Gastroenterology

## 2016-09-03 ENCOUNTER — Encounter: Payer: Self-pay | Admitting: Family Medicine

## 2016-09-25 ENCOUNTER — Ambulatory Visit (INDEPENDENT_AMBULATORY_CARE_PROVIDER_SITE_OTHER): Payer: Commercial Managed Care - PPO | Admitting: Certified Nurse Midwife

## 2016-09-25 ENCOUNTER — Encounter: Payer: Self-pay | Admitting: Certified Nurse Midwife

## 2016-09-25 VITALS — BP 110/74 | HR 70 | Resp 16 | Ht 63.25 in | Wt 123.0 lb

## 2016-09-25 DIAGNOSIS — Z01419 Encounter for gynecological examination (general) (routine) without abnormal findings: Secondary | ICD-10-CM

## 2016-09-25 DIAGNOSIS — Z124 Encounter for screening for malignant neoplasm of cervix: Secondary | ICD-10-CM

## 2016-09-25 DIAGNOSIS — N951 Menopausal and female climacteric states: Secondary | ICD-10-CM

## 2016-09-25 NOTE — Progress Notes (Signed)
56 y.o. G0P0000 Single  Caucasian Fe here for annual exam. Menopausal on HRT. Occasional hot flashes, no night sweats or vaginal dryness. Sees Dr. Dallas Schimkeopeland PCP for aex and labs. Sees Dr.Young for pulmonary medication . All stable. No partner change or STD concerns. No other health issues today. Planning trip to [place]!  Patient's last menstrual period was 09/06/2012.          Sexually active: Yes.    The current method of family planning is post menopausal status & has female partner Exercising: Yes.    weights & cardio Smoker:  no  Health Maintenance: Pap:  09-13-13 neg HPV HR neg, 09-20-15 neg no endos History of Abnormal Pap: yes, cryo MMG:  10-04-15 category c density birads 1:neg Self Breast exams: occ Colonoscopy:  2016 neg f/u 305yrs BMD:   none TDaP:  2010 Shingles: not done Pneumonia: not done Hep C and HIV: pt believes she had them done Labs: none   reports that she has never smoked. She has never used smokeless tobacco. She reports that she does not drink alcohol or use drugs.  Past Medical History:  Diagnosis Date  . Abnormal Pap smear of cervix 1996  . Allergy   . Allergy, unspecified not elsewhere classified   . IBS (irritable bowel syndrome)   . Idiopathic hypersomnia   . 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 CRYOTHERAPY    . SHOULDER SURGERY     right  . TONSILLECTOMY      Current Outpatient Prescriptions  Medication Sig Dispense Refill  . Armodafinil (NUVIGIL) 250 MG tablet Take 1 tablet (250 mg total) by mouth daily. 90 tablet 1  . estradiol (ESTRACE) 0.5 MG tablet Take 1 tablet (0.5 mg total) by mouth daily. 90 tablet 3  . Ibuprofen (ADVIL PO) Take by mouth as needed. Reported on 06/27/2015    . Melatonin 3 MG TABS Take by mouth. Reported on 06/27/2015    . progesterone (PROMETRIUM) 100 MG capsule Take 1 capsule (100 mg total) by mouth daily. 90 capsule 3   No current  facility-administered medications for this visit.     Family History  Problem Relation Age of Onset  . Adopted: Yes  . Cancer Mother     stomach  . Heart disease Mother   . Heart attack Mother   . Heart disease Father   . Colon cancer Neg Hx     ROS:  Pertinent items are noted in HPI.  Otherwise, a comprehensive ROS was negative.  Exam:   BP 110/74   Pulse 70   Resp 16   Ht 5' 3.25" (1.607 m)   Wt 123 lb (55.8 kg)   LMP 09/06/2012   BMI 21.62 kg/m  Height: 5' 3.25" (160.7 cm) Ht Readings from Last 3 Encounters:  09/25/16 5' 3.25" (1.607 m)  06/17/16 5\' 3"  (1.6 m)  06/10/16 5\' 3"  (1.6 m)    General appearance: alert, cooperative and appears stated age Head: Normocephalic, without obvious abnormality, atraumatic Neck: no adenopathy, supple, symmetrical, trachea midline and thyroid normal to inspection and palpation Lungs: clear to auscultation bilaterally Breasts: normal appearance, no masses or tenderness, No nipple retraction or dimpling, No nipple discharge or bleeding, No axillary or supraclavicular adenopathy Heart: regular rate and rhythm Abdomen: soft, non-tender; no masses,  no organomegaly Extremities: extremities normal, atraumatic, no cyanosis or edema Skin: Skin color, texture, turgor normal. No rashes or lesions  Lymph nodes: Cervical, supraclavicular, and axillary nodes normal. No abnormal inguinal nodes palpated Neurologic: Grossly normal   Pelvic: External genitalia:  no lesions              Urethra:  normal appearing urethra with no masses, tenderness or lesions              Bartholin's and Skene's: normal                 Vagina: normal appearing vagina with normal color and discharge, no lesions              Cervix: no cervical motion tenderness, no lesions and nulliparous appearance              Pap taken: Yes.   Bimanual Exam:  Uterus:  normal size, contour, position, consistency, mobility, non-tender              Adnexa: normal adnexa and no mass,  fullness, tenderness               Rectovaginal: Confirms               Anus:  normal sphincter tone, no lesions  Chaperone present: yes  A:  Well Woman with normal exam  Menopausal on HRT working well for symptom relief, desires continuance  Mammogram due, has scheduled    P:   Reviewed health and wellness pertinent to exam  Aware of importance of advising if vaginal bleeding  Risks/benefits of HRT reviewed and warning signs, desires refill  Rx Estrace 0.5mg  order not palced  Rx Prometrium 100mg  order not placed until mammogram in, patient will call when completed, has enough medication until then   Pap smear: yes  counseled on breast self exam, mammography screening, use and side effects of HRT, adequate intake of calcium and vitamin D, diet and exercise  return annually or prn  An After Visit Summary was printed and given to the patient.

## 2016-09-25 NOTE — Patient Instructions (Signed)

## 2016-09-30 LAB — IPS PAP TEST WITH HPV

## 2016-10-15 ENCOUNTER — Ambulatory Visit (INDEPENDENT_AMBULATORY_CARE_PROVIDER_SITE_OTHER): Payer: Commercial Managed Care - PPO | Admitting: Family Medicine

## 2016-10-15 VITALS — BP 109/65 | HR 83 | Temp 98.7°F | Ht 63.5 in | Wt 125.0 lb

## 2016-10-15 DIAGNOSIS — Z0000 Encounter for general adult medical examination without abnormal findings: Secondary | ICD-10-CM

## 2016-10-15 DIAGNOSIS — Z1159 Encounter for screening for other viral diseases: Secondary | ICD-10-CM | POA: Diagnosis not present

## 2016-10-15 DIAGNOSIS — Z130 Encounter for screening for diseases of the blood and blood-forming organs and certain disorders involving the immune mechanism: Secondary | ICD-10-CM

## 2016-10-15 DIAGNOSIS — Z131 Encounter for screening for diabetes mellitus: Secondary | ICD-10-CM | POA: Diagnosis not present

## 2016-10-15 DIAGNOSIS — Z13220 Encounter for screening for lipoid disorders: Secondary | ICD-10-CM

## 2016-10-15 DIAGNOSIS — F341 Dysthymic disorder: Secondary | ICD-10-CM | POA: Diagnosis not present

## 2016-10-15 DIAGNOSIS — Z1329 Encounter for screening for other suspected endocrine disorder: Secondary | ICD-10-CM

## 2016-10-15 MED ORDER — ESCITALOPRAM OXALATE 10 MG PO TABS: 10.0000 mg | ORAL_TABLET | Freq: Every day | ORAL | 6 refills | Status: DC

## 2016-10-15 NOTE — Patient Instructions (Signed)
It was a pleasure to see you today- enjoy your summer beach trip!   We will get screening labs for you today- I will be in touch with these results asap  Please start on the lexapro at 10 mg once a day- after 2-3 weeks please send me an email update.  We may increase to 20 mg in a couple of months  Let me know right away if you are not doing ok with this medication

## 2016-10-15 NOTE — Progress Notes (Addendum)
[facility] [address], [address] [place], [place] [address] [phone]  Date:  10/15/2016   Name:  Miranda [name]   DOB:  December 26, 1960   MRN:  [id number]  PCP:  Pearline Cables, MD    Chief Complaint: Annual Exam   History of Present Illness:  Miranda [name] is a 56 y.o. very pleasant female patient who presents with the following:  Last seen by myself in February of 2017 Here today seeking a CPE Pap done earlier this month by Basilia Jumbo at [facility] health care   She had sent me an email about maybe starting on an antidepressant.  Her therapist had suggested lexapro She did take some sort of med- maybe cymbalta- a long time ago  She has noted sx of milder depression for many years She had a hard time this past winter.  This past winter was especially bad She will notice lack of motivation and interest in things, poor energy anxiety is not really a problem for her The nuvigil does help her with fatigue  Her job is ok, her family is doing well She does get as much exercise as she is able   Patient Active Problem List   Diagnosis Date Noted  . 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 Seronegative arthritis 08/29/2014  . Dyspnea 04/14/2013  . Palpitations 04/14/2013  . 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 Ongoing use of possibly toxic medication 03/11/2011  . 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 ALLERGY 05/10/2007    Past Medical History:  Diagnosis Date  . Abnormal Pap smear of cervix 1996  . Allergy   . Allergy, unspecified not elsewhere classified   . IBS (irritable bowel syndrome)   . Idiopathic hypersomnia   . 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 CRYOTHERAPY    . SHOULDER SURGERY      right  . TONSILLECTOMY      Social History  Substance Use Topics  . Smoking status: Never Smoker  . Smokeless tobacco: Never Used  . Alcohol use No    Family History  Problem Relation Age of Onset  . Adopted: Yes  . Cancer Mother        stomach  . Heart disease Mother   . Heart attack Mother   . Heart disease Father   . Colon cancer Neg Hx     Allergies  Allergen Reactions  . Keflex [Cephalexin] Itching    Medication list has been reviewed and updated.  Current Outpatient Prescriptions on File Prior to Visit  Medication Sig Dispense Refill  . Armodafinil (NUVIGIL) 250 MG tablet Take 1 tablet (250 mg total) by mouth daily. 90 tablet 1  . estradiol (ESTRACE) 0.5 MG tablet Take 1 tablet (0.5 mg total) by mouth daily. 90 tablet 3  . Ibuprofen (ADVIL PO) Take by mouth as needed. Reported on 06/27/2015    . Melatonin 3 MG TABS Take by mouth. Reported on 06/27/2015    . progesterone (PROMETRIUM) 100 MG capsule Take 1 capsule (100 mg total) by mouth daily. 90 capsule 3   No current facility-administered medications on file prior to visit.     Review of Systems:  As per HPI- otherwise negative. No concerns about CP or SOB   Physical Examination:  Vitals:   10/15/16 1443  BP: 109/65  Pulse: 83  Temp: 98.7 F (37.1 C)   Vitals:   10/15/16 1443  Weight: 125 lb (56.7 kg)  Height: 5' 3.5" (1.613 m)   Body mass index is 21.8 kg/m. Ideal Body Weight: Weight in (lb) to have BMI = 25: 143.1  GEN: WDWN, NAD, Non-toxic, A & O x 3, slender build, looks well HEENT: Atraumatic, Normocephalic. Neck supple. No masses, No LAD.  Bilateral TM wnl, oropharynx normal.  PEERL,EOMI.   Ears and Nose: No external deformity. CV: RRR, No M/G/R. No JVD. No thrill. No extra heart sounds. PULM: CTA B, no wheezes, crackles, rhonchi. No retractions. No resp. distress. No accessory muscle use. ABD: S, NT, ND, +BS. No rebound. No HSM. EXTR: No c/c/e NEURO Normal gait.  PSYCH: Normally  interactive. Conversant. Not depressed or anxious appearing.  Calm demeanor.    Assessment and Plan: Physical exam  Screening for deficiency anemia - Plan: CBC  Screening for diabetes mellitus - Plan: Comprehensive metabolic panel, Hemoglobin A1c  Screening for thyroid disorder - Plan: TSH  Encounter for hepatitis C screening test for low risk patient - Plan: Hepatitis C antibody  Screening for hyperlipidemia - Plan: Lipid panel  Dysthymia - Plan: escitalopram (LEXAPRO) 10 MG tablet  Here today for a CPE Labs pending as above Will have her start on lexapro 10 mg- she will let me know how this works for her, and we may decide to increase to 20 Will plan further follow- up pending labs.  Signed Abbe AmsterdamJessica Slate Debroux, MD Results for orders placed or performed in visit on 10/15/16  CBC  Result Value Ref Range   WBC 8.2 4.0 - 10.5 K/uL   RBC 4.42 3.87 - 5.11 Mil/uL   Platelets 183.0 150.0 - 400.0 K/uL   Hemoglobin 14.2 12.0 - 15.0 g/dL   HCT 16.142.4 09.636.0 - 04.546.0 %   MCV 95.8 78.0 - 100.0 fl   MCHC 33.4 30.0 - 36.0 g/dL   RDW 40.912.4 81.111.5 - 91.415.5 %  Comprehensive metabolic panel  Result Value Ref Range   Sodium 137 135 - 145 mEq/L   Potassium 3.9 3.5 - 5.1 mEq/L   Chloride 100 96 - 112 mEq/L   CO2 33 (H) 19 - 32 mEq/L   Glucose, Bld 73 70 - 99 mg/dL   BUN 18 6 - 23 mg/dL   Creatinine, Ser 7.821.04 0.40 - 1.20 mg/dL   Total Bilirubin 0.6 0.2 - 1.2 mg/dL   Alkaline Phosphatase 81 39 - 117 U/L   AST 16 0 - 37 U/L   ALT 13 0 - 35 U/L   Total Protein 6.3 6.0 - 8.3 g/dL   Albumin 4.2 3.5 - 5.2 g/dL   Calcium 9.6 8.4 - 95.610.5 mg/dL   GFR 21.3058.19 (L) >86.57>60.00 mL/min  Hepatitis C antibody  Result Value Ref Range   HCV Ab NEGATIVE NEGATIVE  Lipid panel  Result Value Ref Range   Cholesterol 174 0 - 200 mg/dL   Triglycerides 846.9158.0 (H) 0.0 - 149.0 mg/dL   HDL 62.9565.20 >28.41>39.00 mg/dL   VLDL 32.431.6 0.0 - 40.140.0 mg/dL   LDL Cholesterol 78 0 - 99 mg/dL   Total CHOL/HDL Ratio 3    NonHDL 109.14   TSH   Result Value Ref Range   TSH 1.81 0.35 - 4.50 uIU/mL  Hemoglobin A1c  Result Value Ref Range   Hgb A1c MFr Bld 5.5 4.6 - 6.5 %   Received her labs- mychart  message to pt

## 2016-10-16 ENCOUNTER — Encounter: Payer: Self-pay | Admitting: Family Medicine

## 2016-10-16 LAB — TSH: TSH: 1.81 u[IU]/mL (ref 0.35–4.50)

## 2016-10-16 LAB — COMPREHENSIVE METABOLIC PANEL
ALK PHOS: 81 U/L (ref 39–117)
ALT: 13 U/L (ref 0–35)
AST: 16 U/L (ref 0–37)
Albumin: 4.2 g/dL (ref 3.5–5.2)
BILIRUBIN TOTAL: 0.6 mg/dL (ref 0.2–1.2)
BUN: 18 mg/dL (ref 6–23)
CO2: 33 meq/L — AB (ref 19–32)
Calcium: 9.6 mg/dL (ref 8.4–10.5)
Chloride: 100 mEq/L (ref 96–112)
Creatinine, Ser: 1.04 mg/dL (ref 0.40–1.20)
GFR: 58.19 mL/min — AB (ref >60.00)
GLUCOSE: 73 mg/dL (ref 70–99)
POTASSIUM: 3.9 meq/L (ref 3.5–5.1)
Sodium: 137 mEq/L (ref 135–145)
TOTAL PROTEIN: 6.3 g/dL (ref 6.0–8.3)

## 2016-10-16 LAB — LIPID PANEL
CHOLESTEROL: 174 mg/dL (ref 0–200)
HDL: 65.2 mg/dL (ref >39.00)
LDL CALC: 78 mg/dL (ref 0–99)
NonHDL: 109.14
TRIGLYCERIDES: 158 mg/dL — AB (ref 0.0–149.0)
Total CHOL/HDL Ratio: 3
VLDL: 31.6 mg/dL (ref 0.0–40.0)

## 2016-10-16 LAB — CBC
HEMATOCRIT: 42.4 % (ref 36.0–46.0)
HEMOGLOBIN: 14.2 g/dL (ref 12.0–15.0)
MCHC: 33.4 g/dL (ref 30.0–36.0)
MCV: 95.8 fl (ref 78.0–100.0)
PLATELETS: 183 10*3/uL (ref 150.0–400.0)
RBC: 4.42 Mil/uL (ref 3.87–5.11)
RDW: 12.4 % (ref 11.5–15.5)
WBC: 8.2 10*3/uL (ref 4.0–10.5)

## 2016-10-16 LAB — HEPATITIS C ANTIBODY: HCV Ab: NEGATIVE

## 2016-10-16 LAB — HEMOGLOBIN A1C: HEMOGLOBIN A1C: 5.5 % (ref 4.6–6.5)

## 2016-10-27 ENCOUNTER — Other Ambulatory Visit: Payer: Self-pay | Admitting: Certified Nurse Midwife

## 2016-10-27 ENCOUNTER — Other Ambulatory Visit: Payer: Self-pay

## 2016-10-27 DIAGNOSIS — N951 Menopausal and female climacteric states: Secondary | ICD-10-CM

## 2016-10-27 DIAGNOSIS — Z1231 Encounter for screening mammogram for malignant neoplasm of breast: Secondary | ICD-10-CM

## 2016-10-27 NOTE — Telephone Encounter (Signed)
For DL to review. 

## 2016-10-27 NOTE — Telephone Encounter (Signed)
Medication refill request: Estradiol 0.5mg  Last AEX:  09/25/16 DL Next AEX: 1/6/105/8/19 Last MMG (if hormonal medication request): 10/04/15 BIRADS 1 negative/density c -- scheduled 11/11/16 Refill authorized: 10/17/15 #90 w/3 refills; today please advise, DL out of office 9/6/046/4/18

## 2016-10-30 MED ORDER — ESTRADIOL 0.5 MG PO TABS: 0.5000 mg | ORAL_TABLET | Freq: Every day | ORAL | 0 refills | Status: DC

## 2016-10-30 NOTE — Telephone Encounter (Signed)
MMG scheduled for 11/11/16

## 2016-10-30 NOTE — Telephone Encounter (Signed)
She was to have mammogram so I can refill. Needs phone call to check to see if done.

## 2016-10-30 NOTE — Telephone Encounter (Signed)
Will refill one month only until results in

## 2016-11-11 ENCOUNTER — Ambulatory Visit
Admission: RE | Admit: 2016-11-11 | Discharge: 2016-11-11 | Disposition: A | Discharge status: Home / Self Care | Payer: Commercial Managed Care - PPO | Source: Ambulatory Visit | Attending: Certified Nurse Midwife | Admitting: Certified Nurse Midwife

## 2016-11-11 DIAGNOSIS — Z1231 Encounter for screening mammogram for malignant neoplasm of breast: Secondary | ICD-10-CM

## 2016-11-12 ENCOUNTER — Other Ambulatory Visit: Payer: Self-pay | Admitting: *Deleted

## 2016-11-12 ENCOUNTER — Other Ambulatory Visit: Payer: Self-pay | Admitting: Certified Nurse Midwife

## 2016-11-12 DIAGNOSIS — N951 Menopausal and female climacteric states: Secondary | ICD-10-CM

## 2016-11-12 DIAGNOSIS — R928 Other abnormal and inconclusive findings on diagnostic imaging of breast: Secondary | ICD-10-CM

## 2016-11-12 NOTE — Telephone Encounter (Signed)
Medication refill request: Estradiol  Last AEX:  09-25-16  Next AEX: 09-30-17  Last MMG (if hormonal medication request): 11-11-16- needs additional imaging  Refill authorized: please advise   Sending to PG since DL is out of the office

## 2016-11-13 ENCOUNTER — Other Ambulatory Visit: Payer: Self-pay

## 2016-11-13 DIAGNOSIS — R928 Other abnormal and inconclusive findings on diagnostic imaging of breast: Secondary | ICD-10-CM

## 2016-11-14 ENCOUNTER — Ambulatory Visit
Admission: RE | Admit: 2016-11-14 | Discharge: 2016-11-14 | Disposition: A | Discharge status: Home / Self Care | Payer: Commercial Managed Care - PPO | Source: Ambulatory Visit | Attending: Certified Nurse Midwife | Admitting: Certified Nurse Midwife

## 2016-11-14 DIAGNOSIS — R928 Other abnormal and inconclusive findings on diagnostic imaging of breast: Secondary | ICD-10-CM

## 2016-11-17 ENCOUNTER — Other Ambulatory Visit: Payer: Self-pay | Admitting: Certified Nurse Midwife

## 2016-11-17 DIAGNOSIS — N951 Menopausal and female climacteric states: Secondary | ICD-10-CM

## 2016-11-19 MED ORDER — ESTRADIOL 0.5 MG PO TABS: 0.5000 mg | ORAL_TABLET | Freq: Every day | ORAL | 3 refills | Status: DC

## 2016-12-02 ENCOUNTER — Encounter: Payer: Self-pay | Admitting: Certified Nurse Midwife

## 2016-12-02 ENCOUNTER — Other Ambulatory Visit: Payer: Self-pay | Admitting: Certified Nurse Midwife

## 2016-12-02 DIAGNOSIS — N951 Menopausal and female climacteric states: Secondary | ICD-10-CM

## 2016-12-03 NOTE — Telephone Encounter (Signed)
eScribe request from [facility] for refill on PROGESTERONE Last filled - 10/17/15, #90 X 3 RF Last AEX - 09/25/16 Next AEX - 09/30/17 Last MMG - 11/11/16 with ultrasound: Bi-Rads 2:  Benign, screening in one year  Please advise refill.  Estradiol was refilled on 11/19/16.

## 2016-12-04 ENCOUNTER — Telehealth: Payer: Self-pay | Admitting: Internal Medicine

## 2016-12-04 ENCOUNTER — Encounter: Payer: Self-pay | Admitting: Family

## 2016-12-04 ENCOUNTER — Ambulatory Visit (INDEPENDENT_AMBULATORY_CARE_PROVIDER_SITE_OTHER): Payer: Self-pay | Admitting: Family

## 2016-12-04 VITALS — BP 106/75 | HR 79 | Temp 98.1°F | Resp 16 | Ht 63.5 in | Wt 123.0 lb

## 2016-12-04 DIAGNOSIS — F418 Other specified anxiety disorders: Secondary | ICD-10-CM

## 2016-12-04 MED ORDER — FLUOXETINE HCL 20 MG PO TABS: ORAL_TABLET | ORAL | 0 refills | Status: DC

## 2016-12-04 NOTE — Patient Instructions (Addendum)
Please begin Prozac 20mg  one half tablet by mouth once daily for 3 days then increase to a full tablet once daily. If you have thoughts of hurting herself or others please call 911. In the meantime if you have new/worsening symptoms please let us know. Please print a coupon on [email or web address] to [facility]. Please contact psychiatry to arrange an appointment.  Psychiatric Services:  [facility], [address], [place], [place]   [phone] [facility] [phone] [facility], [address] [place], [place] [phone] [facility], [address], [place], [place] [phone]

## 2016-12-04 NOTE — Progress Notes (Signed)
Subjective:    Patient ID: Miranda [name], female    DOB: 1960-10-05, 56 y.o.   MRN: [id number]  HPI  Miranda [name] is a 56 yr old female who presents today to discuss depression. Her partner accompanies her here today and provides the majority of the history at pt's request.  Partner  notes that over the past month the patient has had insomnia.  In the last 2 days she has has been feeling emotionally overwhelmed. [place]. Feels like she is going to have a panic attack. Last night she opened up to her partner about thoughts of SI. She declined ED due to lack of insurance. She expects to have insurance at the end of the month. Has feeling of dread, hopelessness.  She has been off of her progesterone x 1 week. No plan just feels overwhelmed/tired/hopeless.  Reports that she slept 5 hours last night.  She denies hx BPD.  Notes that she is adopted but her older sister had history of suicide and they believe that she had schizophrenia. She has a younger sister with some mental health disease.  Brother died from drug overdose.  Not sure if it was suicide.  Reports some occasional auditory hallucination and occasional visual hallucinations.  None recently. No manic episodes.  No previous hx of hospitalization for depression. She had previously been treated with Lexapro but she discontinued it due to sleepiness on this medication.  Review of Systems See HPI  Past Medical History:  Diagnosis Date  . Abnormal Pap smear of cervix 1996  . Allergy   . Allergy, unspecified not elsewhere classified   . IBS (irritable bowel syndrome)   . Idiopathic hypersomnia   . 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 Marital status: Single    Spouse name: N/A  . Number of children: N/A  . Years of education: N/A   Occupational History  . Not on file.   Social History Main Topics  . Smoking status: Never Smoker  . Smokeless tobacco: Never Used  . Alcohol use  No  . Drug use: No  . Sexual activity: Yes    Partners: Female    Birth control/ protection: None, Post-menopausal   Other Topics Concern  . Not on file   Social History Narrative  . No narrative on file    Past Surgical History:  Procedure Laterality Date  . CRYOTHERAPY    . SHOULDER SURGERY     right  . TONSILLECTOMY      Family History  Problem Relation Age of Onset  . Adopted: Yes  . Cancer Mother        stomach  . Heart disease Mother   . Heart attack Mother   . Heart disease Father   . Colon cancer Neg Hx     Allergies  Allergen Reactions  . Keflex [Cephalexin] Itching  . Lexapro [Escitalopram] Other (See Comments)    LETHARGY    Current Outpatient Prescriptions on File Prior to Visit  Medication Sig Dispense Refill  . estradiol (ESTRACE) 0.5 MG tablet Take 1 tablet (0.5 mg total) by mouth daily. 30 tablet 3  . Melatonin 3 MG TABS Take 5 mg by mouth at bedtime. Reported on 06/27/2015    . Armodafinil (NUVIGIL) 250 MG tablet Take 1 tablet (250 mg total) by mouth daily. (Patient not taking: Reported on 12/04/2016) 90 tablet 1  . Ibuprofen (ADVIL PO) Take by mouth  as needed. Reported on 06/27/2015    . progesterone (PROMETRIUM) 100 MG capsule take 1 capsule by mouth once daily (Patient not taking: Reported on 12/04/2016) 90 capsule 3   No current facility-administered medications on file prior to visit.     BP 106/75 (BP Location: Right Arm, Cuff Size: Normal)   Pulse 79   Temp 98.1 F (36.7 C) (Oral)   Resp 16   Ht 5' 3.5" (1.613 m)   Wt 123 lb (55.8 kg)   LMP 09/06/2012   SpO2 99%   BMI 21.45 kg/m       Objective:   Physical Exam  Constitutional: She is oriented to person, place, and time. She appears well-developed and well-nourished. No distress.  Neurological: She is alert and oriented to person, place, and time.  Psychiatric: Judgment and thought content normal. She is withdrawn. Cognition and memory are normal. She exhibits a depressed mood.    Flat affect, tearful          Assessment & Plan:  Depression/anxiety- uncontrolled. I reviewed the case with Dr. Patsy Lager her primary care provider. She had some sleepiness on Lexapro in the past. She is currently uninsured. We discussed that if she does have active thoughts of suicide with plan that she is to call 911. She agrees to do this plan. Her partner has secured the family gun and reports that it is unavailable to the patient at this time. She will be obtaining private insurance in 2 weeks. I have given her numbers to local psychiatrists that she can call and set up an appointment.bes active. In the meantime written hr out of work today and tomorrow. She is advised to follow-up with Dr. Patsy Lager in 2 weeks for re-evaluation. She understands to call us if she has any new or worsening symptoms such as worsening depression, visual or auditory hallucinations, or manic activity.

## 2016-12-04 NOTE — Telephone Encounter (Signed)
Spoke with pt's partner, WhitefieldMarshand. She is requesting samples of Nuvigil. Advised her that we do not have samples at this time. Nothing further was needed.

## 2016-12-08 ENCOUNTER — Ambulatory Visit: Payer: Commercial Managed Care - PPO | Admitting: Family Medicine

## 2016-12-11 ENCOUNTER — Encounter: Payer: Self-pay | Admitting: Family Medicine

## 2016-12-11 MED ORDER — ESCITALOPRAM OXALATE 10 MG PO TABS: 10.0000 mg | ORAL_TABLET | Freq: Every day | ORAL | 4 refills | Status: DC

## 2016-12-11 NOTE — Addendum Note (Signed)
Addended by: Abbe AmsterdamOPLAND, JESSICA C on: 12/11/2016 06:57 PM   Modules accepted: Orders

## 2016-12-23 ENCOUNTER — Other Ambulatory Visit: Payer: Self-pay | Admitting: Internal Medicine

## 2016-12-24 ENCOUNTER — Encounter: Payer: Self-pay | Admitting: Family Medicine

## 2016-12-24 ENCOUNTER — Other Ambulatory Visit: Payer: Self-pay

## 2016-12-24 ENCOUNTER — Encounter: Payer: Self-pay | Admitting: Internal Medicine

## 2016-12-24 DIAGNOSIS — F5101 Primary insomnia: Secondary | ICD-10-CM

## 2016-12-24 MED ORDER — ARMODAFINIL 250 MG PO TABS: 250.0000 mg | ORAL_TABLET | Freq: Every day | ORAL | 1 refills | Status: DC

## 2016-12-24 NOTE — Telephone Encounter (Signed)
CY Please advise on refill. Thanks.  

## 2016-12-24 NOTE — Telephone Encounter (Signed)
Pt requesting a refill on Nuvigil 250mg .   Last refill: 06/06/2016 #90 with 1 refill- take 1 tab po qd.   [facility] [address].  Last ov: 03/24/16 Next ov: none, recall for 02/2017.    CY please advise on refill.  Thanks.

## 2016-12-24 NOTE — Telephone Encounter (Signed)
Ok to refill as before 

## 2016-12-29 MED ORDER — TRAZODONE HCL 50 MG PO TABS: 25.0000 mg | ORAL_TABLET | Freq: Every evening | ORAL | 6 refills | Status: DC | PRN

## 2016-12-29 NOTE — Addendum Note (Signed)
Addended by: Abbe AmsterdamOPLAND, JESSICA C on: 12/29/2016 05:50 PM   Modules accepted: Orders

## 2017-03-19 ENCOUNTER — Encounter: Payer: Self-pay | Admitting: Internal Medicine

## 2017-03-19 ENCOUNTER — Encounter: Payer: Self-pay | Admitting: Family Medicine

## 2017-03-19 MED ORDER — ESCITALOPRAM OXALATE 20 MG PO TABS: 20.0000 mg | ORAL_TABLET | Freq: Every day | ORAL | 1 refills | Status: DC

## 2017-03-25 ENCOUNTER — Ambulatory Visit (INDEPENDENT_AMBULATORY_CARE_PROVIDER_SITE_OTHER): Payer: Commercial Managed Care - PPO | Admitting: Internal Medicine

## 2017-03-25 ENCOUNTER — Encounter: Payer: Self-pay | Admitting: Internal Medicine

## 2017-03-25 DIAGNOSIS — G4711 Idiopathic hypersomnia with long sleep time: Secondary | ICD-10-CM | POA: Diagnosis not present

## 2017-03-25 DIAGNOSIS — G4700 Insomnia, unspecified: Secondary | ICD-10-CM | POA: Diagnosis not present

## 2017-03-25 DIAGNOSIS — Z23 Encounter for immunization: Secondary | ICD-10-CM | POA: Diagnosis not present

## 2017-03-25 MED ORDER — ARMODAFINIL 250 MG PO TABS: ORAL_TABLET | ORAL | 1 refills | Status: DC

## 2017-03-25 NOTE — Patient Instructions (Signed)
Ok to try taking an extra 1/2 Nuvigil tab at mid-day as needed.  Flu vax- standard  Please call as needed

## 2017-03-25 NOTE — Progress Notes (Signed)
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 NPSG 12/19/01- AHI 0, no snoring, 24% REM, 88.7% sleep efficiency MSLT- 07/05/03- nonspecific hypersomnia, mean latency 3.5 minutes, No SOREM  -----------------------------------------------------------------------------------------------  03/24/2016-56 year old female never smoker followed for Idiopathic Hypersomnia with insomnia complicated by seronegative RA FOLLOWS FOR: Pt states she feels about the same as last time with her sleepiness during the day; unable to take naps. Gets up aroung 4:30am and goes to bed around 9:00pm. Sonata 5 mg/melatonin 3 mg, Nuvigil 250 mg once daily She reports some auditory hallucinations over the past 8 months, once or twice per week at most. Questions if Nuvigil might be causing this. We will remembered remote trial of Adderall. A sister is schizophrenic.  03/25/17- 56 year old female never smoker followed for Idiopathic Hypersomnia with insomnia complicated by seronegative RA Hypersomnia-pt would like to know if she could take 1/2 tablet of 250mg  Nuvigil in the afternoon as well as staying at 1 tablet in every morning.  melatonin 3 mg, Nuvigil 250 mg once daily  Lexapro helps sleep so no longer needs sonata. Nuvigil taken around 6:30 in the morning is working as she goes to the gym in the morning. It has worn off by one or 2 in the afternoon. Long time ago she recalls Adderall causing uncomfortable pounding heartbeat.  ROS-see HPI   + = positive Constitutional:    weight loss, night sweats, fevers, chills, + fatigue, lassitude. HEENT:    headaches, difficulty swallowing, tooth/dental problems, sore throat,       sneezing, itching, ear ache, nasal congestion, post nasal drip, snoring CV:    chest pain, orthopnea, PND, swelling in lower extremities, anasarca,                                                    dizziness, palpitations Resp:   shortness of  breath with exertion or at rest.                productive cough,   non-productive cough, coughing up of blood.              change in color of mucus.  wheezing.   Skin:    rash or lesions. GI:  No-   heartburn, indigestion, abdominal pain, nausea, vomiting,e GU: . MS:   joint pain, stiffness, . Neuro-    + auditory hallucination Psych:  change in mood or affect.  depression or anxiety.   memory loss.  OBJ- Physical Exam General- Alert, Oriented, Affect-appropriate, Distress- none acute, + trim Skin- rash-none, lesions- none, excoriation- none Lymphadenopathy- none Head- atraumatic            Eyes- Gross vision intact, PERRLA, conjunctivae and secretions clear            Ears- Hearing, canals-normal            Nose- Clear, no-Septal dev, mucus, polyps, erosion, perforation             Throat- Mallampati II , mucosa clear , drainage- none, tonsils- atrophic Neck- flexible , trachea midline, no stridor , thyroid nl, carotid no bruit Chest - symmetrical excursion , unlabored           Heart/CV- RRR , no murmur , no gallop  , no rub, nl s1 s2                           -  JVD- none , edema- none, stasis changes- none, varices- none           Lung- clear to P&A, wheeze- none, cough- none , dullness-none, rub- none           Chest wall-  Abd-  Br/ Gen/ Rectal- Not done, not indicated Extrem- cyanosis- none, clubbing, none, atrophy- none, strength- nl Neuro- grossly intact to observation     

## 2017-03-25 NOTE — Assessment & Plan Note (Signed)
Her insomnia pattern is much improved now since she is been on Lexapro. I find that interesting, but it works for her.

## 2017-03-25 NOTE — Assessment & Plan Note (Signed)
I told her her insurance might have a quantity limit, but we can try letting her take Nuvigil 250 mg in the morning, 125 mg in the afternoon. She gets a three-month supplies so that would be 135 tablets for 3 months. I considered giving her short acting Adderall to take as needed in the afternoon but an unknown dose of that had caused pounding heartbeat in the past.

## 2017-04-05 ENCOUNTER — Other Ambulatory Visit: Payer: Self-pay | Admitting: Certified Nurse Midwife

## 2017-04-05 DIAGNOSIS — N951 Menopausal and female climacteric states: Secondary | ICD-10-CM

## 2017-04-06 ENCOUNTER — Encounter: Payer: Self-pay | Admitting: Certified Nurse Midwife

## 2017-04-06 NOTE — Telephone Encounter (Signed)
Medication refill request: estradiol tablet  Last AEX:  09/25/16 DL  Next AEX: 1/6/105/8/19  Last MMG (if hormonal medication request): 11/14/16 left breast US: BIRADS 2 benign  Refill authorized: 11/19/16 #30, 3 RF today, please advise

## 2017-07-14 ENCOUNTER — Encounter: Payer: Self-pay | Admitting: Internal Medicine

## 2017-07-14 ENCOUNTER — Telehealth: Payer: Self-pay

## 2017-07-14 NOTE — Telephone Encounter (Signed)
PA for Nuvigil was done through [facility].   Your request has been approved CaseId:[id number];Status:Approved;Review Type:Prior Auth;Coverage Start Date:06/14/2017;Coverage End Date:07/14/2018;  Send to Plan  Pt advised through email. Nothing further is needed.

## 2017-09-16 ENCOUNTER — Other Ambulatory Visit: Payer: Self-pay | Admitting: Certified Nurse Midwife

## 2017-09-16 DIAGNOSIS — Z1231 Encounter for screening mammogram for malignant neoplasm of breast: Secondary | ICD-10-CM

## 2017-09-22 ENCOUNTER — Encounter: Payer: Self-pay | Admitting: Internal Medicine

## 2017-09-22 ENCOUNTER — Ambulatory Visit: Payer: Commercial Managed Care - PPO | Admitting: Internal Medicine

## 2017-09-22 VITALS — BP 116/68 | HR 56 | Ht 64.0 in | Wt 134.2 lb

## 2017-09-22 DIAGNOSIS — G4711 Idiopathic hypersomnia with long sleep time: Secondary | ICD-10-CM | POA: Diagnosis not present

## 2017-09-22 DIAGNOSIS — G4700 Insomnia, unspecified: Secondary | ICD-10-CM

## 2017-09-22 MED ORDER — AMPHETAMINE-DEXTROAMPHETAMINE 10 MG PO TABS: ORAL_TABLET | ORAL | 0 refills | Status: DC

## 2017-09-22 MED ORDER — ARMODAFINIL 250 MG PO TABS: ORAL_TABLET | ORAL | 1 refills | Status: DC

## 2017-09-22 NOTE — Patient Instructions (Signed)
Script refilling Nuvigil  Script for adderall 10 mg tabs for occasional use if extra help is needed

## 2017-09-22 NOTE — Progress Notes (Signed)
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 NPSG 12/19/01- AHI 0, no snoring, 24% REM, 88.7% sleep efficiency MSLT- 07/05/03- nonspecific hypersomnia, mean latency 3.5 minutes, No SOREM  -----------------------------------------------------------------------------------------------  03/25/17- 57 year old female never smoker followed for Idiopathic Hypersomnia with insomnia complicated by seronegative RA Hypersomnia-pt would like to know if she could take 1/2 tablet of  Nuvigil in the afternoon as well as staying at 1 tablet in every morning.  melatonin 3 mg, Nuvigil 250 mg once daily  Lexapro helps sleep so no longer needs sonata. Nuvigil taken around 6:30 in the morning is working as she goes to the gym in the morning. It has worn off by one or 2 in the afternoon. Long time ago she recalls Adderall causing uncomfortable pounding heartbeat.  09/22/2017- 57 year old female never smoker followed for Idiopathic Hypersomnia with insomnia complicated by seronegative RA melatonin 3 mg, Nuvigil 250 mg once daily,  Lexapro, melatonin ----6 month follow for medications for hypersomnia.  Still occasionally very tired during the day.  Think she gets enough sleep, but sleeps better if she takes melatonin.  No other sedating medicines.  Does take an occasional nap. We discussed trying to add Adderall 10 mg for occasional extra use if needed.  ROS-see HPI   + = positive Constitutional:    weight loss, night sweats, fevers, chills, + fatigue, lassitude. HEENT:    headaches, difficulty swallowing, tooth/dental problems, sore throat,       sneezing, itching, ear ache, nasal congestion, post nasal drip, snoring CV:    chest pain, orthopnea, PND, swelling in lower extremities, anasarca,                                                    dizziness, palpitations Resp:   shortness of breath with exertion or at rest.                productive cough,    non-productive cough, coughing up of blood.              change in color of mucus.  wheezing.   Skin:    rash or lesions. GI:  No-   heartburn, indigestion, abdominal pain, nausea, vomiting,e GU: . MS:   joint pain, stiffness, . Neuro-    + auditory hallucination Psych:  change in mood or affect.  depression or anxiety.   memory loss.  OBJ- Physical Exam General- Alert, Oriented, Affect-appropriate, Distress- none acute, + trim Skin- rash-none, lesions- none, excoriation- none Lymphadenopathy- none Head- atraumatic            Eyes- Gross vision intact, PERRLA, conjunctivae and secretions clear            Ears- Hearing, canals-normal            Nose- Clear, no-Septal dev, mucus, polyps, erosion, perforation             Throat- Mallampati II , mucosa clear , drainage- none, tonsils- atrophic Neck- flexible , trachea midline, no stridor , thyroid nl, carotid no bruit Chest - symmetrical excursion , unlabored           Heart/CV- RRR , no murmur , no gallop  , no rub, nl s1 s2                           -  JVD- none , edema- none, stasis changes- none, varices- none           Lung- clear to P&A, wheeze- none, cough- none , dullness-none, rub- none           Chest wall-  Abd-  Br/ Gen/ Rectal- Not done, not indicated Extrem- cyanosis- none, clubbing, none, atrophy- none, strength- nl Neuro- grossly intact to observation     

## 2017-09-22 NOTE — Assessment & Plan Note (Signed)
She thinks she might have gotten somewhat worse over the recent years-nothing dramatic.  Still no cataplexy or sleep paralysis.  She thinks she gets enough sleep but does take an occasional nap. Plan-she will continue Nuvigil but we are going to let her try adding Adderall 10 mg occasionally if needed with careful discussion.

## 2017-09-22 NOTE — Assessment & Plan Note (Signed)
She is just using melatonin now but it does seem to make a difference on the nights when she uses it.

## 2017-09-30 ENCOUNTER — Ambulatory Visit (INDEPENDENT_AMBULATORY_CARE_PROVIDER_SITE_OTHER): Payer: Commercial Managed Care - PPO | Admitting: Certified Nurse Midwife

## 2017-09-30 ENCOUNTER — Other Ambulatory Visit: Payer: Self-pay

## 2017-09-30 ENCOUNTER — Encounter: Payer: Self-pay | Admitting: Certified Nurse Midwife

## 2017-09-30 VITALS — BP 104/62 | HR 68 | Resp 16 | Ht 63.25 in | Wt 135.0 lb

## 2017-09-30 DIAGNOSIS — Z01419 Encounter for gynecological examination (general) (routine) without abnormal findings: Secondary | ICD-10-CM | POA: Diagnosis not present

## 2017-09-30 DIAGNOSIS — Z79890 Hormone replacement therapy (postmenopausal): Secondary | ICD-10-CM | POA: Diagnosis not present

## 2017-09-30 DIAGNOSIS — N951 Menopausal and female climacteric states: Secondary | ICD-10-CM | POA: Diagnosis not present

## 2017-09-30 MED ORDER — PROGESTERONE MICRONIZED 100 MG PO CAPS: 100.0000 mg | ORAL_CAPSULE | Freq: Every day | ORAL | 12 refills | Status: DC

## 2017-09-30 MED ORDER — ESTRADIOL 0.5 MG PO TABS: 0.5000 mg | ORAL_TABLET | Freq: Every day | ORAL | 12 refills | Status: DC

## 2017-09-30 NOTE — Patient Instructions (Signed)
EXERCISE AND DIET:  We recommended that you start or continue a regular exercise program for good health. Regular exercise means any activity that makes your heart beat faster and makes you sweat.  We recommend exercising at least 30 minutes per day at least 3 days a week, preferably 4 or 5.  We also recommend a diet low in fat and sugar.  Inactivity, poor dietary choices and obesity can cause diabetes, heart attack, stroke, and kidney damage, among others.    ALCOHOL AND SMOKING:  Women should limit their alcohol intake to no more than 7 drinks/beers/glasses of wine (combined, not each!) per week. Moderation of alcohol intake to this level decreases your risk of breast cancer and liver damage. And of course, no recreational drugs are part of a healthy lifestyle.  And absolutely no smoking or even second hand smoke. Most people know smoking can cause heart and lung diseases, but did you know it also contributes to weakening of your bones? Aging of your skin?  Yellowing of your teeth and nails?  CALCIUM AND VITAMIN D:  Adequate intake of calcium and Vitamin D are recommended.  The recommendations for exact amounts of these supplements seem to change often, but generally speaking 600 mg of calcium (either carbonate or citrate) and 800 units of Vitamin D per day seems prudent. Certain women may benefit from higher intake of Vitamin D.  If you are among these women, your doctor will have told you during your visit.    PAP SMEARS:  Pap smears, to check for cervical cancer or precancers,  have traditionally been done yearly, although recent scientific advances have shown that most women can have pap smears less often.  However, every woman still should have a physical exam from her gynecologist every year. It will include a breast check, inspection of the vulva and vagina to check for abnormal growths or skin changes, a visual exam of the cervix, and then an exam to evaluate the size and shape of the uterus and  ovaries.  And after 57 years of age, a rectal exam is indicated to check for rectal cancers. We will also provide age appropriate advice regarding health maintenance, like when you should have certain vaccines, screening for sexually transmitted diseases, bone density testing, colonoscopy, mammograms, etc.   MAMMOGRAMS:  All women over 40 years old should have a yearly mammogram. Many facilities now offer a "3D" mammogram, which may cost around $50 extra out of pocket. If possible,  we recommend you accept the option to have the 3D mammogram performed.  It both reduces the number of women who will be called back for extra views which then turn out to be normal, and it is better than the routine mammogram at detecting truly abnormal areas.    COLONOSCOPY:  Colonoscopy to screen for colon cancer is recommended for all women at age 50.  We know, you hate the idea of the prep.  We agree, BUT, having colon cancer and not knowing it is worse!!  Colon cancer so often starts as a polyp that can be seen and removed at colonscopy, which can quite literally save your life!  And if your first colonoscopy is normal and you have no family history of colon cancer, most women don't have to have it again for 10 years.  Once every ten years, you can do something that may end up saving your life, right?  We will be happy to help you get it scheduled when you are ready.    Be sure to check your insurance coverage so you understand how much it will cost.  It may be covered as a preventative service at no cost, but you should check your particular policy.      Vitamin D Deficiency Vitamin D deficiency is when your body does not have enough vitamin D. Vitamin D is important because:  It helps your body use other minerals that your body needs.  It helps keep your bones strong and healthy.  It may help to prevent some diseases.  It helps your heart and other muscles work well.  You can get vitamin D by:  Eating foods with  vitamin D in them.  Drinking or eating milk or other foods that have had vitamin D added to them.  Taking a vitamin D supplement.  Being in the sun.  Not getting enough vitamin D can make your bones become soft. It can also cause other health problems. Follow these instructions at home:  Take medicines and supplements only as told by your doctor.  Eat foods that have vitamin D. These include: ? Dairy products, cereals, or juices with added vitamin D. Check the label for vitamin D. ? Fatty fish like salmon or trout. ? Eggs. ? Oysters.  Do not use tanning beds.  Stay at a healthy weight. Lose weight, if needed.  Keep all follow-up visits as told by your doctor. This is important. Contact a doctor if:  Your symptoms do not go away.  You feel sick to your stomach (nauseous).  Youthrow up (vomit).  You poop less often than usual or you have trouble pooping (constipation). This information is not intended to replace advice given to you by your health care provider. Make sure you discuss any questions you have with your health care provider. Document Released: 05/01/2011 Document Revised: 10/18/2015 Document Reviewed: 09/27/2014 Elsevier Interactive Patient Education  2018 Elsevier Inc.  

## 2017-09-30 NOTE — Progress Notes (Signed)
57 y.o. G0P0000 Single  Caucasian Fe here for annual exam. Menopausal on HRT,with minimal symptoms now. Desires continuation of HRT, working well. Denies vaginal bleeding or vaginal dryness. Has noted 13 pound weight gain in the past year. Has slowed down with exercise due to joint pain. Aware she needs to watch diet also. Sees PCP for labs, aex, Lexapro and Adderall management. No other health concerns today.   Patient's last menstrual period was 09/06/2012.          Sexually active: Yes.    The current method of family planning is post menopausal status.    Exercising: Yes.    weight lifting Smoker:  no  Health Maintenance: Pap:  09-20-15 neg no endos, 09-25-16 neg HPV HR neg History of Abnormal Pap: yes MMG:  6/18 bilateral, left breast u/s category c density birads 2:neg Self Breast exams: yes Colonoscopy:  2016 neg f/u 15yrs BMD:   none TDaP:  2010 Shingles:  no Pneumonia: no Hep C and HIV: hep c neg 2018 Labs: Vitamin D   reports that she has never smoked. She has never used smokeless tobacco. She reports that she does not drink alcohol or use drugs.  Past Medical History:  Diagnosis Date  . Abnormal Pap smear of cervix 1996  . Allergy   . Allergy, unspecified not elsewhere classified   . IBS (irritable bowel syndrome)   . Idiopathic hypersomnia   . 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 CRYOTHERAPY    . SHOULDER SURGERY     right  . TONSILLECTOMY      Current Outpatient Medications  Medication Sig Dispense Refill  . Armodafinil (NUVIGIL) 250 MG tablet Take 1 in AM and 1/2 at noon as directed 135 tablet 1  . escitalopram (LEXAPRO) 20 MG tablet Take 1 tablet (20 mg total) by mouth daily. 90 tablet 1  . estradiol (ESTRACE) 0.5 MG tablet take 1 tablet by mouth once daily 30 tablet 5  . Ibuprofen (ADVIL PO) Take by mouth as needed. Reported on 06/27/2015    . Melatonin 3 MG TABS Take 5 mg by mouth at  bedtime. Reported on 06/27/2015    . progesterone (PROMETRIUM) 100 MG capsule take 1 capsule by mouth once daily 90 capsule 3  . amphetamine-dextroamphetamine (ADDERALL) 10 MG tablet 1 or 2 daily if needed (Patient not taking: Reported on 09/30/2017) 30 tablet 0   No current facility-administered medications for this visit.     Family History  Adopted: Yes  Problem Relation Age of Onset  . Cancer Mother        stomach  . Heart disease Mother   . Heart attack Mother   . Heart disease Father   . Colon cancer Neg Hx     ROS:  Pertinent items are noted in HPI.  Otherwise, a comprehensive ROS was negative.  Exam:   BP 104/62   Pulse 68   Resp 16   Ht 5' 3.25" (1.607 m)   Wt 135 lb (61.2 kg)   LMP 09/06/2012   BMI 23.73 kg/m  Height: 5' 3.25" (160.7 cm) Ht Readings from Last 3 Encounters:  09/30/17 5' 3.25" (1.607 m)  09/22/17  (1.626 m)  03/25/17  (1.626 m)    General appearance: alert, cooperative and appears stated age Head: Normocephalic, without obvious abnormality, atraumatic Neck: no adenopathy, supple, symmetrical, trachea midline and thyroid normal to inspection and palpation  Lungs: clear to auscultation bilaterally Breasts: normal appearance, no masses or tenderness, No nipple retraction or dimpling, No nipple discharge or bleeding, No axillary or supraclavicular adenopathy Heart: regular rate and rhythm Abdomen: soft, non-tender; no masses,  no organomegaly Extremities: extremities normal, atraumatic, no cyanosis or edema Skin: Skin color, texture, turgor normal. No rashes or lesions Lymph nodes: Cervical, supraclavicular, and axillary nodes normal. No abnormal inguinal nodes palpated Neurologic: Grossly normal   Pelvic: External genitalia:  no lesions              Urethra:  normal appearing urethra with no masses, tenderness or lesions              Bartholin's and Skene's: normal                 Vagina: normal appearing vagina with normal color and  discharge, no lesions              Cervix: no cervical motion tenderness, no lesions and normal appearance              Pap taken: No. Bimanual Exam:  Uterus:  normal size, contour, position, consistency, mobility, non-tender and anteverted              Adnexa: normal adnexa and no mass, fullness, tenderness               Rectovaginal: Confirms               Anus:  normal sphincter tone, no lesions  Chaperone present: yes  A:  Well Woman with normal exam  Menopausal on HRT, denies any warning signs  Adderall/Lexapro management with PCP    P:   Reviewed health and wellness pertinent to exam  Risks/benefits/warning signs of HRT discussed and would like to continue. Discussed importance of advising if concerns with use.  Rx Prometrium see order with instructions  Rx Estrace tablet see order with instructions  Continue follow up with  PCP as indicated  Pap smear: no   counseled on breast self exam, mammography screening, feminine hygiene, use and side effects of HRT, adequate intake of calcium and vitamin D, diet and exercise  return annually or prn  An After Visit Summary was printed and given to the patient.

## 2017-10-01 ENCOUNTER — Other Ambulatory Visit: Payer: Self-pay | Admitting: Certified Nurse Midwife

## 2017-10-01 DIAGNOSIS — E559 Vitamin D deficiency, unspecified: Secondary | ICD-10-CM

## 2017-10-01 LAB — VITAMIN D 25 HYDROXY (VIT D DEFICIENCY, FRACTURES): Vit D, 25-Hydroxy: 26.4 ng/mL — ABNORMAL LOW (ref 30.0–100.0)

## 2017-10-22 ENCOUNTER — Other Ambulatory Visit: Payer: Self-pay | Admitting: Certified Nurse Midwife

## 2017-10-22 ENCOUNTER — Encounter: Payer: Self-pay | Admitting: Certified Nurse Midwife

## 2017-10-22 ENCOUNTER — Encounter: Payer: Self-pay | Admitting: Family Medicine

## 2017-10-22 DIAGNOSIS — N951 Menopausal and female climacteric states: Secondary | ICD-10-CM

## 2017-10-22 MED ORDER — ESTRADIOL 0.5 MG PO TABS: 0.5000 mg | ORAL_TABLET | Freq: Every day | ORAL | 1 refills | Status: DC

## 2017-10-22 MED ORDER — PROGESTERONE MICRONIZED 100 MG PO CAPS: 100.0000 mg | ORAL_CAPSULE | Freq: Every day | ORAL | 0 refills | Status: DC

## 2017-10-22 NOTE — Telephone Encounter (Signed)
Message   ----- Message from Mychart, Generic sent at 10/22/2017 8:04 AM EDT -----    I have a mammogram scheduled for 11/12/17, can I get a refill on Progesterone and Estrodiol until results from test are received? I am currently out of both.    Thanks

## 2017-10-22 NOTE — Telephone Encounter (Signed)
Medication refill request: Progesterone and Estradiol  Last AEX:  09-30-17  Next AEX: 10-07-18  Last MMG (if hormonal medication request): 6-22-18breast U/S WNL  Scheduled for next MMG 11-12-17  Refill authorized: please advise

## 2017-10-23 MED ORDER — ESCITALOPRAM OXALATE 20 MG PO TABS: 20.0000 mg | ORAL_TABLET | Freq: Every day | ORAL | 1 refills | Status: DC

## 2017-11-02 ENCOUNTER — Encounter: Payer: Self-pay | Admitting: Family Medicine

## 2017-11-05 ENCOUNTER — Encounter: Payer: Self-pay | Admitting: Family Medicine

## 2017-11-05 ENCOUNTER — Ambulatory Visit: Payer: Commercial Managed Care - PPO | Admitting: Family Medicine

## 2017-11-05 VITALS — BP 102/68 | HR 80 | Temp 98.2°F | Resp 16 | Ht 63.5 in | Wt 136.0 lb

## 2017-11-05 DIAGNOSIS — Z13220 Encounter for screening for lipoid disorders: Secondary | ICD-10-CM

## 2017-11-05 DIAGNOSIS — Z130 Encounter for screening for diseases of the blood and blood-forming organs and certain disorders involving the immune mechanism: Secondary | ICD-10-CM | POA: Diagnosis not present

## 2017-11-05 DIAGNOSIS — F418 Other specified anxiety disorders: Secondary | ICD-10-CM

## 2017-11-05 DIAGNOSIS — L299 Pruritus, unspecified: Secondary | ICD-10-CM | POA: Diagnosis not present

## 2017-11-05 DIAGNOSIS — Z131 Encounter for screening for diabetes mellitus: Secondary | ICD-10-CM | POA: Diagnosis not present

## 2017-11-05 MED ORDER — HYDROXYZINE HCL 25 MG PO TABS: 25.0000 mg | ORAL_TABLET | Freq: Three times a day (TID) | ORAL | 1 refills | Status: DC | PRN

## 2017-11-05 MED ORDER — TRIAMCINOLONE ACETONIDE 0.1 % EX CREA: 1.0000 "application " | TOPICAL_CREAM | Freq: Two times a day (BID) | CUTANEOUS | 1 refills | Status: DC

## 2017-11-05 NOTE — Patient Instructions (Addendum)
It was good to see you today!  I will let you know after I speak with Tamela and we make a plan We will check routine screening labs today and also look for any cause of your itching For symptomatic itching, you can use the triamcinolone cream as needed, and the hydroxyzine as needed; however remember that this can make you feel sleepy!   I'll be in touch with your labs asap

## 2017-11-05 NOTE — Progress Notes (Addendum)
[facility] [address], [address] [place], [place] [address] [phone] [phone]  Date:  11/05/2017   Name:  Miranda [name]   DOB:  [birth year]   MRN:  [id number]  PCP:  Pearline C[place], Miranda [name]    Chief Complaint: Medication Management (lexapro, not helping)   History of Present Illness:  Miranda [name] is a 57 y.o. very pleasant female patient who presents with the following:  Would like to discuss her medications today I saw her about a year ago and we started lexapro: She had sent me an email about maybe starting on an antidepressant.  Her therapist had suggested lexapro She did take some sort of med- maybe cymbalta- a long time ago She has noted sx of milder depression for many years She had a hard time this past winter.  This past winter was especially bad She will notice lack of motivation and interest in things, poor energy anxiety is not really a problem for her The nuvigil does help her with fatigue Her job is ok, her family is doing well She does get as much exercise as she is able   We put her on lexapro 10, increased to 20, but she did not feel like it was helping her so I asked her to come in for a visit Pt continues to see her therapist/ psychologist on a regular basis  She has suffered from mood symptoms for years and has worked to overcome these  Her therapist suggest starting her on risperdal- pt wonders if I can do this for her Miranda [name] today admitted that she has had hallucinations - auditory and visual- for a couple of years. Her therapsit is aware of this  There is a family history of schizophrenia, and in fact her late sister had schizophrenia She is a pt of Miranda [name]-  [phone] Called and LMOM for Ms. Para MarchDuncan asking her to call me back.  Explained that generally starting patients on antipsychotics is not in my scope of practice and I would like to have her see psychiatry for a consultation if  at all possible  Pt has noted itching intermittently- will be on her back off and on for years, and then on some other areas of her body more recently She has tried some topical agents- cortisone cream She has not seen any bites or rash on her skin  She feels like this itching is coming from the inside as opposed to outside- may be part of her anxiety sx No hives, no angioedema  No new medications  She has tried some OTC oral meds but did not seem to help that much    Patient Active Problem List   Diagnosis Date Noted  . 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 Seronegative arthritis 08/29/2014  . Dyspnea 04/14/2013  . Palpitations 04/14/2013  . 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 ALLERGY 05/10/2007    Past Medical History:  Diagnosis Date  . Abnormal Pap smear of cervix 1996  . Allergy   . Allergy, unspecified not elsewhere classified   . IBS (irritable bowel syndrome)   . Idiopathic hypersomnia   . 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 CRYOTHERAPY    . SHOULDER SURGERY  right  . TONSILLECTOMY      Social History   Tobacco Use  . Smoking status: Never Smoker  . Smokeless tobacco: Never Used  Substance Use Topics  . Alcohol use: No    Alcohol/week: 0.0 oz  . Drug use: No    Family History  Adopted: Yes  Problem Relation Age of Onset  . Cancer Mother        stomach  . Heart disease Mother   . Heart attack Mother   . Heart disease Father   . Colon cancer Neg Hx     Allergies  Allergen Reactions  . Keflex [Cephalexin] Itching  . Lexapro [Escitalopram] Other (See Comments)    LETHARGY    Medication list has been reviewed and updated.  Current Outpatient Medications on File Prior to Visit  Medication Sig Dispense  Refill  . amphetamine-dextroamphetamine (ADDERALL) 10 MG tablet 1 or 2 daily if needed 30 tablet 0  . Armodafinil (NUVIGIL) 250 MG tablet Take 1 in AM and 1/2 at noon as directed 135 tablet 1  . escitalopram (LEXAPRO) 20 MG tablet Take 1 tablet (20 mg total) by mouth daily. 30 tablet 1  . estradiol (ESTRACE) 0.5 MG tablet Take 1 tablet (0.5 mg total) by mouth daily. 30 tablet 1  . Ibuprofen (ADVIL PO) Take by mouth as needed. Reported on 06/27/2015    . Melatonin 3 MG TABS Take 5 mg by mouth at bedtime. Reported on 06/27/2015    . progesterone (PROMETRIUM) 100 MG capsule Take 1 capsule (100 mg total) by mouth daily. 30 capsule 0   No current facility-administered medications on file prior to visit.     Review of Systems:  As per HPI- otherwise negative. No fever or chills No CP or SOB   Physical Examination: Vitals:   11/05/17 1622  BP: 102/68  Pulse: 80  Resp: 16  Temp: 98.2 F (36.8 C)  SpO2: 96%   Vitals:   11/05/17 1622  Weight: 136 lb (61.7 kg)  Height: 5' 3.5" (1.613 m)   Body mass index is 23.71 kg/m. Ideal Body Weight: Weight in (lb) to have BMI = 25: 143.1  GEN: WDWN, NAD, Non-toxic, A & O x 3, normal weight, fit build, looks well  HEENT: Atraumatic, Normocephalic. Neck supple. No masses, No LAD. Ears and Nose: No external deformity. CV: RRR, No M/G/R. No JVD. No thrill. No extra heart sounds. PULM: CTA B, no wheezes, crackles, rhonchi. No retractions. No resp. distress. No accessory muscle use. ABD: S, NT, ND, +BS. No rebound. No HSM. EXTR: No c/c/e NEURO Normal gait.  PSYCH: Normally interactive. Conversant. Not depressed or anxious appearing.  Calm demeanor.  She has some excoriations across her back and on her feet and hands.    Assessment and Plan: Itching - Plan: Comprehensive metabolic panel, TSH, Sedimentation rate, C-reactive protein, triamcinolone cream (KENALOG) 0.1 %, hydrOXYzine (ATARAX/VISTARIL) 25 MG tablet  Depression with anxiety - Plan:  TSH  Screening for deficiency anemia - Plan: CBC  Screening for diabetes mellitus - Plan: Comprehensive metabolic panel, Hemoglobin A1c  Screening for hyperlipidemia - Plan: Lipid panel  Routine labs today For itching, will obtain labs as above rx for triamcinolone cream and hydroxyzine  It was good to see you today!  I will let you know after I speak with Tamela and we make a plan We will check routine screening labs today and also look for any cause of your itching For symptomatic itching, you can use the triamcinolone  cream as needed, and the hydroxyzine as needed; however remember that this can make you feel sleepy!    Signed Abbe Amsterdam, [name]  6/16 addnd received her labs, and I also was able to speak with Ms. Para March this past Friday.  I certainly agree that a antipsychic med may be indicated, but would not generally start this without at least a phone conversation with a psychiatrist who will be taking care of the pt.   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  If so, and if she advised me to do so I can start her on risperdal   See mychart message to pt Results for orders placed or performed in visit on 11/05/17  CBC  Result Value Ref Range   WBC 7.4 4.0 - 10.5 K/uL   RBC 4.58 3.87 - 5.11 Mil/uL   Platelets 199.0 150.0 - 400.0 K/uL   Hemoglobin 14.7 12.0 - 15.0 g/dL   HCT 16.1 09.6 - 04.5 %   MCV 95.2 78.0 - 100.0 fl   MCHC 33.6 30.0 - 36.0 g/dL   RDW 40.9 81.1 - 91.4 %  Comprehensive metabolic panel  Result Value Ref Range   Sodium 140 135 - 145 mEq/L   Potassium 4.4 3.5 - 5.1 mEq/L   Chloride 100 96 - 112 mEq/L   CO2 31 19 - 32 mEq/L   Glucose, Bld 79 70 - 99 mg/dL   BUN 18 6 - 23 mg/dL   Creatinine, Ser 7.82 0.40 - 1.20 mg/dL   Total Bilirubin 0.4 0.2 - 1.2 mg/dL   Alkaline Phosphatase 88 39 - 117 U/L   AST 16 0 - 37 U/L   ALT 14  0 - 35 U/L   Total Protein 6.5 6.0 - 8.3 g/dL   Albumin 4.2 3.5 - 5.2 g/dL   Calcium 95.6 8.4 - 21.3 mg/dL   GFR 08.65 (L) >78.46 mL/min  Hemoglobin A1c  Result Value Ref Range   Hgb A1c MFr Bld 5.7 4.6 - 6.5 %  Lipid panel  Result Value Ref Range   Cholesterol 223 (H) 0 - 200 mg/dL   Triglycerides 962.9 0.0 - 149.0 mg/dL   HDL 52.84 >13.24 mg/dL   VLDL 40.1 0.0 - 02.7 mg/dL   LDL Cholesterol 253 (H) 0 - 99 mg/dL   Total CHOL/HDL Ratio 3    NonHDL 150.62   TSH  Result Value Ref Range   TSH 2.01 0.35 - 4.50 uIU/mL  Sedimentation rate  Result Value Ref Range   Sed Rate 2 0 - 30 mm/hr  C-reactive protein  Result Value Ref Range   CRP 0.2 (L) 0.5 - 20.0 mg/dL

## 2017-11-06 LAB — LIPID PANEL
CHOL/HDL RATIO: 3
Cholesterol: 223 mg/dL — ABNORMAL HIGH (ref 0–200)
HDL: 72.5 mg/dL (ref >39.00)
LDL CALC: 128 mg/dL — AB (ref 0–99)
NonHDL: 150.62
Triglycerides: 112 mg/dL (ref 0.0–149.0)
VLDL: 22.4 mg/dL (ref 0.0–40.0)

## 2017-11-06 LAB — COMPREHENSIVE METABOLIC PANEL
ALT: 14 U/L (ref 0–35)
AST: 16 U/L (ref 0–37)
Albumin: 4.2 g/dL (ref 3.5–5.2)
Alkaline Phosphatase: 88 U/L (ref 39–117)
BUN: 18 mg/dL (ref 6–23)
CO2: 31 meq/L (ref 19–32)
Calcium: 10 mg/dL (ref 8.4–10.5)
Chloride: 100 mEq/L (ref 96–112)
Creatinine, Ser: 1.18 mg/dL (ref 0.40–1.20)
GFR: 50.11 mL/min — AB (ref >60.00)
GLUCOSE: 79 mg/dL (ref 70–99)
POTASSIUM: 4.4 meq/L (ref 3.5–5.1)
SODIUM: 140 meq/L (ref 135–145)
Total Bilirubin: 0.4 mg/dL (ref 0.2–1.2)
Total Protein: 6.5 g/dL (ref 6.0–8.3)

## 2017-11-06 LAB — SEDIMENTATION RATE: SED RATE: 2 mm/h (ref 0–30)

## 2017-11-06 LAB — CBC
HCT: 43.6 % (ref 36.0–46.0)
HEMOGLOBIN: 14.7 g/dL (ref 12.0–15.0)
MCHC: 33.6 g/dL (ref 30.0–36.0)
MCV: 95.2 fl (ref 78.0–100.0)
Platelets: 199 10*3/uL (ref 150.0–400.0)
RBC: 4.58 Mil/uL (ref 3.87–5.11)
RDW: 13 % (ref 11.5–15.5)
WBC: 7.4 10*3/uL (ref 4.0–10.5)

## 2017-11-06 LAB — C-REACTIVE PROTEIN: CRP: 0.2 mg/dL — ABNORMAL LOW (ref 0.5–20.0)

## 2017-11-06 LAB — TSH: TSH: 2.01 u[IU]/mL (ref 0.35–4.50)

## 2017-11-06 LAB — HEMOGLOBIN A1C: Hgb A1c MFr Bld: 5.7 % (ref 4.6–6.5)

## 2017-11-08 ENCOUNTER — Encounter: Payer: Self-pay | Admitting: Family Medicine

## 2017-11-12 ENCOUNTER — Ambulatory Visit
Admission: RE | Admit: 2017-11-12 | Discharge: 2017-11-12 | Disposition: A | Discharge status: Home / Self Care | Payer: Commercial Managed Care - PPO | Source: Ambulatory Visit | Attending: Certified Nurse Midwife | Admitting: Certified Nurse Midwife

## 2017-11-12 DIAGNOSIS — Z1231 Encounter for screening mammogram for malignant neoplasm of breast: Secondary | ICD-10-CM | POA: Diagnosis not present

## 2018-01-09 ENCOUNTER — Telehealth: Payer: Self-pay | Admitting: Family Medicine

## 2018-01-09 ENCOUNTER — Other Ambulatory Visit: Payer: Self-pay | Admitting: Internal Medicine

## 2018-01-11 ENCOUNTER — Telehealth: Payer: Self-pay | Admitting: Internal Medicine

## 2018-01-11 NOTE — Telephone Encounter (Signed)
Pt is requesting a new adderall refill per pharmacy.  Last refill:  Adderall 10mg  09/22/2017 #30 with 0 refills, take 1-2 tabs daily as needed. Last ov: 09/22/17  CY please advise on rx refill.  Thanks!

## 2018-01-11 NOTE — Telephone Encounter (Signed)
CY Please advise on refill. Thanks.  

## 2018-01-11 NOTE — Telephone Encounter (Signed)
Ok refill # 135, refill x1 ( for total 6 months)

## 2018-01-11 NOTE — Telephone Encounter (Signed)
Done

## 2018-01-12 ENCOUNTER — Other Ambulatory Visit: Payer: Self-pay

## 2018-01-12 MED ORDER — ESCITALOPRAM OXALATE 20 MG PO TABS: 20.0000 mg | ORAL_TABLET | Freq: Every day | ORAL | 5 refills | Status: DC

## 2018-01-12 NOTE — Telephone Encounter (Signed)
Miranda [name] with [facility] calling and states that they need the medication sent over to the pharmacy again. States that they received a denied request. Please advise. CB#: [phone]

## 2018-01-12 NOTE — Telephone Encounter (Signed)
Medication resent

## 2018-01-12 NOTE — Addendum Note (Signed)
Addended by: Steve RattlerBLEVINS, Francisca Langenderfer A on: 01/12/2018 10:48 AM   Modules accepted: Orders

## 2018-01-13 NOTE — Telephone Encounter (Signed)
TP would you be willing to refill this as CY is not available? Thanks.

## 2018-01-14 ENCOUNTER — Other Ambulatory Visit: Payer: Self-pay | Admitting: *Deleted

## 2018-01-14 ENCOUNTER — Telehealth: Payer: Self-pay | Admitting: Internal Medicine

## 2018-01-14 MED ORDER — AMPHETAMINE-DEXTROAMPHETAMINE 10 MG PO TABS: ORAL_TABLET | ORAL | 0 refills | Status: DC

## 2018-01-14 NOTE — Telephone Encounter (Signed)
Rx has been signed and placed up front for pickup. Pt is aware and voiced her understanding. Nothing further is needed.  

## 2018-01-14 NOTE — Telephone Encounter (Signed)
done

## 2018-01-14 NOTE — Telephone Encounter (Signed)
Patient requesting refill for Adderal 10mg .  02/10/18 phone note, stated refill ok, done, but never printed. Adderal 10mg , #30, no refills, printed, and placed on Dr. Roxy CedarYoung's cart, in his office, to be signed.    Will route to Dr. Maple HudsonYoung

## 2018-01-14 NOTE — Telephone Encounter (Signed)
Please pt's email.

## 2018-01-18 NOTE — Telephone Encounter (Signed)
Note    Rx has been signed and placed up front for pickup. Pt is aware and voiced her understanding.  Nothing further is needed.       Will close this encounter since the patient has been taken care of.

## 2018-01-19 DIAGNOSIS — M542 Cervicalgia: Secondary | ICD-10-CM | POA: Diagnosis not present

## 2018-02-11 ENCOUNTER — Telehealth: Payer: Self-pay

## 2018-02-11 NOTE — Telephone Encounter (Signed)
Appointment scheduled for tomorrow at 3:30 to have vitamin d recheck

## 2018-02-12 ENCOUNTER — Other Ambulatory Visit: Payer: Commercial Managed Care - PPO

## 2018-02-15 ENCOUNTER — Other Ambulatory Visit (INDEPENDENT_AMBULATORY_CARE_PROVIDER_SITE_OTHER): Payer: Commercial Managed Care - PPO

## 2018-02-15 DIAGNOSIS — E559 Vitamin D deficiency, unspecified: Secondary | ICD-10-CM

## 2018-02-16 LAB — VITAMIN D 25 HYDROXY (VIT D DEFICIENCY, FRACTURES): Vit D, 25-Hydroxy: 32.6 ng/mL (ref 30.0–100.0)

## 2018-03-24 ENCOUNTER — Ambulatory Visit: Payer: Commercial Managed Care - PPO | Admitting: Internal Medicine

## 2018-03-24 ENCOUNTER — Encounter: Payer: Self-pay | Admitting: Internal Medicine

## 2018-03-24 VITALS — BP 100/68 | HR 91 | Ht 64.0 in | Wt 138.2 lb

## 2018-03-24 DIAGNOSIS — G4700 Insomnia, unspecified: Secondary | ICD-10-CM

## 2018-03-24 DIAGNOSIS — G4711 Idiopathic hypersomnia with long sleep time: Secondary | ICD-10-CM | POA: Diagnosis not present

## 2018-03-24 MED ORDER — ZALEPLON 5 MG PO CAPS: ORAL_CAPSULE | ORAL | 5 refills | Status: DC

## 2018-03-24 MED ORDER — ARMODAFINIL 250 MG PO TABS: ORAL_TABLET | ORAL | 5 refills | Status: DC

## 2018-03-24 MED ORDER — ARMODAFINIL 250 MG PO TABS: ORAL_TABLET | ORAL | 1 refills | Status: DC

## 2018-03-24 NOTE — Patient Instructions (Signed)
Script Engineer, materials to try Sonata for sleep if needed  Please call if we can help

## 2018-03-24 NOTE — Progress Notes (Signed)
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  Still occasionally very tired during the day.  Think she gets enough sleep, but sleeps better if she takes melatonin.  No other sedating medicines.  Does take an occasional nap. We discussed trying to add Adderall 10 mg for occasional extra use if needed.  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  Occasionally uses one half of a 10 mg Adderall has a "boost" but not daily.  Naps when she can and is careful with sleep habits.  Admits occasional difficulty initiating and maintaining sleep.  This does not seem associated with days she takes Adderall and has more to do with work and life stress.  ROS-see HPI   + = positive Constitutional:    weight loss, night sweats, fevers, chills, + fatigue, lassitude. HEENT:    headaches, difficulty swallowing, tooth/dental problems, sore throat,       sneezing, itching, ear ache, nasal congestion, post nasal drip, snoring CV:    chest pain, orthopnea, PND, swelling in lower extremities, anasarca,                                                    dizziness, palpitations Resp:    shortness of breath with exertion or at rest.                productive cough,   non-productive cough, coughing up of blood.              change in color of mucus.  wheezing.   Skin:    rash or lesions. GI:  No-   heartburn, indigestion, abdominal pain, nausea, vomiting,e GU: . MS:   joint pain, stiffness, . Neuro-    + auditory hallucination Psych:  change in mood or affect.  depression or anxiety.   memory loss.  OBJ- Physical Exam General- Alert, Oriented, Affect-appropriate, Distress- none acute, + trim Skin- rash-none, lesions- none, excoriation- none Lymphadenopathy- none Head- atraumatic            Eyes- Gross vision intact, PERRLA, conjunctivae and secretions clear            Ears- Hearing, canals-normal            Nose- Clear, no-Septal dev, mucus, polyps, erosion, perforation             Throat- Mallampati II , mucosa clear , drainage- none, tonsils- atrophic Neck- flexible , trachea midline, no stridor , thyroid nl, carotid no bruit Chest - symmetrical excursion , unlabored           Heart/CV- RRR , no murmur , no gallop  , no rub, nl s1 s2                           -  JVD- none , edema- none, stasis changes- none, varices- none           Lung- clear to P&A, wheeze- none, cough- none , dullness-none, rub- none           Chest wall-  Abd-  Br/ Gen/ Rectal- Not done, not indicated Extrem- cyanosis- none, clubbing, none, atrophy- none, strength- nl Neuro- grossly intact to observation     

## 2018-05-22 NOTE — Assessment & Plan Note (Signed)
Stable pattern of longstanding.  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  She did not do well with trazodone in the past.  We discussed trying a short-half-life product for trial. Plan-try Sonata as discussed.

## 2018-05-22 NOTE — Assessment & Plan Note (Signed)
She tries to maintain good sleep habits including daytime naps possible.  Ronne Binninguvigil has worked well with very occasional need for low-dose Adderall. Plan-refill Nuvigil

## 2018-05-22 NOTE — Progress Notes (Signed)
[facility] [address], [address] [place], [place] [address] [phone] [phone]  Date:  05/27/2018   Name:  Miranda [name]   DOB:  10-05-1960   MRN:  [id number]  PCP:  Pearline Cablesopland, Jessica C, MD    Chief Complaint: Follow-up (kidney function) and Shortness of Breath (2 months, chest pain, fatigue-more thn usual)   History of Present Illness:  Miranda [name] is a 57 y.o. very pleasant female patient who presents with the following:  Miranda [name] is here today to recheck her kidney function. In June we had noticed that her GFR was slightly reduced, and will recheck today to follow-up In the past her kidney function has been normal to minimally reduced-we only have data going back to 2014  However today she also notes SOB for 2-2.5 months She has had this in the past but it resolved, never this persistent She does feel like her exercise tolerance is less than normal She is more fatigued  She has also noted some intermittent, sporadic chest pains over the last 7- 10 days.  Would last maybe a minute.   Does not follow any pattern No CP now She does not have any personal history of CAD She is adopted, her birth mom did have an MI but they are not sure if this was related to cancer She is a non- smoker and does not drink Chest pain is not associated with any activity, no current chest pain Flu is up-to-date  She saw cardiology back in 2014 due to palpitations, had an echo which was normal  Patient Active Problem List   Diagnosis Date Noted  . 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 Seronegative arthritis 08/29/2014  . Dyspnea 04/14/2013  . Palpitations 04/14/2013  . 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 ALLERGY 05/10/2007    Past Medical History:  Diagnosis Date   . Abnormal Pap smear of cervix 1996  . Allergy   . Allergy, unspecified not elsewhere classified   . IBS (irritable bowel syndrome)   . Idiopathic hypersomnia   . 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 CRYOTHERAPY    . SHOULDER SURGERY     right  . TONSILLECTOMY      Social History   Tobacco Use  . Smoking status: Never Smoker  . Smokeless tobacco: Never Used  Substance Use Topics  . Alcohol use: No    Alcohol/week: 0.0 standard drinks  . Drug use: No    Family History  Adopted: Yes  Problem Relation Age of Onset  . Cancer Mother        stomach  . Heart disease Mother   . Heart attack Mother   . Heart disease Father   . Colon cancer Neg Hx     Allergies  Allergen Reactions  . Keflex [Cephalexin] Itching    Medication list has been reviewed and updated.  Current Outpatient Medications on File Prior to Visit  Medication Sig Dispense Refill  . amphetamine-dextroamphetamine (ADDERALL) 10 MG tablet 1 or 2 daily if needed 30 tablet 0  . Armodafinil 250 MG tablet TAKE 1 TABLET BY MOUTH IN THE MORNING AND 1/2 TABLET AT NOON AS DIRECTED 30 tablet 5  . escitalopram (LEXAPRO) 20 MG tablet Take 1 tablet (  20 mg total) by mouth daily. 30 tablet 5  . estradiol (ESTRACE) 0.5 MG tablet Take 1 tablet (0.5 mg total) by mouth daily. 30 tablet 1  . hydrOXYzine (ATARAX/VISTARIL) 25 MG tablet Take 1 tablet (25 mg total) by mouth 3 (three) times daily as needed. Use for itching 60 tablet 1  . Ibuprofen (ADVIL PO) Take by mouth as needed. Reported on 06/27/2015    . Melatonin 3 MG TABS Take 5 mg by mouth at bedtime. Reported on 06/27/2015    . progesterone (PROMETRIUM) 100 MG capsule Take 1 capsule (100 mg total) by mouth daily. 30 capsule 0  . triamcinolone cream (KENALOG) 0.1 % Apply 1 application topically 2 (two) times daily. Use on itchy areas 45 g 1  . zaleplon (SONATA) 5 MG capsule 1-2 caps as needed for sleep  30 capsule 5   No current facility-administered medications on file prior to visit.     Review of Systems:  As per HPI- otherwise negative. Never had a DVT or PE  No long trips recently, no calf pain, no cough or wheezing.  No hemoptysis.  Physical Examination: Vitals:   05/27/18 1428  BP: 124/78  Pulse: 88  Resp: 16  SpO2: 98%   Vitals:   05/27/18 1428  Weight: 141 lb (64 kg)  Height: 5\' 4"  (1.626 m)   Body mass index is 24.2 kg/m. Ideal Body Weight: Weight in (lb) to have BMI = 25: 145.3  GEN: WDWN, NAD, Non-toxic, A & O x 3, normal weight, looks well HEENT: Atraumatic, Normocephalic. Neck supple. No masses, No LAD.  Bilateral TM wnl, oropharynx normal.  PEERL,EOMI.   Ears and Nose: No external deformity. CV: RRR, No M/G/R. No JVD. No thrill. No extra heart sounds. PULM: CTA B, no wheezes, crackles, rhonchi. No retractions. No resp. distress. No accessory muscle use. ABD: S, NT, ND. No rebound. No HSM. EXTR: No c/c/e NEURO Normal gait.  PSYCH: Normally interactive. Conversant. Not depressed or anxious appearing.  Calm demeanor.  No calf swelling or tenderness  EKG today is normal, no ST changes  C/W EKG from 2017 which is similar   Assessment and Plan: Decreased GFR - Plan: Basic metabolic panel  Chest pain, unspecified type - Plan: EKG 12-Lead, Troponin I -, Exercise Tolerance Test  SOB (shortness of breath) - Plan: Troponin I -, CBC, DG Chest 2 View, Exercise Tolerance Test  Miranda [name] had come in today to recheck her kidney function, but also notes shortness of breath for 10 weeks, and also chest pain intermittently for 10 days. Will obtain BMP as above, and also further evaluate chest pain and shortness of breath with a chest x-ray and stress test. EKG is normal today, and she does not currently have chest pain. We will obtain a troponin level today I have also offered the option of going to the emergency room for evaluation immediately, which she declines  I  have asked her take a baby aspirin once a day while her stress test is pending, and to seek immediate care if her symptoms worsen or change  Signed Abbe Amsterdam, MD  Received her labs and chest film as below, will send a MyChart message to patient.  We had agreed to communicate through my chart unless something was urgent Dg Chest 2 View  Result Date: 05/27/2018 CLINICAL DATA:  57 y/o F; mid to right chest pain for a few weeks. Two months of shortness of breath. EXAM: CHEST - 2 VIEW COMPARISON:  05/14/2016 chest  radiograph FINDINGS: Stable heart size and mediastinal contours are within normal limits. Both lungs are clear. The visualized skeletal structures are unremarkable. IMPRESSION: No acute pulmonary process identified. Electronically Signed   By: Mitzi HansenLance  Furusawa-Stratton M.D.   On: 05/27/2018 15:18   Results for orders placed or performed in visit on 05/27/18  Basic metabolic panel  Result Value Ref Range   Sodium 138 135 - 145 mEq/L   Potassium 4.3 3.5 - 5.1 mEq/L   Chloride 100 96 - 112 mEq/L   CO2 32 19 - 32 mEq/L   Glucose, Bld 85 70 - 99 mg/dL   BUN 21 6 - 23 mg/dL   Creatinine, Ser 9.141.16 0.40 - 1.20 mg/dL   Calcium 9.7 8.4 - 78.210.5 mg/dL   GFR 95.6251.01 (L) >13.08>60.00 mL/min  Troponin I -  Result Value Ref Range   TNIDX 0.00 0.00 - 0.06 ug/l  CBC  Result Value Ref Range   WBC 7.4 4.0 - 10.5 K/uL   RBC 4.70 3.87 - 5.11 Mil/uL   Platelets 201.0 150.0 - 400.0 K/uL   Hemoglobin 15.0 12.0 - 15.0 g/dL   HCT 65.744.5 84.636.0 - 96.246.0 %   MCV 94.7 78.0 - 100.0 fl   MCHC 33.6 30.0 - 36.0 g/dL   RDW 95.212.9 84.111.5 - 32.415.5 %

## 2018-05-27 ENCOUNTER — Encounter: Payer: Self-pay | Admitting: Family Medicine

## 2018-05-27 ENCOUNTER — Ambulatory Visit: Payer: Commercial Managed Care - PPO | Admitting: Family Medicine

## 2018-05-27 ENCOUNTER — Ambulatory Visit (HOSPITAL_BASED_OUTPATIENT_CLINIC_OR_DEPARTMENT_OTHER)
Admission: RE | Admit: 2018-05-27 | Discharge: 2018-05-27 | Disposition: A | Discharge status: Home / Self Care | Payer: Commercial Managed Care - PPO | Source: Ambulatory Visit | Attending: Family Medicine | Admitting: Family Medicine

## 2018-05-27 VITALS — BP 124/78 | HR 88 | Resp 16 | Ht 64.0 in | Wt 141.0 lb

## 2018-05-27 DIAGNOSIS — R944 Abnormal results of kidney function studies: Secondary | ICD-10-CM

## 2018-05-27 DIAGNOSIS — R0602 Shortness of breath: Secondary | ICD-10-CM | POA: Insufficient documentation

## 2018-05-27 DIAGNOSIS — R079 Chest pain, unspecified: Secondary | ICD-10-CM

## 2018-05-27 LAB — BASIC METABOLIC PANEL
BUN: 21 mg/dL (ref 6–23)
CO2: 32 mEq/L (ref 19–32)
CREATININE: 1.16 mg/dL (ref 0.40–1.20)
Calcium: 9.7 mg/dL (ref 8.4–10.5)
Chloride: 100 mEq/L (ref 96–112)
GFR: 51.01 mL/min — AB (ref >60.00)
Glucose, Bld: 85 mg/dL (ref 70–99)
Potassium: 4.3 mEq/L (ref 3.5–5.1)
Sodium: 138 mEq/L (ref 135–145)

## 2018-05-27 LAB — CBC
HCT: 44.5 % (ref 36.0–46.0)
Hemoglobin: 15 g/dL (ref 12.0–15.0)
MCHC: 33.6 g/dL (ref 30.0–36.0)
MCV: 94.7 fl (ref 78.0–100.0)
Platelets: 201 10*3/uL (ref 150.0–400.0)
RBC: 4.7 Mil/uL (ref 3.87–5.11)
RDW: 12.9 % (ref 11.5–15.5)
WBC: 7.4 10*3/uL (ref 4.0–10.5)

## 2018-05-27 LAB — TROPONIN I: TNIDX: 0 ug/L (ref 0.00–0.06)

## 2018-05-27 NOTE — Patient Instructions (Addendum)
We will check on your kidney function today As far as your shortness of breath and episodic chest pain, we will evaluate this with labs, chest x-ray, and a stress test.  The labs and chest x-ray will be done today, we will get the stress test set up as soon as possible. Please take a baby aspirin once a day in the meantime.  If your symptoms are changing or getting worse, please seek care right away.  An emergency room visit is warranted if you have any persistent chest pain or worsening shortness of breath I will check a test called a troponin today, this looks for any sign of damage to the heart muscle.  If this test is positive we will have you go to the emergency room

## 2018-06-02 ENCOUNTER — Encounter: Payer: Self-pay | Admitting: Family Medicine

## 2018-06-02 ENCOUNTER — Ambulatory Visit (INDEPENDENT_AMBULATORY_CARE_PROVIDER_SITE_OTHER): Payer: Commercial Managed Care - PPO

## 2018-06-02 ENCOUNTER — Other Ambulatory Visit: Payer: Self-pay | Admitting: Family Medicine

## 2018-06-02 DIAGNOSIS — R0602 Shortness of breath: Secondary | ICD-10-CM

## 2018-06-02 DIAGNOSIS — R079 Chest pain, unspecified: Secondary | ICD-10-CM

## 2018-06-02 LAB — EXERCISE TOLERANCE TEST
CHL CUP RESTING HR STRESS: 85 {beats}/min
Estimated workload: 11.7 METS
Exercise duration (min): 9 min
Exercise duration (sec): 30 s
MPHR: 163 {beats}/min
Peak HR: 155 {beats}/min
Percent HR: 95 %
RPE: 17

## 2018-06-25 ENCOUNTER — Encounter: Payer: Self-pay | Admitting: Cardiology

## 2018-06-25 ENCOUNTER — Other Ambulatory Visit: Payer: Self-pay | Admitting: Cardiovascular Disease

## 2018-06-25 ENCOUNTER — Ambulatory Visit: Payer: Commercial Managed Care - PPO | Admitting: Cardiology

## 2018-06-25 VITALS — BP 106/64 | HR 91 | Ht 64.0 in | Wt 143.1 lb

## 2018-06-25 DIAGNOSIS — R002 Palpitations: Secondary | ICD-10-CM

## 2018-06-25 DIAGNOSIS — R9439 Abnormal result of other cardiovascular function study: Secondary | ICD-10-CM

## 2018-06-25 DIAGNOSIS — R0609 Other forms of dyspnea: Secondary | ICD-10-CM

## 2018-06-25 DIAGNOSIS — R0789 Other chest pain: Secondary | ICD-10-CM

## 2018-06-25 DIAGNOSIS — R0600 Dyspnea, unspecified: Secondary | ICD-10-CM

## 2018-06-25 NOTE — Progress Notes (Signed)
Cardiology Consultation:    Date:  06/25/2018   ID:  [name], DOB August 22, 1960, MRN [id number]  PCP:  Pearline Cables, MD  Cardiologist:  Gypsy Balsam, MD   Referring MD: Pearline Cables, MD   Chief Complaint  Patient presents with  . Chest Pain  . Shortness of Breath  I have shortness of breath  History of Present Illness:    Miranda [name] is a 58 y.o. female who is being seen today for the evaluation of shortness of breath at the request of Copland, Gwenlyn Found, MD.  For few months she experience shortness of breath.  She used to exercise on the regular basis but couple years ago she started having some issue with the spine and also muscle aches and she started doing routine exercises now she does it very rarely in the matter-of-fact last week she tried to do some exercise she walk may be to 3 minutes and became short of breath and stop.  She also complained of having some chest pain.  Pain is atypical sharp stabbing like lasting for a minute not related to exercise.  It happens few times a month.  She had one episode of chest pain yesterday.  There is no aggravating or relieving factors.  Recently she had a stress test she walked 7 minutes on the treadmill.  She did not have any EKG changes that would suggest ischemia however her blood pressure response was somewhat blunted.  Couple years ago she was diagnosed with some rheumatological problem she was told that this is unclear diagnosis there was some issue about her potentially having lupus potentially having rheumatoid arthritis apparently she was on some medications for it she remember ward Remicade however she stopped taking it because she was worried about side effects.  She used to be very active and exercise on the regular basis now she does not do it because of all problems as mentioning above. He never smoked Her cholesterol is elevated LDL 128 however HDL is 72.  Past Medical History:  Diagnosis Date    . Abnormal Pap smear of cervix 1996  . Allergy   . Allergy, unspecified not elsewhere classified   . IBS (irritable bowel syndrome)   . Idiopathic hypersomnia   . 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 CRYOTHERAPY    . SHOULDER SURGERY     right  . TONSILLECTOMY      Current Medications: Current Meds  Medication Sig  . amphetamine-dextroamphetamine (ADDERALL) 10 MG tablet 1 or 2 daily if needed  . Armodafinil 250 MG tablet TAKE 1 TABLET BY MOUTH IN THE MORNING AND 1/2 TABLET AT NOON AS DIRECTED  . escitalopram (LEXAPRO) 20 MG tablet Take 1 tablet (20 mg total) by mouth daily.  Marland Kitchen estradiol (ESTRACE) 0.5 MG tablet Take 1 tablet (0.5 mg total) by mouth daily.  . hydrOXYzine (ATARAX/VISTARIL) 25 MG tablet Take 1 tablet (25 mg total) by mouth 3 (three) times daily as needed. Use for itching  . Ibuprofen (ADVIL PO) Take by mouth as needed. Reported on 06/27/2015  . Melatonin 3 MG TABS Take 5 mg by mouth at bedtime. Reported on 06/27/2015  . progesterone (PROMETRIUM) 100 MG capsule Take 1 capsule (100 mg total) by mouth daily.  Marland Kitchen triamcinolone cream (KENALOG) 0.1 % Apply 1 application topically 2 (two) times daily. Use on itchy areas  . zaleplon (SONATA) 5 MG capsule  1-2 caps as needed for sleep     Allergies:   Keflex [cephalexin]   Social History   Socioeconomic History  . Marital status: Single    Spouse name: Not on file  . Number of children: Not on file  . Years of education: Not on file  . Highest education level: Not on file  Occupational History  . Not on file  Social Needs  . Financial resource strain: Not on file  . Food insecurity:    Worry: Not on file    Inability: Not on file  . Transportation needs:    Medical: Not on file    Non-medical: Not on file  Tobacco Use  . Smoking status: Never Smoker  . Smokeless tobacco: Never Used  Substance and Sexual Activity  . Alcohol use: No     Alcohol/week: 0.0 standard drinks  . Drug use: No  . Sexual activity: Yes    Partners: Female    Birth control/protection: None, Post-menopausal  Lifestyle  . Physical activity:    Days per week: Not on file    Minutes per session: Not on file  . Stress: Not on file  Relationships  . Social connections:    Talks on phone: Not on file    Gets together: Not on file    Attends religious service: Not on file    Active member of club or organization: Not on file    Attends meetings of clubs or organizations: Not on file    Relationship status: Not on file  Other Topics Concern  . Not on file  Social History Narrative  . Not on file     Family History: The patient's family history includes Cancer in her mother; Heart attack in her mother; Heart disease in her father and mother. There is no history of Colon cancer. She was adopted. ROS:   Please see the history of present illness.    All 14 point review of systems negative except as described per history of present illness.  EKGs/Labs/Other Studies Reviewed:    The following studies were reviewed today: Stress test showed no evidence of ischemia 7.1-minute walking on the treadmill.    Recent Labs: 11/05/2017: ALT 14; TSH 2.01 05/27/2018: BUN 21; Creatinine, Ser 1.16; Hemoglobin 15.0; Platelets 201.0; Potassium 4.3; Sodium 138  Recent Lipid Panel    Component Value Date/Time   CHOL 223 (H) 11/05/2017 1651   TRIG 112.0 11/05/2017 1651   HDL 72.50 11/05/2017 1651   CHOLHDL 3 11/05/2017 1651   VLDL 22.4 11/05/2017 1651   LDLCALC 128 (H) 11/05/2017 1651    Physical Exam:    VS:  BP 106/64   Pulse 91   Ht 5\' 4"  (1.626 m)   Wt 143 lb 1.9 oz (64.9 kg)   LMP 09/06/2012   SpO2 96%   BMI 24.57 kg/m     Wt Readings from Last 3 Encounters:  06/25/18 143 lb 1.9 oz (64.9 kg)  05/27/18 141 lb (64 kg)  03/24/18 138 lb 3.2 oz (62.7 kg)     GEN:  Well nourished, well developed in no acute distress HEENT: Normal NECK: No JVD;  No carotid bruits LYMPHATICS: No lymphadenopathy CARDIAC: RRR, no murmurs, no rubs, no gallops RESPIRATORY:  Clear to auscultation without rales, wheezing or rhonchi  ABDOMEN: Soft, non-tender, non-distended MUSCULOSKELETAL:  No edema; No deformity  SKIN: Warm and dry NEUROLOGIC:  Alert and oriented x 3 PSYCHIATRIC:  Normal affect   ASSESSMENT:    1. Dyspnea  on exertion   2. Atypical chest pain   3. Palpitations   4. Abnormal stress test    PLAN:    In order of problems listed above:  1. Dyspnea on exertion obviously concerning.  Stress test negative for ischemia however with her risk factors I still think for prognostic reasons will be reasonable to perform CT of her chest to look for calcium score.  Obviously she also have echocardiogram since she was told before that she may have PFO or ASD. 2. Typical chest pain recent stress test negative for risk stratification will do calcium score. 3. Palpitations denies having any lately 4. Abnormal stress test which showed blunted blood pressure response.  Work-up as described above. 5. Overall I suggested her need to see a rheumatologist to clarify her rheumatological diagnosis.   Medication Adjustments/Labs and Tests Ordered: Current medicines are reviewed at length with the patient today.  Concerns regarding medicines are outlined above.  No orders of the defined types were placed in this encounter.  No orders of the defined types were placed in this encounter.   Signed, Georgeanna Lea, MD, [facility]. 06/25/2018 10:22 AM    [facility]

## 2018-06-25 NOTE — Patient Instructions (Signed)
Medication Instructions:  Your physician recommends that you continue on your current medications as directed. Please refer to the Current Medication list given to you today.  If you need a refill on your cardiac medications before your next appointment, please call your pharmacy.   Lab work: None ordered If you have labs (blood work) drawn today and your tests are completely normal, you will receive your results only by: Marland Kitchen MyChart Message (if you have MyChart) OR . A paper copy in the mail If you have any lab test that is abnormal or we need to change your treatment, we will call you to review the results.  Testing/Procedures: Your physician has requested that you have an echocardiogram. Echocardiography is a painless test that uses sound waves to create images of your heart. It provides your doctor with information about the size and shape of your heart and how well your heart's chambers and valves are working. This procedure takes approximately one hour. There are no restrictions for this procedure.  Non-Cardiac CT scanning, (CAT scanning), is a noninvasive, special x-ray that produces cross-sectional images of the body using x-rays and a computer. CT scans help physicians diagnose and treat medical conditions. For some CT exams, a contrast material is used to enhance visibility in the area of the body being studied. CT scans provide greater clarity and reveal more details than regular x-ray exams.   Follow-Up: At [facility], you and your health needs are our priority.  As part of our continuing mission to provide you with exceptional heart care, we have created designated Provider Care Teams.  These Care Teams include your primary Cardiologist (physician) and Advanced Practice Providers (APPs -  Physician Assistants and Nurse Practitioners) who all work together to provide you with the care you need, when you need it. You will need a follow up appointment in 1 months.  Please call our  office 2 months in advance to schedule this appointment.  You may see  Gypsy Balsam or another member of our [employer] Provider Team in [place]: Norman Herrlich, MD . Belva Crome, MD  Any Other Special Instructions Will Be Listed Below (If Applicable). Your CT Calcium score has been set up at our [facility] location. The address is [address], [place], [place] [address]. Your appointment is February 18 at 4:30. Please remember to bring $150 to your appointment.

## 2018-07-08 ENCOUNTER — Other Ambulatory Visit (HOSPITAL_BASED_OUTPATIENT_CLINIC_OR_DEPARTMENT_OTHER): Payer: Commercial Managed Care - PPO

## 2018-07-13 ENCOUNTER — Ambulatory Visit (INDEPENDENT_AMBULATORY_CARE_PROVIDER_SITE_OTHER)
Admission: RE | Admit: 2018-07-13 | Discharge: 2018-07-13 | Disposition: A | Discharge status: Home / Self Care | Payer: Self-pay | Source: Ambulatory Visit | Attending: Cardiology | Admitting: Cardiology

## 2018-07-13 ENCOUNTER — Ambulatory Visit (HOSPITAL_COMMUNITY): Payer: Commercial Managed Care - PPO | Attending: Internal Medicine

## 2018-07-13 DIAGNOSIS — R002 Palpitations: Secondary | ICD-10-CM | POA: Diagnosis not present

## 2018-07-13 DIAGNOSIS — R0609 Other forms of dyspnea: Secondary | ICD-10-CM | POA: Insufficient documentation

## 2018-07-13 DIAGNOSIS — R9439 Abnormal result of other cardiovascular function study: Secondary | ICD-10-CM

## 2018-07-13 DIAGNOSIS — R0789 Other chest pain: Secondary | ICD-10-CM | POA: Diagnosis not present

## 2018-07-13 DIAGNOSIS — R0600 Dyspnea, unspecified: Secondary | ICD-10-CM

## 2018-07-16 ENCOUNTER — Ambulatory Visit: Payer: Commercial Managed Care - PPO | Admitting: Cardiology

## 2018-07-16 ENCOUNTER — Encounter: Payer: Self-pay | Admitting: Cardiology

## 2018-07-16 VITALS — BP 98/64 | HR 87 | Ht 64.0 in | Wt 144.0 lb

## 2018-07-16 DIAGNOSIS — R0609 Other forms of dyspnea: Secondary | ICD-10-CM | POA: Diagnosis not present

## 2018-07-16 DIAGNOSIS — R0789 Other chest pain: Secondary | ICD-10-CM

## 2018-07-16 DIAGNOSIS — R0600 Dyspnea, unspecified: Secondary | ICD-10-CM

## 2018-07-16 DIAGNOSIS — R002 Palpitations: Secondary | ICD-10-CM

## 2018-07-16 DIAGNOSIS — M1380 Other specified arthritis, unspecified site: Secondary | ICD-10-CM

## 2018-07-16 NOTE — Progress Notes (Signed)
Cardiology Office Note:    Date:  07/16/2018   ID:  Miranda [name], DOB 16-Jan-1961, MRN [id number]  PCP:  Pearline Cables, MD  Cardiologist:  Gypsy Balsam, MD    Referring MD: Pearline Cables, MD   Chief Complaint  Patient presents with  . Follow up on Echo  Still having issues  History of Present Illness:    Miranda [name] is a 58 y.o. female who complain of having exertional shortness of breath as well as atypical chest pain.  She did have a stress test EKG was negative however she get blunted blood pressure response.  I did schedule her to have a calcium score which came 0 which make coronary artery disease very unlikely.  She also had echocardiogram done which basically was normal.  Obviously her symptoms are still quite concerning.  She does have some remote history of some rheumatological problem and she was tried different medication with limited success is but the more I talked to her the mild convinced that it may be the reason for her problems top of that I think it would be reasonable to refer her to pulmonary to see if she get any lung issues that could be responsible for his symptoms bronchial asthma is potential diagnosis that come to mind.  He does not have any wheezes but pulmonary function test will be beneficial. We will not change any of the cardiac management at the moment however I would like to see her back in about 3 months.  Also advised her to go to pulmonary as well as rheumatology.  Past Medical History:  Diagnosis Date  . Abnormal Pap smear of cervix 1996  . Allergy   . Allergy, unspecified not elsewhere classified   . IBS (irritable bowel syndrome)   . Idiopathic hypersomnia   . 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 CRYOTHERAPY    . SHOULDER SURGERY     right  . TONSILLECTOMY      Current Medications: Current Meds  Medication Sig  .  amphetamine-dextroamphetamine (ADDERALL) 10 MG tablet 1 or 2 daily if needed  . Armodafinil 250 MG tablet TAKE 1 TABLET BY MOUTH IN THE MORNING AND 1/2 TABLET AT NOON AS DIRECTED  . escitalopram (LEXAPRO) 20 MG tablet Take 1 tablet (20 mg total) by mouth daily.  Marland Kitchen estradiol (ESTRACE) 0.5 MG tablet Take 1 tablet (0.5 mg total) by mouth daily.  . hydrOXYzine (ATARAX/VISTARIL) 25 MG tablet Take 1 tablet (25 mg total) by mouth 3 (three) times daily as needed. Use for itching  . Ibuprofen (ADVIL PO) Take by mouth as needed. Reported on 06/27/2015  . Melatonin 3 MG TABS Take 5 mg by mouth at bedtime. Reported on 06/27/2015  . progesterone (PROMETRIUM) 100 MG capsule Take 1 capsule (100 mg total) by mouth daily.  Marland Kitchen triamcinolone cream (KENALOG) 0.1 % Apply 1 application topically 2 (two) times daily. Use on itchy areas  . zaleplon (SONATA) 5 MG capsule 1-2 caps as needed for sleep     Allergies:   Keflex [cephalexin]   Social History   Socioeconomic History  . Marital status: Single    Spouse name: Not on file  . Number of children: Not on file  . Years of education: Not on file  . Highest education level: Not on file  Occupational History  . Not on file  Social Needs  . Financial resource strain:  Not on file  . Food insecurity:    Worry: Not on file    Inability: Not on file  . Transportation needs:    Medical: Not on file    Non-medical: Not on file  Tobacco Use  . Smoking status: Never Smoker  . Smokeless tobacco: Never Used  Substance and Sexual Activity  . Alcohol use: No    Alcohol/week: 0.0 standard drinks  . Drug use: No  . Sexual activity: Yes    Partners: Female    Birth control/protection: None, Post-menopausal  Lifestyle  . Physical activity:    Days per week: Not on file    Minutes per session: Not on file  . Stress: Not on file  Relationships  . Social connections:    Talks on phone: Not on file    Gets together: Not on file    Attends religious service: Not on  file    Active member of club or organization: Not on file    Attends meetings of clubs or organizations: Not on file    Relationship status: Not on file  Other Topics Concern  . Not on file  Social History Narrative  . Not on file     Family History: The patient's family history includes Cancer in her mother; Heart attack in her mother; Heart disease in her father and mother. There is no history of Colon cancer. She was adopted. ROS:   Please see the history of present illness.    All 14 point review of systems negative except as described per history of present illness  EKGs/Labs/Other Studies Reviewed:      Recent Labs: 11/05/2017: ALT 14; TSH 2.01 05/27/2018: BUN 21; Creatinine, Ser 1.16; Hemoglobin 15.0; Platelets 201.0; Potassium 4.3; Sodium 138  Recent Lipid Panel    Component Value Date/Time   CHOL 223 (H) 11/05/2017 1651   TRIG 112.0 11/05/2017 1651   HDL 72.50 11/05/2017 1651   CHOLHDL 3 11/05/2017 1651   VLDL 22.4 11/05/2017 1651   LDLCALC 128 (H) 11/05/2017 1651    Physical Exam:    VS:  BP 98/64   Pulse 87   Ht 5\' 4"  (1.626 m)   Wt 144 lb (65.3 kg)   LMP 09/06/2012   SpO2 97%   BMI 24.72 kg/m     Wt Readings from Last 3 Encounters:  07/16/18 144 lb (65.3 kg)  06/25/18 143 lb 1.9 oz (64.9 kg)  05/27/18 141 lb (64 kg)     GEN:  Well nourished, well developed in no acute distress HEENT: Normal NECK: No JVD; No carotid bruits LYMPHATICS: No lymphadenopathy CARDIAC: RRR, no murmurs, no rubs, no gallops RESPIRATORY:  Clear to auscultation without rales, wheezing or rhonchi  ABDOMEN: Soft, non-tender, non-distended MUSCULOSKELETAL:  No edema; No deformity  SKIN: Warm and dry LOWER EXTREMITIES: no swelling NEUROLOGIC:  Alert and oriented x 3 PSYCHIATRIC:  Normal affect   ASSESSMENT:    1. Dyspnea on exertion   2. Atypical chest pain   3. Palpitations   4. Seronegative arthritis    PLAN:    In order of problems listed above:  1. Dyspnea on  exertion so far cardiac work-up negative will refer her to pulmonary. 2. Atypical chest pain stress test negative EKG, calcium score 0 3. Palpitations denies having any. 4. Arthritis recommended to see rheumatologist.   Medication Adjustments/Labs and Tests Ordered: Current medicines are reviewed at length with the patient today.  Concerns regarding medicines are outlined above.  No orders of  the defined types were placed in this encounter.  Medication changes: No orders of the defined types were placed in this encounter.   Signed, Georgeanna Leaobert J. Ericka Marcellus, MD, [facility] 07/16/2018 4:02 PM    [facility]

## 2018-07-16 NOTE — Patient Instructions (Signed)
Medication Instructions:  Your physician recommends that you continue on your current medications as directed. Please refer to the Current Medication list given to you today.  If you need a refill on your cardiac medications before your next appointment, please call your pharmacy.   Lab work: None.  If you have labs (blood work) drawn today and your tests are completely normal, you will receive your results only by: Marland Kitchen MyChart Message (if you have MyChart) OR . A paper copy in the mail If you have any lab test that is abnormal or we need to change your treatment, we will call you to review the results.  Testing/Procedures: Your physician has recommended that you have a pulmonary function test. Pulmonary Function Tests are a group of tests that measure how well air moves in and out of your lungs.    Follow-Up: At [facility], you and your health needs are our priority.  As part of our continuing mission to provide you with exceptional heart care, we have created designated Provider Care Teams.  These Care Teams include your primary Cardiologist (physician) and Advanced Practice Providers (APPs -  Physician Assistants and Nurse Practitioners) who all work together to provide you with the care you need, when you need it. You will need a follow up appointment in 3 months.  Please call our office 2 months in advance to schedule this appointment.  You may see No primary care provider on file. or another member of our [facility] Provider Team in [place]: Norman Herrlich, MD . Belva Crome, MD  Any Other Special Instructions Will Be Listed Below (If Applicable).  Dr. Bing Matter has referred you to see pulmonology, their office should call you within one week if not please call our office.  Pulmonary Function Tests Pulmonary function tests (PFTs) are used to measure how well your lungs work, find out what is causing your lung problems, and figure out the best treatment for you. You may have  PFTs:  When you have an illness involving the lungs.  To follow changes in your lung function over time if you have a chronic lung disease.  If you are an IT trainer. This checks the effects of being exposed to chemicals over a long period of time.  To check lung function before having surgery or other procedures.  To check your lungs if you smoke.  To check if prescribed medicines or treatments are helping your lungs. Your results will be compared to the expected lung function of someone with healthy lungs who is similar to you in:  Age.  Gender.  Height.  Weight.  Race or ethnicity. This is done to show how your lungs compare to normal lung function (percent predicted). This is how your health care provider knows if your lung function is normal or not. If you have had PFTs done before, your health care provider will compare your current results with past results. This shows if your lung function is better, worse, or the same as before. Tell a health care provider about:  Any allergies you have.  All medicines you are taking, including inhaler or nebulizer medicines, vitamins, herbs, eye drops, creams, and over-the-counter medicines.  Any blood disorders you have.  Any surgeries you have had, especially recent eye surgery, abdominal surgery, or chest surgery. These can make PFTs difficult or unsafe.  Any medical conditions you have, including chest pain or heart problems, tuberculosis, or respiratory infections such as pneumonia, a cold, or the flu.  Any fear of being in closed spaces (claustrophobia). Some of your tests may be in a closed space. What are the risks? Generally, this is a safe procedure. However, problems may occur, including:  Light-headedness due to over-breathing (hyperventilation).  An asthma attack from deep breathing.  A collapsed lung. What happens before the procedure?  Take over-the-counter and prescription medicines only as told  by your health care provider. If you take inhaler or nebulizer medicines, ask your health care provider which medicines you should take on the day of your testing. Some inhaler medicines may interfere with PFTs if they are taken shortly before the tests.  Follow your health care provider's instructions on eating and drinking restrictions. This may include avoiding eating large meals and drinking alcohol before the testing.  Do not use any products that contain nicotine or tobacco, such as cigarettes and e-cigarettes. If you need help quitting, ask your health care provider.  Wear comfortable clothing that will not interfere with breathing. What happens during the procedure?   You will be given a soft nose clip to wear. This is done so all of your breaths will go through your mouth instead of your nose.  You will be given a germ-free (sterile) mouthpiece. It will be attached to a machine that measures your breathing (spirometer).  You will be asked to do various breathing maneuvers. The maneuvers will be done by breathing in (inhaling) and breathing out (exhaling). You may be asked to repeat the maneuvers several times before the testing is done.  It is important to follow the instructions exactly to get accurate results. Make sure to blow as hard and as fast as you can when you are told to do so.  You may be given a medicine that makes the small air passages in your lungs larger (bronchodilator) after testing has been done. This medicine will make it easier for you to breathe.  The tests will be repeated after the bronchodilator has taken effect.  You will be monitored carefully during the procedure for faintness, dizziness, trouble breathing, or any other problems. The procedure may vary among health care providers and hospitals. What happens after the procedure?  It is up to you to get your test results. Ask your health care provider, or the department that is doing the tests, when your  results will be ready. After you have received your test results, talk with your health care provider about treatment options, if necessary. Summary  Pulmonary function tests (PFTs) are used to measure how well your lungs work, find out what is causing your lung problems, and figure out the best treatment for you.  Wear comfortable clothing that will not interfere with breathing.  It is up to you to get your test results. After you have received them, talk with your health care provider about treatment options, if necessary. This information is not intended to replace advice given to you by your health care provider. Make sure you discuss any questions you have with your health care provider. Document Released: 01/03/2004 Document Revised: 04/03/2016 Document Reviewed: 04/03/2016 Elsevier Interactive Patient Education  2019 [employer].

## 2018-07-23 ENCOUNTER — Ambulatory Visit (HOSPITAL_COMMUNITY)
Admission: RE | Admit: 2018-07-23 | Discharge: 2018-07-23 | Disposition: A | Discharge status: Home / Self Care | Payer: Commercial Managed Care - PPO | Source: Ambulatory Visit | Attending: Cardiology | Admitting: Cardiology

## 2018-07-23 DIAGNOSIS — R0609 Other forms of dyspnea: Secondary | ICD-10-CM | POA: Insufficient documentation

## 2018-07-23 LAB — PULMONARY FUNCTION TEST
DL/VA % pred: 92 %
DL/VA: 3.89 ml/min/mmHg/L
DLCO cor % pred: 87 %
DLCO cor: 17.83 ml/min/mmHg
DLCO unc % pred: 91 %
DLCO unc: 18.75 ml/min/mmHg
FEF 25-75 Post: 2.12 L/sec
FEF 25-75 Pre: 1.74 L/sec
FEF2575-%Change-Post: 21 %
FEF2575-%Pred-Post: 87 %
FEF2575-%Pred-Pre: 71 %
FEV1-%Change-Post: 4 %
FEV1-%Pred-Post: 108 %
FEV1-%Pred-Pre: 104 %
FEV1-Post: 2.83 L
FEV1-Pre: 2.72 L
FEV1FVC-%Change-Post: 9 %
FEV1FVC-%Pred-Pre: 90 %
FEV6-%Change-Post: -2 %
FEV6-%PRED-PRE: 113 %
FEV6-%Pred-Post: 111 %
FEV6-POST: 3.61 L
FEV6-Pre: 3.7 L
FEV6FVC-%Change-Post: 2 %
FEV6FVC-%PRED-PRE: 99 %
FEV6FVC-%Pred-Post: 102 %
FVC-%CHANGE-POST: -4 %
FVC-%Pred-Post: 108 %
FVC-%Pred-Pre: 113 %
FVC-Post: 3.65 L
FVC-Pre: 3.83 L
Post FEV1/FVC ratio: 78 %
Post FEV6/FVC ratio: 99 %
Pre FEV1/FVC ratio: 71 %
Pre FEV6/FVC Ratio: 97 %
RV % pred: 125 %
RV: 2.45 L
TLC % pred: 120 %
TLC: 6.08 L

## 2018-07-23 LAB — BLOOD GAS, ARTERIAL
Acid-Base Excess: 1.9 mmol/L (ref 0.0–2.0)
Bicarbonate: 25.8 mmol/L (ref 20.0–28.0)
Drawn by: 211791
FIO2: 21
O2 Saturation: 96.4 %
PCO2 ART: 39.4 mmHg (ref 32.0–48.0)
PH ART: 7.432 (ref 7.350–7.450)
Patient temperature: 98.6
pO2, Arterial: 82.4 mmHg — ABNORMAL LOW (ref 83.0–108.0)

## 2018-07-23 MED ORDER — ALBUTEROL SULFATE (2.5 MG/3ML) 0.083% IN NEBU
2.5000 mg | INHALATION_SOLUTION | Freq: Once | RESPIRATORY_TRACT | Status: AC
  Administered 2018-07-23: 2.5 mg via RESPIRATORY_TRACT

## 2018-07-23 NOTE — Addendum Note (Signed)
Addended by: Lita Mains on: 07/23/2018 08:14 AM   Modules accepted: Orders

## 2018-07-26 ENCOUNTER — Telehealth: Payer: Self-pay

## 2018-07-26 NOTE — Telephone Encounter (Signed)
Patient called and notified of test results. 

## 2018-07-26 NOTE — Telephone Encounter (Signed)
-----   Message from Georgeanna Lea, MD sent at 07/26/2018  3:03 PM EST ----- Pulmonary function test looks quite good: Minimal Obstructive Airways Disease Overinflation and airtrapping Insignificant response to bronchodilator Normal Diffusion  My understanding is that she has follow-up with pulmonary

## 2018-07-31 ENCOUNTER — Other Ambulatory Visit: Payer: Self-pay | Admitting: Family Medicine

## 2018-08-04 ENCOUNTER — Other Ambulatory Visit: Payer: Self-pay | Admitting: Internal Medicine

## 2018-08-05 ENCOUNTER — Other Ambulatory Visit: Payer: Self-pay

## 2018-08-05 MED ORDER — AMPHETAMINE-DEXTROAMPHETAMINE 10 MG PO TABS: ORAL_TABLET | ORAL | 0 refills | Status: DC

## 2018-08-05 NOTE — Telephone Encounter (Signed)
Adderall refill e-sent 

## 2018-08-05 NOTE — Telephone Encounter (Signed)
[facility]. A new PA is required for patient to receive Armodafinil. [facility] is faxing PA form to be started for Patient medication.  Fax number confirmed [phone].

## 2018-08-05 NOTE — Telephone Encounter (Signed)
CY please advise on refill. Thanks. 

## 2018-08-06 ENCOUNTER — Telehealth: Payer: Self-pay | Admitting: Internal Medicine

## 2018-08-06 NOTE — Telephone Encounter (Signed)
Medication name and strength:Armodafinil 250mg  Provider: Dr Maple Hudson Pharmacy: [facility]   Was the PA started on [facility]?  yes If yes, please enter the [id number]  [facility]: [id number] - Rx #: [id number] Need help? Call us at [phone]  Status  Sent to Plan today 08/06/18 DrugArmodafinil 250MG  tablets  FormExpress Scripts Electronic PA Form  Original Claim Info75 CALL HELP DESK  Routed to [employer] for future follow up

## 2018-08-11 DIAGNOSIS — J019 Acute sinusitis, unspecified: Secondary | ICD-10-CM | POA: Diagnosis not present

## 2018-08-11 NOTE — Telephone Encounter (Signed)
Followed up on PA for ARmodafinil 250mg  [id number] - PA Case ID: [id number] - Rx #: [id number]  Had to complete few additional questions on this pt and medication The plan has all info needed for a decision on this rx Awaiting determination; can take 7-10 business days  Routing message to [facility] box for review.

## 2018-08-18 NOTE — Telephone Encounter (Signed)
Approvedon March 18  CaseId:[id number];Status:Approved;Review Type:Prior Auth; Coverage Start Date:07/12/2018;Coverage End Date:08/11/2019; Pt is aware of the approval advised rx has been filled Nothing further needed

## 2018-09-23 ENCOUNTER — Ambulatory Visit: Payer: Commercial Managed Care - PPO | Admitting: Internal Medicine

## 2018-10-01 ENCOUNTER — Telehealth (INDEPENDENT_AMBULATORY_CARE_PROVIDER_SITE_OTHER): Payer: Commercial Managed Care - PPO | Admitting: Cardiology

## 2018-10-01 ENCOUNTER — Encounter: Payer: Self-pay | Admitting: Cardiology

## 2018-10-01 ENCOUNTER — Other Ambulatory Visit: Payer: Self-pay

## 2018-10-01 DIAGNOSIS — R002 Palpitations: Secondary | ICD-10-CM

## 2018-10-01 DIAGNOSIS — M359 Systemic involvement of connective tissue, unspecified: Secondary | ICD-10-CM

## 2018-10-01 DIAGNOSIS — R0609 Other forms of dyspnea: Secondary | ICD-10-CM

## 2018-10-01 DIAGNOSIS — R0600 Dyspnea, unspecified: Secondary | ICD-10-CM

## 2018-10-01 DIAGNOSIS — R0789 Other chest pain: Secondary | ICD-10-CM | POA: Diagnosis not present

## 2018-10-01 NOTE — Progress Notes (Signed)
Virtual Visit via Video Note   This visit type was conducted due to national recommendations for restrictions regarding the COVID-19 Pandemic (e.g. social distancing) in an effort to limit this patient's exposure and mitigate transmission in our community.  Due to her co-morbid illnesses, this patient is at least at moderate risk for complications without adequate follow up.  This format is felt to be most appropriate for this patient at this time.  All issues noted in this document were discussed and addressed.  A limited physical exam was performed with this format.  Please refer to the patient's chart for her consent to telehealth for Miranda [name].  Evaluation Performed:  Follow-up visit  This visit type was conducted due to national recommendations for restrictions regarding the COVID-19 Pandemic (e.g. social distancing).  This format is felt to be most appropriate for this patient at this time.  All issues noted in this document were discussed and addressed.  No physical exam was performed (except for noted visual exam findings with Video Visits).  Please refer to the patient's chart (MyChart message for video visits and phone note for telephone visits) for the patient's consent to telehealth for Miranda [name].  Date:  10/01/2018  ID: Miranda [name], DOB 23-Oct-1960, MRN [id number]   Patient Location: [place] [address]   Provider location:   [place] Office  PCP:  Miranda Mclean, MD  Cardiologist:  Miranda Campus, MD     Chief Complaint: Doing well  History of Present Illness:    Miranda [name] is a 58 y.o. female  who presents via audio/video conferencing for a telehealth visit today.  Doing better.  I met her initially because of shortness of breath as well as atypical chest pain.  She did have a stress test done got somewhat blunted response of her blood pressure after that calcium score has been done which was 0 overall she  reports feeling bad about all the same time she reports being tired and fatigued.  She used to exercise on the regular basis but does not do it anymore.  She says she is so tired she cannot do that she does have difficulty getting up in the morning in the matter-of-fact first time ever she overslept and was light at work few days ago.  Previously we talked about potentially following up with pulmonary I did pulmonary function testing test on her which came back normal.  She is scheduled to see them however visit has been postponed because of coronavirus situation.  I think she can also benefit from seeing a rheumatologist in the same situation happened it was postponed because of coronavirus.  Overall she tells me she is doing well denies having any chest pain.   The patient does not have symptoms concerning for COVID-19 infection (fever, chills, cough, or new SHORTNESS OF BREATH).    Prior CV studies:   The following studies were reviewed today:       Past Medical History:  Diagnosis Date  . Abnormal Pap smear of cervix 1996  . Allergy   . Allergy, unspecified not elsewhere classified   . IBS (irritable bowel syndrome)   . Idiopathic hypersomnia   . 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 CRYOTHERAPY    . SHOULDER SURGERY     right  . TONSILLECTOMY       Current Meds  Medication Sig  .  amphetamine-dextroamphetamine (ADDERALL) 10 MG tablet 1 or 2 daily if needed  . Armodafinil 250 MG tablet TAKE 1 TABLET BY MOUTH IN THE MORNING AND 1/2 TABLET AT NOON AS DIRECTED  . escitalopram (LEXAPRO) 20 MG tablet TAKE 1 TABLET(20 MG) BY MOUTH DAILY  . estradiol (ESTRACE) 0.5 MG tablet Take 1 tablet (0.5 mg total) by mouth daily.  . Ibuprofen (ADVIL PO) Take by mouth as needed. Reported on 06/27/2015  . Melatonin 3 MG TABS Take 5 mg by mouth at bedtime. Reported on 06/27/2015  . progesterone (PROMETRIUM) 100 MG capsule Take 1  capsule (100 mg total) by mouth daily.  Marland Kitchen triamcinolone cream (KENALOG) 0.1 % Apply 1 application topically 2 (two) times daily. Use on itchy areas      Family History: The patient's family history includes Cancer in her mother; Heart attack in her mother; Heart disease in her father and mother. There is no history of Colon cancer. She was adopted.   ROS:   Please see the history of present illness.     All other systems reviewed and are negative.   Labs/Other Tests and Data Reviewed:     Recent Labs: 11/05/2017: ALT 14; TSH 2.01 05/27/2018: BUN 21; Creatinine, Ser 1.16; Hemoglobin 15.0; Platelets 201.0; Potassium 4.3; Sodium 138  Recent Lipid Panel    Component Value Date/Time   CHOL 223 (H) 11/05/2017 1651   TRIG 112.0 11/05/2017 1651   HDL 72.50 11/05/2017 1651   CHOLHDL 3 11/05/2017 1651   VLDL 22.4 11/05/2017 1651   LDLCALC 128 (H) 11/05/2017 1651      Exam:    Vital Signs:  LMP 09/06/2012     Wt Readings from Last 3 Encounters:  07/16/18 144 lb (65.3 kg)  06/25/18 143 lb 1.9 oz (64.9 kg)  05/27/18 141 lb (64 kg)     Well nourished, well developed in no acute distress. 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  Diagnosis for this visit:   1. Atypical chest pain   2. Undifferentiated connective tissue disease (HCC)   3. Palpitations   4. Dyspnea on exertion      ASSESSMENT & PLAN:    1.  Atypical chest pain doing well from that point review we will continue present management.  Stress test showed blunted blood pressure response no evidence of ischemia calcium score is 0. 2.  I am identified connective tissue disease I recommended to follow-up with rheumatologist. 3.  Palpitations denies having any. 4.  Dyspnea on exertion.  Doing well  from that point of view.  We discussed the need to exercise on the regular basis as well as good diet  COVID-19 Education: The signs and symptoms of COVID-19 were discussed with the patient and how to seek care for testing (follow up with PCP or arrange E-visit).  The importance of social distancing was discussed today.  Patient Risk:   After full review of this patients clinical status, I feel that they are at least moderate risk at this time.  Time:   Today, I have spent 15 minutes with the patient with telehealth technology discussing pt health issues.  I spent 5 minutes reviewing her chart before the visit.  Visit was finished at 8:32 AM.    Medication  Adjustments/Labs and Tests Ordered: Current medicines are reviewed at length with the patient today.  Concerns regarding medicines are outlined above.  No orders of the defined types were placed in this encounter.  Medication changes: No orders of the defined types were placed in this encounter.    Disposition: Follow-up in 6 months  Signed, Park Liter, MD, [facility] [name] 10/01/2018 8:34 AM    [place]

## 2018-10-01 NOTE — Patient Instructions (Signed)
Medication Instructions:  Your physician recommends that you continue on your current medications as directed. Please refer to the Current Medication list given to you today.  If you need a refill on your cardiac medications before your next appointment, please call your pharmacy.   Lab work: None If you have labs (blood work) drawn today and your tests are completely normal, you will receive your results only by: . MyChart Message (if you have MyChart) OR . A paper copy in the mail If you have any lab test that is abnormal or we need to change your treatment, we will call you to review the results.  Testing/Procedures: None  Follow-Up: At [facility], you and your health needs are our priority.  As part of our continuing mission to provide you with exceptional heart care, we have created designated Provider Care Teams.  These Care Teams include your primary Cardiologist (physician) and Advanced Practice Providers (APPs -  Physician Assistants and Nurse Practitioners) who all work together to provide you with the care you need, when you need it. You will need a follow up appointment in 6 months. Any Other Special Instructions Will Be Listed Below (If Applicable).    

## 2018-10-03 ENCOUNTER — Other Ambulatory Visit: Payer: Self-pay | Admitting: Certified Nurse Midwife

## 2018-10-03 DIAGNOSIS — N951 Menopausal and female climacteric states: Secondary | ICD-10-CM

## 2018-10-04 NOTE — Telephone Encounter (Signed)
Medication refill request: Estradiol Last AEX:  09/30/17 DL  Next AEX: none scheduled Last MMG (if hormonal medication request): 11/12/17 BIRADS 1 negative/density c Refill authorized: Order pended #30 w/0 refills if authorized. Please advise

## 2018-10-07 ENCOUNTER — Ambulatory Visit: Payer: Commercial Managed Care - PPO | Admitting: Certified Nurse Midwife

## 2018-10-14 ENCOUNTER — Other Ambulatory Visit: Payer: Self-pay | Admitting: Certified Nurse Midwife

## 2018-10-14 ENCOUNTER — Other Ambulatory Visit: Payer: Self-pay | Admitting: Internal Medicine

## 2018-10-14 DIAGNOSIS — N951 Menopausal and female climacteric states: Secondary | ICD-10-CM

## 2018-10-15 NOTE — Telephone Encounter (Signed)
Medication refill request: prometrium 100mg  Last AEX:  09-30-17 Next AEX: cancelled due to covid, left message for patient to schedule exam Last MMG (if hormonal medication request): 11-12-17 category c density birads 1:neg Refill authorized: please approve if appropriate. Message also placed in pharmacy notes for patient to call for appt.

## 2018-10-24 ENCOUNTER — Other Ambulatory Visit: Payer: Self-pay | Admitting: Internal Medicine

## 2018-11-01 ENCOUNTER — Telehealth: Payer: Self-pay | Admitting: Internal Medicine

## 2018-11-01 MED ORDER — ARMODAFINIL 250 MG PO TABS: ORAL_TABLET | ORAL | 5 refills | Status: DC

## 2018-11-01 NOTE — Telephone Encounter (Signed)
Armodafinil resent as requested

## 2018-11-01 NOTE — Telephone Encounter (Signed)
armodafinil 250mg  Rx was sent to pharmacy for pt 10/25/2018 but it looks like the status was not changed from print to normal to have it electronically sent to pharmacy for pt.  Dr. Annamaria Boots, please advise if you can resend the Rx for pt to pharmacy with the status being changed from print to normal. Pt's pharmacy this needs to be sent to is [facility] on [address]. Thanks!

## 2018-11-03 NOTE — Telephone Encounter (Signed)
Please tell patient I recommend she self-pay for the additional 10 days per month that her insurance is unwilling to cover.

## 2018-11-03 NOTE — Telephone Encounter (Signed)
Called [facility] and confirmed the prescription was received. Pharmacist stated the patient's insurance will not allow a prescription to process with a 45 tablet dispense. Patient would only be able to get a 20 day supply at a time, which did clear the insurance. He stated the refills would still remain as 5, but she would only get 20 day supply each time.  Message routed to Dr. Annamaria Boots as Juluis Rainier.  Dr. Annamaria Boots please see above, forwarded to make you aware. This will end up being refilled sooner because of the limited number of tablets the patient can get at a time because of her insurance. Thank you.

## 2018-11-04 NOTE — Telephone Encounter (Signed)
ATC pt to make aware of CY recommendations. LMTCB

## 2018-11-08 ENCOUNTER — Other Ambulatory Visit: Payer: Self-pay | Admitting: *Deleted

## 2018-11-08 DIAGNOSIS — N951 Menopausal and female climacteric states: Secondary | ICD-10-CM

## 2018-11-08 MED ORDER — ESTRADIOL 0.5 MG PO TABS: 0.5000 mg | ORAL_TABLET | Freq: Every day | ORAL | 0 refills | Status: DC

## 2018-11-08 NOTE — Telephone Encounter (Signed)
Pt is returning call. Cb is [phone].

## 2018-11-08 NOTE — Telephone Encounter (Signed)
Left message for patient to call back  

## 2018-11-08 NOTE — Telephone Encounter (Signed)
Medication refill request: Estradiol  Last AEX:  09-30-17 DL  Next AEX: 12-20-2018 Last MMG (if hormonal medication request): 11-12-17 density C/BIRADS 1 negative Refill authorized: Today, please advise.   Medication pended for #90, 0RF. Please refill if appropriate. Pharmacy confirmed as [facility] [address].

## 2018-11-08 NOTE — Telephone Encounter (Signed)
Called and spoke with pt. Stated to pt that CY did send in Rx for 45tabs of the armodafinil but stated to her that insurance will only allow 20-day supply of the medication and due to this, CY recommended she self-pay for the remainder of the Rx that insurance would not cover.  Pt stated that she would talk with pharmacy and see how much it would cost for her to self-pay for the remainder of the Rx that insurance will not cover. I stated to pt if the cost was too much to call us back and we could then discuss this with CY. Nothing further needed.

## 2018-11-19 ENCOUNTER — Other Ambulatory Visit: Payer: Self-pay | Admitting: Obstetrics and Gynecology

## 2018-11-19 DIAGNOSIS — N951 Menopausal and female climacteric states: Secondary | ICD-10-CM

## 2018-11-19 NOTE — Telephone Encounter (Signed)
Medication refill request: Progesterone 100mg  #30, NR Last AEX:  11-12-17 Next AEX: 12-20-18 Last MMG (if hormonal medication request): 11-12-17 EXM:[id number] Refill authorized: Please refill if appropriate

## 2018-11-22 ENCOUNTER — Telehealth: Payer: Self-pay | Admitting: Certified Nurse Midwife

## 2018-11-22 ENCOUNTER — Other Ambulatory Visit: Payer: Self-pay | Admitting: *Deleted

## 2018-11-22 ENCOUNTER — Other Ambulatory Visit: Payer: Self-pay

## 2018-11-22 ENCOUNTER — Other Ambulatory Visit: Payer: Self-pay | Admitting: Family Medicine

## 2018-11-22 ENCOUNTER — Ambulatory Visit
Admission: RE | Admit: 2018-11-22 | Discharge: 2018-11-22 | Disposition: A | Discharge status: Home / Self Care | Payer: Commercial Managed Care - PPO | Source: Ambulatory Visit

## 2018-11-22 DIAGNOSIS — Z1231 Encounter for screening mammogram for malignant neoplasm of breast: Secondary | ICD-10-CM

## 2018-11-22 NOTE — Telephone Encounter (Signed)
Needs current mammogram or scheduled for refill after this one

## 2018-11-22 NOTE — Telephone Encounter (Signed)
Spoke with patient. She went for screening MMG today and stated she had Lt.breast lump under left breast/under arm and possibly a Rt.breast lump. TBC would not do screening--advised patient their reasoning. Patient felt both lumps were getting smaller. Made appt.for breast check with Melvia Heaps, CNM on 11-24-18 at 9:00am.

## 2018-11-22 NOTE — Telephone Encounter (Signed)
Patient sent the following correspondence through [place]. Routing to triage to assist patient with request.  Good afternoon,  I qent for my mammogram today and because I have a lump under my arm that is new since last visit, they recommended I consult with you. The technician said it would have to be a diagnostic appointment not a screening. The lump is on the left side, is tender and has been there about 3 weeks. It has gotten a little smaller and my beast on the same side is also tender. Let me know what if anything I need to do.    Thank you   Miranda [name]

## 2018-11-22 NOTE — Telephone Encounter (Signed)
Opened in error

## 2018-11-23 NOTE — Telephone Encounter (Signed)
Call to patient, left detailed message, ok per dpr, name identified on voicemail. Advised as seen below per Melvia Heaps, CNM. Rx for progesterone #30/0RF has been sent to [facility]. Return call to office if any additional questions. Encounter closed.

## 2018-11-24 ENCOUNTER — Ambulatory Visit: Payer: Commercial Managed Care - PPO | Admitting: Certified Nurse Midwife

## 2018-11-24 ENCOUNTER — Other Ambulatory Visit: Payer: Self-pay

## 2018-11-24 ENCOUNTER — Encounter: Payer: Self-pay | Admitting: Certified Nurse Midwife

## 2018-11-24 VITALS — BP 88/60 | HR 64 | Temp 97.6°F | Resp 16 | Wt 144.0 lb

## 2018-11-24 DIAGNOSIS — N644 Mastodynia: Secondary | ICD-10-CM

## 2018-11-24 DIAGNOSIS — R599 Enlarged lymph nodes, unspecified: Secondary | ICD-10-CM

## 2018-11-24 NOTE — Progress Notes (Signed)
   Subjective:   58 y.o. Married Caucasian female presents for evaluation of left breast tenderness at outer edge and in axilla that she noted about 3 weeks ago. Had cut finger on left hand and feels infection was part of the issues with the breast with possible tender lymph . Patient sought evaluation because of breast tenderness and and had scheduled mammogram and could not be done without office evaluation.. Patient felt larger lump underarm initially, but slowly decreasing along with tenderness. Denies nipple discharge or redness or itching. Contributing factors include: possible infection from hand, no family history of breast cancer. Patient is menopausal on HRT. Marland Kitchen Denies chills, fevers, malaise and but felt she had a fever with injury 3 weeks ago.Last mammogram was 11/12/17.     Review of Systems Pertinent items noted in HPI and remainder of comprehensive ROS otherwise negative.   Objective:   General appearance: alert, cooperative, appears stated age, no distress and healthy appearance Breasts: normal appearance and exam on right. Left breast tenderness in outer edge of breast to palpation. No nipple discharge, redness or skin change. Axillary area tender and lymph nodes enlarged,, no redness or increased warmth noted. No rash or change in appearance.    Physical Exam Chest:       Assessment:   ASSESSMENT:Patient is diagnosed with Left breast tenderness outer quadrant and enlarged axillary lymph nodes on left only. Afebrile History of injury to finger with infection and fever, healed, possible source of lymph node enlargement. Menopausal on HRT   Plan:   PLAN: Discussed finding with patient, and need for further evaluation with diagnostic mammogram and [place]. Discussed possible source was injury that is resolving. Patient agreeable to above. She is due for regular screening now.

## 2018-11-25 ENCOUNTER — Telehealth: Payer: Self-pay | Admitting: Certified Nurse Midwife

## 2018-11-25 NOTE — Telephone Encounter (Signed)
Patient sent the following message through [place]. Routing to triage to assist patient with request.   After my visit yesterday, I was supposed to get a call regarding a scheduled diagnostic appointment with [facility]. Following up as a reminder.      Thank you

## 2018-11-25 NOTE — Telephone Encounter (Signed)
Routing to Melvia Heaps, CNM to verify order.

## 2018-11-27 NOTE — Telephone Encounter (Signed)
Note was sent to triage

## 2018-11-27 NOTE — Telephone Encounter (Signed)
Chart note was forwarded to triage at end of visit for diagnostic mammogram of left axilla and breast. Patient had regular mammogram scheduled and aware she needed diagnostic for evaluation

## 2018-11-29 ENCOUNTER — Other Ambulatory Visit: Payer: Self-pay | Admitting: Certified Nurse Midwife

## 2018-11-29 DIAGNOSIS — N644 Mastodynia: Secondary | ICD-10-CM

## 2018-11-29 NOTE — Telephone Encounter (Signed)
Spoke with Joaquim Lai at [facility] and scheduled Bil.DX MMG w/left axillary on 12-06-18 8:50am.Called patient and notified of date/time of DX MMG.  Routed to provider to sign MMG order.

## 2018-12-06 ENCOUNTER — Ambulatory Visit
Admission: RE | Admit: 2018-12-06 | Discharge: 2018-12-06 | Disposition: A | Discharge status: Home / Self Care | Payer: Commercial Managed Care - PPO | Source: Ambulatory Visit | Attending: Certified Nurse Midwife | Admitting: Certified Nurse Midwife

## 2018-12-06 ENCOUNTER — Other Ambulatory Visit: Payer: Self-pay

## 2018-12-06 ENCOUNTER — Other Ambulatory Visit: Payer: Self-pay | Admitting: Certified Nurse Midwife

## 2018-12-06 DIAGNOSIS — N644 Mastodynia: Secondary | ICD-10-CM

## 2018-12-06 DIAGNOSIS — R599 Enlarged lymph nodes, unspecified: Secondary | ICD-10-CM

## 2018-12-08 NOTE — Telephone Encounter (Signed)
Dx MMG completed on 12/06/18/.  Encounter closed.

## 2018-12-11 ENCOUNTER — Other Ambulatory Visit: Payer: Self-pay | Admitting: Critical Care Medicine

## 2018-12-11 DIAGNOSIS — Z20822 Contact with and (suspected) exposure to covid-19: Secondary | ICD-10-CM

## 2018-12-15 LAB — NOVEL CORONAVIRUS, NAA: SARS-CoV-2, NAA: NOT DETECTED

## 2018-12-16 ENCOUNTER — Telehealth: Payer: Self-pay | Admitting: Certified Nurse Midwife

## 2018-12-16 NOTE — Telephone Encounter (Signed)
Spoke with patient. Patient reports lump in right axilla that she noticed 2 days ago. Has also been running intermittent low grade temp, 99.3 -99.6. Was seen in office on 11/24/18 for left breast tenderness and sent for dx MMG & [place], she has a f/u US of left axilla scheduled for 01/11/19 at [facility].   Patient states her whole family was tested for [place] on 7/18, negative.  Denies any other symptoms, remaining Covid19 prescreen negative.   OV scheduled for 7/24 at 11:30am with Melvia Heaps, CNM. Advised I will need to review with covering provider and return call if any additional recommendations, patient agreeable.   Dr. Talbert Nan-  Please review and advise.  Cc: Melvia Heaps, CNM

## 2018-12-16 NOTE — Telephone Encounter (Signed)
Patient sent the following message through [place]. Routing to triage to assist patient with request.  Good morning,  At my previous visit at the [facility], Dr. Theda Sers stated if there were any changes I noticed before my 8/18 re-check to notify I assume OBGYN so I can get in sooner. I have noticed a lump under my right armpit in what seems to be the same area as the left side.     Let me know if I am supposed to contact Dr. Theda Sers office direcctly.    Thank you,  Miranda [name]

## 2018-12-17 ENCOUNTER — Other Ambulatory Visit: Payer: Self-pay

## 2018-12-17 ENCOUNTER — Encounter: Payer: Self-pay | Admitting: Certified Nurse Midwife

## 2018-12-17 ENCOUNTER — Ambulatory Visit: Payer: Commercial Managed Care - PPO | Admitting: Certified Nurse Midwife

## 2018-12-17 ENCOUNTER — Telehealth: Payer: Self-pay | Admitting: *Deleted

## 2018-12-17 VITALS — BP 110/60 | HR 64 | Temp 97.3°F | Resp 16 | Wt 143.0 lb

## 2018-12-17 DIAGNOSIS — R590 Localized enlarged lymph nodes: Secondary | ICD-10-CM

## 2018-12-17 DIAGNOSIS — Z0000 Encounter for general adult medical examination without abnormal findings: Secondary | ICD-10-CM | POA: Diagnosis not present

## 2018-12-17 DIAGNOSIS — R599 Enlarged lymph nodes, unspecified: Secondary | ICD-10-CM

## 2018-12-17 NOTE — Telephone Encounter (Signed)
Patient seen in office today for enlarged right axillary lymph node, breast imaging recommended by Melvia Heaps, CNM.   12/06/18 Dx bilateral MMG and US of the left breast. Left breast US recommended, patient is scheduled for f/u US of left axilla on 01/11/19.   Call placed to [facility], spoke with [facility]. Lilia Pro to return call with imaging recommendations given patient had bilateral imaging on 12/06/18 and is scheduled for f/u left breast US of axilla.

## 2018-12-17 NOTE — Progress Notes (Deleted)
58 y.o. G0P0000 Married  {Race/ethnicity:17218} Fe here for annual exam.    Patient's last menstrual period was 09/06/2012.          Sexually active: {yes no:314532}  The current method of family planning is post menopausal status.    Exercising: {yes no:314532}  {types:19826} Smoker:  {YES NO:22349}  ROS  Health Maintenance: Pap:  09-25-16 neg HPV HR neg History of Abnormal Pap: yes MMG:  11/2018 bilateral & left breast u/s birads 3: prob benign Self Breast exams: {YES NO:22349} Colonoscopy:  2016 neg f/u 18yrs BMD:   none TDaP:  2010 Shingles: no Pneumonia: no Hep C and HIV:  Hep c neg 2018 Labs: ***   reports that she has never smoked. She has never used smokeless tobacco. She reports that she does not drink alcohol or use drugs.  Past Medical History:  Diagnosis Date  . Abnormal Pap smear of cervix 1996  . Allergy   . Allergy, unspecified not elsewhere classified   . IBS (irritable bowel syndrome)   . Idiopathic hypersomnia   . 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 CRYOTHERAPY    . SHOULDER SURGERY     right  . TONSILLECTOMY      Current Outpatient Medications  Medication Sig Dispense Refill  . amphetamine-dextroamphetamine (ADDERALL) 10 MG tablet 1 or 2 daily if needed 30 tablet 0  . Armodafinil 250 MG tablet 1 in AM and 1/2 tab at noon 45 tablet 5  . escitalopram (LEXAPRO) 20 MG tablet TAKE 1 TABLET(20 MG) BY MOUTH DAILY 30 tablet 5  . estradiol (ESTRACE) 0.5 MG tablet Take 1 tablet (0.5 mg total) by mouth daily. 90 tablet 0  . Ibuprofen (ADVIL PO) Take by mouth as needed. Reported on 06/27/2015    . progesterone (PROMETRIUM) 100 MG capsule TAKE 1 CAPSULE(100 MG) BY MOUTH DAILY.CALL TO SCHEDULE YEARLY EXAM FOR FUTURE REFILLS 30 capsule 0   No current facility-administered medications for this visit.     Family History  Adopted: Yes  Problem Relation Age of Onset  . Cancer Mother    stomach  . Heart disease Mother   . Heart attack Mother   . Heart disease Father   . Colon cancer Neg Hx     ROS:  Pertinent items are noted in HPI.  Otherwise, a comprehensive ROS was negative.  Exam:   LMP 09/06/2012    Ht Readings from Last 3 Encounters:  07/16/18 5\' 4"  (1.626 m)  06/25/18 5\' 4"  (1.626 m)  05/27/18 5\' 4"  (1.626 m)    General appearance: alert, cooperative and appears stated age Head: Normocephalic, without obvious abnormality, atraumatic Neck: no adenopathy, supple, symmetrical, trachea midline and thyroid {EXAM; THYROID:18604} Lungs: clear to auscultation bilaterally Breasts: {Exam; breast:13139::"normal appearance, no masses or tenderness"} Heart: regular rate and rhythm Abdomen: soft, non-tender; no masses,  no organomegaly Extremities: extremities normal, atraumatic, no cyanosis or edema Skin: Skin color, texture, turgor normal. No rashes or lesions Lymph nodes: Cervical, supraclavicular, and axillary nodes normal. No abnormal inguinal nodes palpated Neurologic: Grossly normal   Pelvic: External genitalia:  no lesions              Urethra:  normal appearing urethra with no masses, tenderness or lesions              Bartholin's and Skene's: normal  Vagina: normal appearing vagina with normal color and discharge, no lesions              Cervix: {exam; cervix:14595}              Pap taken: {yes no:314532} Bimanual Exam:  Uterus:  {exam; uterus:12215}              Adnexa: {exam; adnexa:12223}               Rectovaginal: Confirms               Anus:  normal sphincter tone, no lesions  Chaperone present: ***  A:  Well Woman with normal exam  P:   Reviewed health and wellness pertinent to exam  Pap smear: {YES NO:22349}  {plan; gyn:5269::"mammogram","pap smear","return annually or prn"}  An After Visit Summary was printed and given to the patient.

## 2018-12-17 NOTE — Telephone Encounter (Signed)
-----   Message from Regina Eck, CNM sent at 12/17/2018 12:37 PM EDT ----- Please schedule bilateral axillary [place] and diagnostic mammogram if indicated

## 2018-12-17 NOTE — Telephone Encounter (Signed)
Per review of Epic, patient is scheduled for right breast Dx MMG and [place] of axilla to follow on 12/23/18.   Routing to provider for final review. Patient is agreeable to disposition. Will close encounter.

## 2018-12-17 NOTE — Progress Notes (Signed)
   Subjective:   58 y.o. Married Caucasian female presents for evaluation of right axillary mass. 4 days ago. Has noted a low grade fever yesterday 99.5. No fever over 100. Took Advil a couple of days ago due to feeling uncomfortable. Noted while resting on side and felt tender. Onset of the symptoms was two days ago. Denies redness or injury to area. Denies breast tenderness or masses bilateral. Was scheduled for follow up at imaging after initial evaluation. No other health issues today.  Review of Systems Pertinent items noted in HPI and remainder of comprehensive ROS otherwise negative.   Objective:   General appearance: alert, cooperative, appears stated age and no distress  Neck: no adenopathy and thyroid not enlarged, symmetric, no tenderness/mass/nodules  Breasts: normal appearance, no masses or tenderness, No nipple retraction or dimpling, No nipple discharge or bleeding Enlarged lymph node in right central axillary  Left axillary area, no enlargement on exam    Physical Exam Chest:       Assessment:   ASSESSMENT:Patient is diagnosed with normal breast exam  Enlarged Right axillary lymph node History of enlarged Left axillary lymph node from suspected injury to finger 3 weeks ago? Plan:   PLAN: Discussed with patient findings and need for follow up US of right axillary area. Discussed maybe still related to injury she had, but needs to evaluated. Could not palpate enlargement on left at this time. Recommended CBC with diff to check for infection. Patient agreeable. She will be called with appointment information. Questions addressed.  Rv pr

## 2018-12-17 NOTE — Telephone Encounter (Signed)
Spoke with Anderson Malta at [facility]. Keep left axilla f/u as scheduled for 01/11/19. Patient will need right breast Dx MMG and [place] of right axilla. Orders placed. Anderson Malta will contact the patient directly to schedule.

## 2018-12-18 LAB — CBC WITH DIFFERENTIAL/PLATELET
Basophils Absolute: 0.1 10*3/uL (ref 0.0–0.2)
Basos: 1 %
EOS (ABSOLUTE): 0.1 10*3/uL (ref 0.0–0.4)
Eos: 1 %
Hematocrit: 45.2 % (ref 34.0–46.6)
Hemoglobin: 14.7 g/dL (ref 11.1–15.9)
Immature Grans (Abs): 0 10*3/uL (ref 0.0–0.1)
Immature Granulocytes: 0 %
Lymphocytes Absolute: 1.9 10*3/uL (ref 0.7–3.1)
Lymphs: 23 %
MCH: 31.3 pg (ref 26.6–33.0)
MCHC: 32.5 g/dL (ref 31.5–35.7)
MCV: 96 fL (ref 79–97)
Monocytes Absolute: 0.7 10*3/uL (ref 0.1–0.9)
Monocytes: 8 %
Neutrophils Absolute: 5.5 10*3/uL (ref 1.4–7.0)
Neutrophils: 67 %
Platelets: 192 10*3/uL (ref 150–450)
RBC: 4.69 x10E6/uL (ref 3.77–5.28)
RDW: 12.2 % (ref 11.7–15.4)
WBC: 8.3 10*3/uL (ref 3.4–10.8)

## 2018-12-20 ENCOUNTER — Ambulatory Visit: Payer: Commercial Managed Care - PPO | Admitting: Certified Nurse Midwife

## 2018-12-20 ENCOUNTER — Other Ambulatory Visit: Payer: Self-pay

## 2018-12-22 ENCOUNTER — Encounter: Payer: Self-pay | Admitting: Certified Nurse Midwife

## 2018-12-22 ENCOUNTER — Ambulatory Visit: Payer: Commercial Managed Care - PPO | Admitting: Certified Nurse Midwife

## 2018-12-22 ENCOUNTER — Other Ambulatory Visit: Payer: Self-pay

## 2018-12-22 VITALS — BP 110/70 | HR 70 | Temp 97.1°F | Resp 16 | Ht 63.0 in | Wt 145.0 lb

## 2018-12-22 DIAGNOSIS — R590 Localized enlarged lymph nodes: Secondary | ICD-10-CM | POA: Diagnosis not present

## 2018-12-22 DIAGNOSIS — Z23 Encounter for immunization: Secondary | ICD-10-CM

## 2018-12-22 DIAGNOSIS — N951 Menopausal and female climacteric states: Secondary | ICD-10-CM | POA: Diagnosis not present

## 2018-12-22 DIAGNOSIS — Z01419 Encounter for gynecological examination (general) (routine) without abnormal findings: Secondary | ICD-10-CM | POA: Diagnosis not present

## 2018-12-22 NOTE — Progress Notes (Addendum)
58 y.o. G0P0000 Married  Caucasian Fe here for annual exam. Menopausal on HRT, working well, desires continuance no vaginal dryness or bleeding. Sees PCP Warner MccreedyJessica Copeland for aex, labs and Lexapro./Adderall(Dr.Young).  Patient's last menstrual period was 09/06/2012.          Sexually active: Yes.    The current method of family planning is post menopausal status.   Female partner Exercising: No.  exercise Smoker:  no  Review of Systems  Constitutional: Negative.   HENT: Negative.   Eyes: Negative.   Respiratory: Negative.   Cardiovascular: Negative.   Gastrointestinal: Negative.   Genitourinary: Negative.   Musculoskeletal: Negative.   Skin: Negative.   Neurological: Negative.   Endo/Heme/Allergies: Negative.   Psychiatric/Behavioral: Negative.     Health Maintenance: Pap:  09-25-16 neg HPV HR neg History of Abnormal Pap: yes MMG:  11/2018 bilateral & left breast u/s category c density birads 3:probably benign Self Breast exams: occ Colonoscopy:  2016 neg f/u 9060yrs BMD:   none TDaP:  2010 Shingles: no Pneumonia: no Hep C and HIV: hep c neg 2018 Labs: PCP   reports that she has never smoked. She has never used smokeless tobacco. She reports that she does not drink alcohol or use drugs.  Past Medical History:  Diagnosis Date  . Abnormal Pap smear of cervix 1996  . Allergy   . Allergy, unspecified not elsewhere classified   . IBS (irritable bowel syndrome)   . Idiopathic hypersomnia   . 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 CRYOTHERAPY    . SHOULDER SURGERY     right  . TONSILLECTOMY      Current Outpatient Medications  Medication Sig Dispense Refill  . amphetamine-dextroamphetamine (ADDERALL) 10 MG tablet 1 or 2 daily if needed 30 tablet 0  . Armodafinil 250 MG tablet 1 in AM and 1/2 tab at noon 45 tablet 5  . escitalopram (LEXAPRO) 20 MG tablet TAKE 1 TABLET(20 MG) BY MOUTH DAILY 30 tablet 5   . estradiol (ESTRACE) 0.5 MG tablet Take 1 tablet (0.5 mg total) by mouth daily. 90 tablet 0  . Ibuprofen (ADVIL PO) Take by mouth as needed. Reported on 06/27/2015    . progesterone (PROMETRIUM) 100 MG capsule TAKE 1 CAPSULE(100 MG) BY MOUTH DAILY.CALL TO SCHEDULE YEARLY EXAM FOR FUTURE REFILLS 30 capsule 0   No current facility-administered medications for this visit.     Family History  Adopted: Yes  Problem Relation Age of Onset  . Cancer Mother        stomach  . Heart disease Mother   . Heart attack Mother   . Heart disease Father   . Colon cancer Neg Hx     ROS:  Pertinent items are noted in HPI.  Otherwise, a comprehensive ROS was negative.  Exam:   LMP 09/06/2012    Ht Readings from Last 3 Encounters:  07/16/18 5\' 4"  (1.626 m)  06/25/18 5\' 4"  (1.626 m)  05/27/18 5\' 4"  (1.626 m)    General appearance: alert, cooperative and appears stated age Head: Normocephalic, without obvious abnormality, atraumatic Neck: no adenopathy, supple, symmetrical, trachea midline and thyroid normal to inspection and palpation Lungs: clear to auscultation bilaterally Breasts: No nipple retraction or dimpling, No nipple discharge or bleeding, no tenderness in right axilla now, lymph node still palpated Heart: regular rate and rhythm Abdomen: soft, non-tender; no masses,  no organomegaly Extremities: extremities normal, atraumatic, no  cyanosis or edema Skin: Skin color, texture, turgor normal. No rashes or lesions Lymph nodes: Cervical, supraclavicular, normal. Right  axillary nodes still slightly enlarged, non tender, Left axillary slightly palpable, non tender No abnormal inguinal nodes palpated Neurologic: Grossly normal   Pelvic: External genitalia:  no lesions, normal female              Urethra:  normal appearing urethra with no masses, tenderness or lesions              Bartholin's and Skene's: normal                 Vagina: normal appearing vagina with normal color and discharge,  no lesions              Cervix: no cervical motion tenderness, no lesions and normal appearance              Pap taken: No. Bimanual Exam:  Uterus:  normal size, contour, position, consistency, mobility, non-tender and anteverted              Adnexa: normal adnexa and no mass, fullness, tenderness               Rectovaginal: Confirms               Anus:  normal sphincter tone, no lesions  Chaperone present: yes  A:  Well Woman with normal exam  Menopausal on HRT  Risks/benefits/warning signs of HRT discussed. Desires continuance  History of enlarged tender axillary lymph nodes, tenderness resolved, but still palpable  Anxiety/labs  with PCP management  Immunization due  P:   Reviewed health and wellness pertinent to exam  Aware of need to advise if vaginal bleeding  Rx Estrace see order with instructions,  Rx Prometrium see order with instructions   Discussed axillary finding and need to have [place] follow up assure all normal appearance now. Patient will keep appointment tomorrow.  Continue follow up with PCP  Requests TDAP  Pap smear: no   counseled on breast self exam, mammography screening, feminine hygiene, adequate intake of calcium and vitamin D, diet and exercise  return annually or prn  An After Visit Summary was printed and given to the patient.

## 2018-12-23 ENCOUNTER — Ambulatory Visit
Admission: RE | Admit: 2018-12-23 | Discharge: 2018-12-23 | Disposition: A | Discharge status: Home / Self Care | Payer: Commercial Managed Care - PPO | Source: Ambulatory Visit | Attending: Certified Nurse Midwife | Admitting: Certified Nurse Midwife

## 2018-12-23 ENCOUNTER — Other Ambulatory Visit: Payer: Self-pay | Admitting: Certified Nurse Midwife

## 2018-12-23 DIAGNOSIS — R599 Enlarged lymph nodes, unspecified: Secondary | ICD-10-CM

## 2018-12-29 ENCOUNTER — Encounter: Payer: Self-pay | Admitting: Family Medicine

## 2019-01-07 ENCOUNTER — Other Ambulatory Visit: Payer: Self-pay

## 2019-01-07 ENCOUNTER — Ambulatory Visit: Payer: Commercial Managed Care - PPO | Admitting: Internal Medicine

## 2019-01-07 ENCOUNTER — Encounter: Payer: Self-pay | Admitting: Internal Medicine

## 2019-01-07 DIAGNOSIS — G4711 Idiopathic hypersomnia with long sleep time: Secondary | ICD-10-CM | POA: Diagnosis not present

## 2019-01-07 MED ORDER — AMPHETAMINE-DEXTROAMPHETAMINE 10 MG PO TABS: ORAL_TABLET | ORAL | 0 refills | Status: DC

## 2019-01-07 MED ORDER — ARMODAFINIL 250 MG PO TABS: ORAL_TABLET | ORAL | 5 refills | Status: DC

## 2019-01-07 NOTE — Progress Notes (Signed)
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  Occasionally uses one half of a 10 mg Adderall has a "boost" but not daily.  Naps when she can and is careful with sleep habits.  Admits occasional difficulty initiating and maintaining sleep.  This does not seem associated with days she takes Adderall and has more to do with work and life stress.  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 Armodafinil 250 mg Melatonin for sleep. Uses 1 Nuvigil daily, adding occ adderall- not every day. Feels stable with this. No progression and no change of pattern.  Covid swab NEG 12/11/18 Had cardiology w/u but denies palpitation or problems now Evaluated for reactive adenopathy after injuring thumb. PFT 07/23/2018- minimal obstruction, overinflation, airtrapping, normal Diffusion, no response to BD CXR 05/27/2018 No acute pulmonary process identified.  ROS-see HPI   + = positive Constitutional:    weight loss, night sweats, fevers, chills, + fatigue, lassitude. HEENT:    headaches, difficulty  swallowing, tooth/dental problems, sore throat,       sneezing, itching, ear ache, nasal congestion, post nasal drip, snoring CV:    chest pain, orthopnea, PND, swelling in lower extremities, anasarca,                             dizziness, palpitations Resp:   shortness of breath with exertion or at rest.                productive cough,   non-productive cough, coughing up of blood.              change in color of mucus.  wheezing.   Skin:    rash or lesions. GI:  No-   heartburn, indigestion, abdominal pain, nausea, vomiting,e GU: . MS:   joint pain, stiffness, . Neuro-    + auditory hallucination Psych:  change in mood or affect.  depression or anxiety.   memory loss.  OBJ- Physical Exam General- Alert, Oriented, Affect-appropriate, Distress- none acute,  Skin- rash-none, lesions- none, excoriation- none Lymphadenopathy- none Head- atraumatic            Eyes- Gross vision intact, PERRLA, conjunctivae and secretions clear            Ears- Hearing, canals-normal            Nose- Clear, no-Septal dev, mucus, polyps, erosion, perforation             Throat- Mallampati II , mucosa clear , drainage- none, tonsils- atrophic Neck- flexible , trachea midline, no stridor , thyroid nl, carotid no bruit Chest - symmetrical excursion , unlabored           Heart/CV- RRR , no murmur , no gallop  , no rub, nl s1 s2                           -  JVD- none , edema- none, stasis changes- none, varices- none           Lung- clear to P&A, wheeze- none, cough- none , dullness-none, rub- none           Chest wall-  Abd-  Br/ Gen/ Rectal- Not done, not indicated Extrem- cyanosis- none, clubbing, none, atrophy- none, strength- nl Neuro- grossly intact to observation     

## 2019-01-07 NOTE — Assessment & Plan Note (Signed)
Stable pattern. Sleeps adequately with melatonin. Confortable daytime alertness with Nuvigil and 10 mg adderall with no tolerance or misuse concerns identified.  Plan- ok to continue current meds.

## 2019-01-07 NOTE — Patient Instructions (Addendum)
Refills sent as before  We can make changes or consider other meds as time goes on, if needed.  Please call if we can help

## 2019-01-11 ENCOUNTER — Other Ambulatory Visit: Payer: Self-pay | Admitting: Certified Nurse Midwife

## 2019-01-11 ENCOUNTER — Other Ambulatory Visit: Payer: Self-pay

## 2019-01-11 ENCOUNTER — Ambulatory Visit
Admission: RE | Admit: 2019-01-11 | Discharge: 2019-01-11 | Disposition: A | Discharge status: Home / Self Care | Payer: Commercial Managed Care - PPO | Source: Ambulatory Visit | Attending: Certified Nurse Midwife | Admitting: Certified Nurse Midwife

## 2019-01-11 DIAGNOSIS — R599 Enlarged lymph nodes, unspecified: Secondary | ICD-10-CM

## 2019-01-11 NOTE — Telephone Encounter (Signed)
PA was started via [email or web address]. Key is [id number]. Will check back for a determination later.

## 2019-01-12 ENCOUNTER — Telehealth: Payer: Self-pay | Admitting: Internal Medicine

## 2019-01-12 NOTE — Telephone Encounter (Signed)
Called 1800 number provided to start authorization, patient was approved for medication from 12/13/2018-01/12/2020.  Case ID: [id number]  Called [facility] back to give them a [phone] number to call to receive the quantity override information. Pharmacist at [facility] said authorization went through.   Called patient to let her know medication was approved.  Verbalized understanding, nothing further needed at this time.

## 2019-01-14 ENCOUNTER — Encounter: Payer: Self-pay | Admitting: Family Medicine

## 2019-02-08 ENCOUNTER — Encounter: Payer: Self-pay | Admitting: Family Medicine

## 2019-02-09 DIAGNOSIS — N189 Chronic kidney disease, unspecified: Secondary | ICD-10-CM

## 2019-02-11 MED ORDER — AMPHETAMINE-DEXTROAMPHETAMINE 10 MG PO TABS: ORAL_TABLET | ORAL | 0 refills | Status: DC

## 2019-02-11 NOTE — Telephone Encounter (Signed)
TP patient sent email wanting her Adderall refilled but [facility] to [facility] on [address].   TP please advise

## 2019-02-11 NOTE — Telephone Encounter (Signed)
PMP verified,  rx sent , recent visit with Dr. Annamaria Boots  With approval for refills for Adderral.  rx refill request

## 2019-02-14 ENCOUNTER — Other Ambulatory Visit: Payer: Self-pay | Admitting: Internal Medicine

## 2019-02-14 MED ORDER — ARMODAFINIL 250 MG PO TABS: ORAL_TABLET | ORAL | 5 refills | Status: DC

## 2019-02-14 NOTE — Telephone Encounter (Signed)
[facility]igil script was [facility] as requested

## 2019-02-14 NOTE — Telephone Encounter (Signed)
Dr. Annamaria Boots please see below email from patient requesting refill.      Email: The [facility] [address] [facility] 30 250mg  Nuvifil tablets in stock, could you send a prescription in to them, it is still on B/O at [facility].  Thanks  Allergies  Allergen Reactions  . Keflex [Cephalexin] Itching   Current Outpatient Medications on File Prior to Visit  Medication Sig Dispense Refill  . amphetamine-dextroamphetamine (ADDERALL) 10 MG tablet 1 or 2 daily if needed 30 tablet 0  . Armodafinil 250 MG tablet 1 in AM and 1/2 tab at noon 45 tablet 5  . escitalopram (LEXAPRO) 20 MG tablet TAKE 1 TABLET(20 MG) BY MOUTH DAILY 30 tablet 5  . estradiol (ESTRACE) 0.5 MG tablet Take 1 tablet (0.5 mg total) by mouth daily. 90 tablet 0  . Ibuprofen (ADVIL PO) Take by mouth as needed. Reported on 06/27/2015    . progesterone (PROMETRIUM) 100 MG capsule TAKE 1 CAPSULE(100 MG) BY MOUTH DAILY.CALL TO SCHEDULE YEARLY EXAM FOR FUTURE REFILLS 30 capsule 0   No current facility-administered medications on file prior to visit.

## 2019-02-18 ENCOUNTER — Encounter: Payer: Self-pay | Admitting: Family Medicine

## 2019-02-18 ENCOUNTER — Other Ambulatory Visit (INDEPENDENT_AMBULATORY_CARE_PROVIDER_SITE_OTHER): Payer: Commercial Managed Care - PPO

## 2019-02-18 ENCOUNTER — Other Ambulatory Visit: Payer: Self-pay

## 2019-02-18 DIAGNOSIS — N189 Chronic kidney disease, unspecified: Secondary | ICD-10-CM

## 2019-02-18 LAB — BASIC METABOLIC PANEL
BUN: 19 mg/dL (ref 6–23)
CO2: 29 mEq/L (ref 19–32)
Calcium: 9.2 mg/dL (ref 8.4–10.5)
Chloride: 102 mEq/L (ref 96–112)
Creatinine, Ser: 0.9 mg/dL (ref 0.40–1.20)
GFR: 64.16 mL/min (ref >60.00)
Glucose, Bld: 81 mg/dL (ref 70–99)
Potassium: 4 mEq/L (ref 3.5–5.1)
Sodium: 139 mEq/L (ref 135–145)

## 2019-02-22 ENCOUNTER — Other Ambulatory Visit: Payer: Self-pay | Admitting: Certified Nurse Midwife

## 2019-02-22 DIAGNOSIS — N951 Menopausal and female climacteric states: Secondary | ICD-10-CM

## 2019-02-22 NOTE — Telephone Encounter (Signed)
Medication refill request: Estrace Last AEX:  12/22/2018 DL Next AEX: 12/28/2019 Last MMG (if hormonal medication request): 12/06/2018 BIRADS 3 Probably benign Density C Refill authorized: Pending #90 with no refills if appropriate. Please advise.

## 2019-03-08 ENCOUNTER — Other Ambulatory Visit: Payer: Self-pay | Admitting: Certified Nurse Midwife

## 2019-03-08 ENCOUNTER — Other Ambulatory Visit: Payer: Self-pay | Admitting: Obstetrics and Gynecology

## 2019-03-08 DIAGNOSIS — N951 Menopausal and female climacteric states: Secondary | ICD-10-CM

## 2019-03-09 NOTE — Telephone Encounter (Signed)
This is a duplicate previously sent to DL for approval.

## 2019-03-09 NOTE — Telephone Encounter (Signed)
Medication refill request: Prometrium Last AEX:  12/22/2018 DL Next AEX: 12/28/2019 Last MMG (if hormonal medication request): 11/12/2017 BIRADS 1 Negative Density C -- Next MMG scheduled 04/14/2019 Refill authorized: Pending #30 with 9 refills if appropriate. Please advise.

## 2019-03-11 ENCOUNTER — Telehealth: Payer: Self-pay | Admitting: Certified Nurse Midwife

## 2019-03-11 NOTE — Telephone Encounter (Signed)
[facility] [address] for refill for patient on progesterone 100 mg quantity of 30.

## 2019-03-11 NOTE — Telephone Encounter (Signed)
Prescription for progesterone was sent to pharmacy requested on 03/09/2019. Pt informed. Encounter closed.

## 2019-03-15 ENCOUNTER — Other Ambulatory Visit: Payer: Self-pay | Admitting: Internal Medicine

## 2019-03-15 MED ORDER — AMPHETAMINE-DEXTROAMPHETAMINE 10 MG PO TABS: ORAL_TABLET | ORAL | 0 refills | Status: DC

## 2019-03-15 NOTE — Telephone Encounter (Signed)
Adderalll refill e-sent

## 2019-03-15 NOTE — Progress Notes (Signed)
Adderall refill e-sent 

## 2019-03-15 NOTE — Telephone Encounter (Signed)
Message received from Patient this morning.   I need a refill for Adderall. Please send to [facility] on [address].   Thank you, Miranda [name]  Last refill-02/11/19 Adderall 10mg , take 1 or 2 daily if needed, #30, no refills.  Message routed to Dr. Annamaria Boots  Allergies  Allergen Reactions  . Keflex [Cephalexin] Itching   Current Outpatient Medications on File Prior to Visit  Medication Sig Dispense Refill  . amphetamine-dextroamphetamine (ADDERALL) 10 MG tablet 1 or 2 daily if needed 30 tablet 0  . Armodafinil 250 MG tablet 1 in AM and 1/2 tab at noon 45 tablet 5  . escitalopram (LEXAPRO) 20 MG tablet TAKE 1 TABLET(20 MG) BY MOUTH DAILY 30 tablet 5  . estradiol (ESTRACE) 0.5 MG tablet TAKE 1 TABLET(0.5 MG) BY MOUTH DAILY 90 tablet 0  . Ibuprofen (ADVIL PO) Take by mouth as needed. Reported on 06/27/2015    . progesterone (PROMETRIUM) 100 MG capsule TAKE 1 CAPSULE(100 MG) BY MOUTH DAILY CALL DOCTOR TO SCHEDULE YEARLY EXAM 30 capsule 9   No current facility-administered medications on file prior to visit.

## 2019-04-01 ENCOUNTER — Telehealth: Payer: Commercial Managed Care - PPO | Admitting: Cardiology

## 2019-04-04 ENCOUNTER — Other Ambulatory Visit: Payer: Self-pay | Admitting: Family Medicine

## 2019-04-14 ENCOUNTER — Ambulatory Visit
Admission: RE | Admit: 2019-04-14 | Discharge: 2019-04-14 | Disposition: A | Discharge status: Home / Self Care | Payer: Commercial Managed Care - PPO | Source: Ambulatory Visit | Attending: Certified Nurse Midwife | Admitting: Certified Nurse Midwife

## 2019-04-14 ENCOUNTER — Other Ambulatory Visit: Payer: Self-pay

## 2019-04-14 DIAGNOSIS — R599 Enlarged lymph nodes, unspecified: Secondary | ICD-10-CM

## 2019-05-05 ENCOUNTER — Other Ambulatory Visit: Payer: Self-pay

## 2019-05-05 MED ORDER — AMPHETAMINE-DEXTROAMPHETAMINE 10 MG PO TABS: ORAL_TABLET | ORAL | 0 refills | Status: DC

## 2019-05-05 NOTE — Telephone Encounter (Signed)
CY please advise on requested refill.   Last refill: 03/15/2019 Last OV: 01/07/2019 Next OV: 01/09/2020

## 2019-05-05 NOTE — Telephone Encounter (Signed)
Adderall refill e-sent 

## 2019-06-22 ENCOUNTER — Other Ambulatory Visit: Payer: Self-pay

## 2019-06-22 NOTE — Telephone Encounter (Signed)
Last office visit 12/2018 Next f/u in 1 year 01/09/2020  Pt requesting refill on Adderall 10 mg.

## 2019-06-23 MED ORDER — AMPHETAMINE-DEXTROAMPHETAMINE 10 MG PO TABS: ORAL_TABLET | ORAL | 0 refills | Status: DC

## 2019-06-23 NOTE — Telephone Encounter (Signed)
Adderall refill e-sent 

## 2019-07-11 ENCOUNTER — Other Ambulatory Visit: Payer: Self-pay | Admitting: Internal Medicine

## 2019-07-11 MED ORDER — AMPHETAMINE-DEXTROAMPHETAMINE 10 MG PO TABS: ORAL_TABLET | ORAL | 0 refills | Status: DC

## 2019-07-11 NOTE — Telephone Encounter (Signed)
Adderall script sent to [facility]

## 2019-07-11 NOTE — Telephone Encounter (Signed)
Dr. Young, please see pt's mychart message and advise. 

## 2019-08-03 ENCOUNTER — Other Ambulatory Visit: Payer: Self-pay | Admitting: Certified Nurse Midwife

## 2019-08-03 DIAGNOSIS — N951 Menopausal and female climacteric states: Secondary | ICD-10-CM

## 2019-08-03 NOTE — Telephone Encounter (Signed)
Medication refill request: Estradiol  Last AEX:  12-22-2018 DL  Next AEX: 10-25-20  Last MMG (if hormonal medication request): 12-06-2018 BIRADS 3 probably benign  Refill authorized: Today, please advise.   Medication pended for #90, 0RF. Please refill if appropriate.

## 2019-08-15 ENCOUNTER — Encounter: Payer: Self-pay | Admitting: Certified Nurse Midwife

## 2019-08-15 ENCOUNTER — Other Ambulatory Visit: Payer: Self-pay

## 2019-08-15 MED ORDER — AMPHETAMINE-DEXTROAMPHETAMINE 10 MG PO TABS: ORAL_TABLET | ORAL | 0 refills | Status: DC

## 2019-08-15 NOTE — Telephone Encounter (Signed)
Adderall refill e-sent 

## 2019-08-15 NOTE — Telephone Encounter (Signed)
CY please advise on refill request for Adderall 10mg  tabs Last OV: 01/07/2019 Next OV: 01/09/2020 Last refill: 07/11/2019 #30 tabs take 1-2 daily if needed

## 2019-09-22 ENCOUNTER — Other Ambulatory Visit: Payer: Self-pay

## 2019-09-22 MED ORDER — AMPHETAMINE-DEXTROAMPHETAMINE 10 MG PO TABS: ORAL_TABLET | ORAL | 0 refills | Status: DC

## 2019-09-22 NOTE — Telephone Encounter (Signed)
Dr. Maple Hudson, please advise if you are okay refilling med.  Allergies  Allergen Reactions  . Keflex [Cephalexin] Itching     Current Outpatient Medications:  .  amphetamine-dextroamphetamine (ADDERALL) 10 MG tablet, 1 or 2 daily if needed, Disp: 30 tablet, Rfl: 0 .  Armodafinil 250 MG tablet, 1 in AM and 1/2 tab at noon, Disp: 45 tablet, Rfl: 5 .  escitalopram (LEXAPRO) 20 MG tablet, TAKE 1 TABLET(20 MG) BY MOUTH DAILY, Disp: 30 tablet, Rfl: 4 .  estradiol (ESTRACE) 0.5 MG tablet, TAKE 1 TABLET(0.5 MG) BY MOUTH DAILY, Disp: 90 tablet, Rfl: 0 .  Ibuprofen (ADVIL PO), Take by mouth as needed. Reported on 06/27/2015, Disp: , Rfl:  .  progesterone (PROMETRIUM) 100 MG capsule, TAKE 1 CAPSULE(100 MG) BY MOUTH DAILY CALL DOCTOR TO SCHEDULE YEARLY EXAM, Disp: 30 capsule, Rfl: 9

## 2019-09-22 NOTE — Telephone Encounter (Signed)
Adderall refill e-sent 

## 2019-10-21 ENCOUNTER — Other Ambulatory Visit: Payer: Self-pay | Admitting: Obstetrics and Gynecology

## 2019-10-21 ENCOUNTER — Other Ambulatory Visit: Payer: Self-pay

## 2019-10-21 DIAGNOSIS — N951 Menopausal and female climacteric states: Secondary | ICD-10-CM

## 2019-10-21 MED ORDER — PROMETRIUM 100 MG PO CAPS: 100.0000 mg | ORAL_CAPSULE | Freq: Every day | ORAL | 0 refills | Status: DC

## 2019-10-21 MED ORDER — AMPHETAMINE-DEXTROAMPHETAMINE 10 MG PO TABS: ORAL_TABLET | ORAL | 0 refills | Status: DC

## 2019-10-21 MED ORDER — ESTRADIOL 0.5 MG PO TABS: 0.5000 mg | ORAL_TABLET | Freq: Every day | ORAL | 0 refills | Status: DC

## 2019-10-21 NOTE — Telephone Encounter (Signed)
Script sent for estrogen and prometrium, she will run out of that soon as well. Let message for pharmacist to have her schedule an appointment.

## 2019-10-21 NOTE — Telephone Encounter (Signed)
Medication refill request: Estradiol Last AEX:  12/22/18 DL Next AEX: none scheduled  Last MMG (if hormonal medication request): 11/2018 bilateral & left breast u/s category c density birads 3:probably benign Refill authorized: Please advise on refill   Tried calling patient to schedule AEX. The number listed for patient has been disconnected.

## 2019-10-21 NOTE — Telephone Encounter (Signed)
Adderall refill e-sent 

## 2019-10-27 ENCOUNTER — Other Ambulatory Visit: Payer: Self-pay

## 2019-10-27 ENCOUNTER — Encounter: Payer: Self-pay | Admitting: Family Medicine

## 2019-10-27 ENCOUNTER — Telehealth (INDEPENDENT_AMBULATORY_CARE_PROVIDER_SITE_OTHER): Payer: Managed Care, Other (non HMO) | Admitting: Family Medicine

## 2019-10-27 DIAGNOSIS — J209 Acute bronchitis, unspecified: Secondary | ICD-10-CM

## 2019-10-27 MED ORDER — DOXYCYCLINE HYCLATE 100 MG PO CAPS: 100.0000 mg | ORAL_CAPSULE | Freq: Two times a day (BID) | ORAL | 0 refills | Status: DC

## 2019-10-27 NOTE — Progress Notes (Signed)
[facility] at [facility] [address] [place], [place] [address] [phone]  Date:  10/27/2019   Name:  Miranda [name]   DOB:  November 08, 1960   MRN:  [id number]  PCP:  Pearline Cables, MD    Chief Complaint: No chief complaint on file.   History of Present Illness:  Miranda [name] is a 59 y.o. very pleasant female patient who presents with the following:  Virtual visit today for concern of illness. Patient identity confirmed with 2 factors, she gives consent for virtual visit today.  Patient location is home, provider location is office The patient and myself are present on the call today  Miranda [name] has history of seronegative arthritis, palpitations, cervical spine disease She is not currently taking any immunomodulators or prednisone Last seen on myself in January 2020  She got sick this past Friday- 6 days ago.  Not improving She notes a cough that hurts her chest No fever noted  She does have body aches  The cough can be productive but mostly dry She did have a ST, now resolved No vomiting or diarrhea- she did feel nauseated but this resolved  No urinary sx  She has tried tylenol and OTC cough meds She did not get a covid test during this illness  COVID-19 vaccine is done  Her son was sick recently with similar sx but he is not as ill as she is   Patient Active Problem List   Diagnosis Date Noted  . Atypical chest pain 06/25/2018  . Abnormal stress test 06/25/2018  . 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 Seronegative arthritis 08/29/2014  . Dyspnea 04/14/2013  . Palpitations 04/14/2013  . 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 ALLERGY 05/10/2007    Past Medical History:  Diagnosis Date  . Abnormal Pap smear of cervix 1996  . Allergy    . Allergy, unspecified not elsewhere classified   . IBS (irritable bowel syndrome)   . Idiopathic hypersomnia   . 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 CRYOTHERAPY    . SHOULDER SURGERY     right  . TONSILLECTOMY      Social History   Tobacco Use  . Smoking status: Never Smoker  . Smokeless tobacco: Never Used  Substance Use Topics  . Alcohol use: No    Alcohol/week: 0.0 standard drinks  . Drug use: No    Family History  Adopted: Yes  Problem Relation Age of Onset  . Cancer Mother        stomach  . Heart disease Mother   . Heart attack Mother   . Heart disease Father   . Colon cancer Neg Hx     Allergies  Allergen Reactions  . Keflex [Cephalexin] Itching    Medication list has been reviewed and updated.  Current Outpatient Medications on File Prior to Visit  Medication Sig Dispense Refill  . amphetamine-dextroamphetamine (ADDERALL) 10 MG tablet 1 or 2 daily if needed 30 tablet 0  . Armodafinil 250 MG tablet 1 in AM and 1/2 tab at noon 45 tablet 5  . escitalopram (LEXAPRO) 20 MG tablet TAKE 1 TABLET(20 MG) BY MOUTH DAILY 30 tablet 4  . estradiol (ESTRACE) 0.5 MG tablet Take 1 tablet (0.5 mg  total) by mouth daily. 90 tablet 0  . Ibuprofen (ADVIL PO) Take by mouth as needed. Reported on 06/27/2015    . progesterone (PROMETRIUM) 100 MG capsule TAKE 1 CAPSULE(100 MG) BY MOUTH DAILY CALL DOCTOR TO SCHEDULE YEARLY EXAM 30 capsule 9  . PROMETRIUM 100 MG capsule Take 1 capsule (100 mg total) by mouth daily. 90 capsule 0   No current facility-administered medications on file prior to visit.    Review of Systems:  As per HPI- otherwise negative.   Physical Examination: There were no vitals filed for this visit. There were no vitals filed for this visit. There is no height or weight on file to calculate BMI. Ideal Body Weight:    Pt observed via video monitor -she looks well, her normal self.   She is coughing frequently  Assessment and Plan: Acute bronchitis, unspecified organism - Plan: doxycycline (VIBRAMYCIN) 100 MG capsule  Virtual visit today for concern of likely bronchitis.  Patient has been sick for almost a week with cough, she notes that she is not particularly feeling better and her cough may be somewhat worse.  She has not run a fever.  She has had both of her COVID-19 vaccines.  I called in a course of doxycycline for her to use twice a day with meals.  I did recommend that she gets a COVID-19 test, perhaps at her [place] to make sure this is not actually Covid.  She will let me know the results of her test, and will update me if not feeling better over the next few days.  Sooner if worse  Video used for the entirety of this visit  Signed Lamar Blinks, MD

## 2019-11-01 ENCOUNTER — Other Ambulatory Visit: Payer: Self-pay | Admitting: Family Medicine

## 2019-11-23 NOTE — Progress Notes (Addendum)
[facility] at [facility] [address] [place], [place] [address] [phone]  Date:  11/24/2019   Name:  Miranda [name]   DOB:  September 08, 1960   MRN:  [id number]  PCP:  Pearline Cables, MD    Chief Complaint: No chief complaint on file.   History of Present Illness:  Miranda [name] is a 59 y.o. very pleasant female patient who presents with the following:  Patient here today for routine physical Last seen by myself  History of seronegative arthritis, connective tissue disease, difficulty with sleep and hypersomnia, mood disorder, cervical spine disease, palpitations  I saw her most recently for virtual visit on June 3 with illness- treated with doxycycline for bronchitis These sx are cleared up   Pap 2018-negative, HPV negative, done per GYN  Mammogram July 2020 Colonoscopy up-to-date Tetanus up-to-date Covid series complete Shingrix- will start today  Due for routine labs today- she is not fasting today   She is working out at [employer]- she enjoys lifting weights and doing cardio.  She will exercise 3-5 days a week,  No CP or SOB No PMB  [place]   Patient Active Problem List   Diagnosis Date Noted  . Atypical chest pain 06/25/2018  . Abnormal stress test 06/25/2018  . 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 Seronegative arthritis 08/29/2014  . Dyspnea 04/14/2013  . Palpitations 04/14/2013  . 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 ALLERGY 05/10/2007    Past Medical History:  Diagnosis Date  . Abnormal Pap smear of cervix 1996  . Allergy   . Allergy, unspecified not elsewhere classified   . IBS (irritable bowel syndrome)   . Idiopathic hypersomnia   . RA (rheumatoid arthritis) (HCC)    cero-negative  RA    Past Surgical History:  Procedure Laterality Date  . CRYOTHERAPY    . SHOULDER SURGERY     right  . TONSILLECTOMY      Social History   Tobacco Use  . Smoking status: Never Smoker  . Smokeless tobacco: Never Used  Vaping Use  . Vaping Use: Never used  Substance Use Topics  . Alcohol use: No    Alcohol/week: 0.0 standard drinks  . Drug use: No    Family History  Adopted: Yes  Problem Relation Age of Onset  . Cancer Mother        stomach  . Heart disease Mother   . Heart attack Mother   . Heart disease Father   . Colon cancer Neg Hx     Allergies  Allergen Reactions  . Keflex [Cephalexin] Itching    Medication list has been reviewed and updated.  Current Outpatient Medications on File Prior to Visit  Medication Sig Dispense Refill  . amphetamine-dextroamphetamine (ADDERALL) 10 MG tablet 1 or 2 daily if needed 30 tablet 0  . Armodafinil 250 MG tablet 1 in AM and 1/2 tab at noon 45 tablet 5  . escitalopram (LEXAPRO) 20 MG tablet TAKE 1 TABLET(20 MG) BY MOUTH DAILY. 9 tablet 3  . estradiol (ESTRACE) 0.5 MG tablet Take 1 tablet (0.5 mg total) by mouth daily. 90 tablet 0  . Ibuprofen (ADVIL PO) Take by mouth as needed. Reported on 06/27/2015    . progesterone (PROMETRIUM) 100 MG capsule TAKE 1  CAPSULE(100 MG) BY MOUTH DAILY CALL DOCTOR TO SCHEDULE YEARLY EXAM 30 capsule 9  . PROMETRIUM 100 MG capsule Take 1 capsule (100 mg total) by mouth daily. 90 capsule 0   No current facility-administered medications on file prior to visit.    Review of Systems:  As per HPI- otherwise negative.   Physical Examination: Vitals:   11/24/19 1302  BP: 109/72  Pulse: 74  Resp: 16  Temp: (!) 97.5 F (36.4 C)  SpO2: 99%   Vitals:   11/24/19 1302  Weight: 137 lb 9.6 oz (62.4 kg)  Height: 5\' 4"  (1.626 m)   Body mass index is 23.62 kg/m. Ideal Body Weight: Weight in (lb) to have BMI = 25: 145.3  GEN: no acute distress.  Normal weight, looks well, fit build  HEENT:  Atraumatic, Normocephalic.   Bilateral TM wnl, oropharynx normal.  PEERL,EOMI.   Ears and Nose: No external deformity. CV: RRR, No M/G/R. No JVD. No thrill. No extra heart sounds. PULM: CTA B, no wheezes, crackles, rhonchi. No retractions. No resp. distress. No accessory muscle use. ABD: S, NT, ND EXTR: No c/c/e PSYCH: Normally interactive. Conversant.    Assessment and Plan: Physical exam  Chronic renal impairment, unspecified CKD stage  Seronegative arthritis  Cervical spine disease  Screening for thyroid disorder - Plan: TSH  Screening for deficiency anemia - Plan: CBC  Screening for diabetes mellitus - Plan: Comprehensive metabolic panel, Hemoglobin A1c  Screening for hyperlipidemia - Plan: Lipid panel  Screening for HIV (human immunodeficiency virus) - Plan: HIV Antibody (routine testing w rflx)  Screening for cervical cancer  Immunization due - Plan: Varicella-zoster vaccine IM (Shingrix)  Mood disorder (HCC) - Plan: escitalopram (LEXAPRO) 20 MG tablet  CPE today  Doing well overall Continues to do well with lexapro, refilled for her today Labs pending as above Start shingles series Encourage continued exercise and healthy diet  This visit occurred during the SARS-CoV-2 public health emergency.  Safety protocols were in place, including screening questions prior to the visit, additional usage of staff PPE, and extensive cleaning of exam room while observing appropriate contact time as indicated for disinfecting solutions.    Signed , MD Received her labs as below, message to pt  Results for orders placed or performed in visit on 11/24/19  CBC  Result Value Ref Range   WBC 7.6 4.0 - 10.5 K/uL   RBC 4.32 3.87 - 5.11 Mil/uL   Platelets 185.0 150 - 400 K/uL   Hemoglobin 13.6 12.0 - 15.0 g/dL   HCT 01/25/20 36 - 46 %   MCV 94.9 78.0 - 100.0 fl   MCHC 33.1 30.0 - 36.0 g/dL   RDW 34.1 96.2 - 22.9 %  Comprehensive metabolic panel  Result Value Ref  Range   Sodium 138 135 - 145 mEq/L   Potassium 4.2 3.5 - 5.1 mEq/L   Chloride 102 96 - 112 mEq/L   CO2 31 19 - 32 mEq/L   Glucose, Bld 84 70 - 99 mg/dL   BUN 19 6 - 23 mg/dL   Creatinine, Ser 79.8 0.40 - 1.20 mg/dL   Total Bilirubin 0.7 0.2 - 1.2 mg/dL   Alkaline Phosphatase 77 39 - 117 U/L   AST 20 0 - 37 U/L   ALT 16 0 - 35 U/L   Total Protein 6.0 6.0 - 8.3 g/dL   Albumin 4.0 3.5 - 5.2 g/dL   GFR 9.21 (L) 19.41 mL/min   Calcium 9.2 8.4 - 10.5 mg/dL  Hemoglobin A1c  Result Value Ref Range   Hgb A1c MFr Bld 5.7 4.6 - 6.5 %  Lipid panel  Result Value Ref Range   Cholesterol 167 0 - 200 mg/dL   Triglycerides 19.4 0 - 149 mg/dL   HDL 17.40 >81.44 mg/dL   VLDL 81.8 0.0 - 56.3 mg/dL   LDL Cholesterol 85 0 - 99 mg/dL   Total CHOL/HDL Ratio 3    NonHDL 104.97   TSH  Result Value Ref Range   TSH 2.04 0.35 - 4.50 uIU/mL

## 2019-11-23 NOTE — Patient Instructions (Addendum)
It was great to see you again today, I will be in touch with your labs as soon as possible You got your first shingrix today- 2nd dose in 2-6 months Keep up the good work with exercise!     Health Maintenance, Female Adopting a healthy lifestyle and getting preventive care are important in promoting health and wellness. Ask your health care provider about:  The right schedule for you to have regular tests and exams.  Things you can do on your own to prevent diseases and keep yourself healthy. What should I know about diet, weight, and exercise? Eat a healthy diet   Eat a diet that includes plenty of vegetables, fruits, low-fat dairy products, and lean protein.  Do not eat a lot of foods that are high in solid fats, added sugars, or sodium. Maintain a healthy weight Body mass index (BMI) is used to identify weight problems. It estimates body fat based on height and weight. Your health care provider can help determine your BMI and help you achieve or maintain a healthy weight. Get regular exercise Get regular exercise. This is one of the most important things you can do for your health. Most adults should:  Exercise for at least 150 minutes each week. The exercise should increase your heart rate and make you sweat (moderate-intensity exercise).  Do strengthening exercises at least twice a week. This is in addition to the moderate-intensity exercise.  Spend less time sitting. Even light physical activity can be beneficial. Watch cholesterol and blood lipids Have your blood tested for lipids and cholesterol at 59 years of age, then have this test every 5 years. Have your cholesterol levels checked more often if:  Your lipid or cholesterol levels are high.  You are older than 59 years of age.  You are at high risk for heart disease. What should I know about cancer screening? Depending on your health history and family history, you may need to have cancer screening at various ages.  This may include screening for:  Breast cancer.  Cervical cancer.  Colorectal cancer.  Skin cancer.  Lung cancer. What should I know about heart disease, diabetes, and high blood pressure? Blood pressure and heart disease  High blood pressure causes heart disease and increases the risk of stroke. This is more likely to develop in people who have high blood pressure readings, are of African descent, or are overweight.  Have your blood pressure checked: ? Every 3-5 years if you are 81-72 years of age. ? Every year if you are 62 years old or older. Diabetes Have regular diabetes screenings. This checks your fasting blood sugar level. Have the screening done:  Once every three years after age 77 if you are at a normal weight and have a low risk for diabetes.  More often and at a younger age if you are overweight or have a high risk for diabetes. What should I know about preventing infection? Hepatitis B If you have a higher risk for hepatitis B, you should be screened for this virus. Talk with your health care provider to find out if you are at risk for hepatitis B infection. Hepatitis C Testing is recommended for:  Everyone born from [birth year] through 1965.  Anyone with known risk factors for hepatitis C. Sexually transmitted infections (STIs)  Get screened for STIs, including gonorrhea and chlamydia, if: ? You are sexually active and are younger than 59 years of age. ? You are older than 59 years of age and your  health care provider tells you that you are at risk for this type of infection. ? Your sexual activity has changed since you were last screened, and you are at increased risk for chlamydia or gonorrhea. Ask your health care provider if you are at risk.  Ask your health care provider about whether you are at high risk for HIV. Your health care provider may recommend a prescription medicine to help prevent HIV infection. If you choose to take medicine to prevent HIV, you  should first get tested for HIV. You should then be tested every 3 months for as long as you are taking the medicine. Pregnancy  If you are about to stop having your period (premenopausal) and you may become pregnant, seek counseling before you get pregnant.  Take 400 to 800 micrograms (mcg) of folic acid every day if you become pregnant.  Ask for birth control (contraception) if you want to prevent pregnancy. Osteoporosis and menopause Osteoporosis is a disease in which the bones lose minerals and strength with aging. This can result in bone fractures. If you are 67 years old or older, or if you are at risk for osteoporosis and fractures, ask your health care provider if you should:  Be screened for bone loss.  Take a calcium or vitamin D supplement to lower your risk of fractures.  Be given hormone replacement therapy (HRT) to treat symptoms of menopause. Follow these instructions at home: Lifestyle  Do not use any products that contain nicotine or tobacco, such as cigarettes, e-cigarettes, and chewing tobacco. If you need help quitting, ask your health care provider.  Do not use street drugs.  Do not share needles.  Ask your health care provider for help if you need support or information about quitting drugs. Alcohol use  Do not drink alcohol if: ? Your health care provider tells you not to drink. ? You are pregnant, may be pregnant, or are planning to become pregnant.  If you drink alcohol: ? Limit how much you use to 0-1 drink a day. ? Limit intake if you are breastfeeding.  Be aware of how much alcohol is in your drink. In the [place] one drink equals one 12 oz bottle of beer (355 mL), one 5 oz glass of wine (148 mL), or one 1 oz glass of hard liquor (44 mL). General instructions  Schedule regular health, dental, and eye exams.  Stay current with your vaccines.  Tell your health care provider if: ? You often feel depressed. ? You have ever been abused or do not feel  safe at home. Summary  Adopting a healthy lifestyle and getting preventive care are important in promoting health and wellness.  Follow your health care provider's instructions about healthy diet, exercising, and getting tested or screened for diseases.  Follow your health care provider's instructions on monitoring your cholesterol and blood pressure. This information is not intended to replace advice given to you by your health care provider. Make sure you discuss any questions you have with your health care provider. Document Revised: 05/05/2018 Document Reviewed: 05/05/2018 Elsevier Patient Education  2020 [employer].

## 2019-11-24 ENCOUNTER — Encounter: Payer: Self-pay | Admitting: Family Medicine

## 2019-11-24 ENCOUNTER — Other Ambulatory Visit: Payer: Self-pay

## 2019-11-24 ENCOUNTER — Ambulatory Visit (INDEPENDENT_AMBULATORY_CARE_PROVIDER_SITE_OTHER): Payer: Managed Care, Other (non HMO) | Admitting: Family Medicine

## 2019-11-24 VITALS — BP 109/72 | HR 74 | Temp 97.5°F | Resp 16 | Ht 64.0 in | Wt 137.6 lb

## 2019-11-24 DIAGNOSIS — Z130 Encounter for screening for diseases of the blood and blood-forming organs and certain disorders involving the immune mechanism: Secondary | ICD-10-CM | POA: Diagnosis not present

## 2019-11-24 DIAGNOSIS — Z1329 Encounter for screening for other suspected endocrine disorder: Secondary | ICD-10-CM | POA: Diagnosis not present

## 2019-11-24 DIAGNOSIS — M489 Spondylopathy, unspecified: Secondary | ICD-10-CM | POA: Diagnosis not present

## 2019-11-24 DIAGNOSIS — Z13220 Encounter for screening for lipoid disorders: Secondary | ICD-10-CM | POA: Diagnosis not present

## 2019-11-24 DIAGNOSIS — Z0000 Encounter for general adult medical examination without abnormal findings: Secondary | ICD-10-CM

## 2019-11-24 DIAGNOSIS — F39 Unspecified mood [affective] disorder: Secondary | ICD-10-CM

## 2019-11-24 DIAGNOSIS — N189 Chronic kidney disease, unspecified: Secondary | ICD-10-CM

## 2019-11-24 DIAGNOSIS — Z23 Encounter for immunization: Secondary | ICD-10-CM | POA: Diagnosis not present

## 2019-11-24 DIAGNOSIS — Z131 Encounter for screening for diabetes mellitus: Secondary | ICD-10-CM | POA: Diagnosis not present

## 2019-11-24 DIAGNOSIS — M1380 Other specified arthritis, unspecified site: Secondary | ICD-10-CM | POA: Diagnosis not present

## 2019-11-24 DIAGNOSIS — Z114 Encounter for screening for human immunodeficiency virus [HIV]: Secondary | ICD-10-CM

## 2019-11-24 DIAGNOSIS — Z124 Encounter for screening for malignant neoplasm of cervix: Secondary | ICD-10-CM

## 2019-11-24 LAB — LIPID PANEL
Cholesterol: 167 mg/dL (ref 0–200)
HDL: 61.8 mg/dL (ref >39.00)
LDL Cholesterol: 85 mg/dL (ref 0–99)
NonHDL: 104.97
Total CHOL/HDL Ratio: 3
Triglycerides: 99 mg/dL (ref 0.0–149.0)
VLDL: 19.8 mg/dL (ref 0.0–40.0)

## 2019-11-24 LAB — COMPREHENSIVE METABOLIC PANEL
ALT: 16 U/L (ref 0–35)
AST: 20 U/L (ref 0–37)
Albumin: 4 g/dL (ref 3.5–5.2)
Alkaline Phosphatase: 77 U/L (ref 39–117)
BUN: 19 mg/dL (ref 6–23)
CO2: 31 mEq/L (ref 19–32)
Calcium: 9.2 mg/dL (ref 8.4–10.5)
Chloride: 102 mEq/L (ref 96–112)
Creatinine, Ser: 0.99 mg/dL (ref 0.40–1.20)
GFR: 57.32 mL/min — ABNORMAL LOW (ref >60.00)
Glucose, Bld: 84 mg/dL (ref 70–99)
Potassium: 4.2 mEq/L (ref 3.5–5.1)
Sodium: 138 mEq/L (ref 135–145)
Total Bilirubin: 0.7 mg/dL (ref 0.2–1.2)
Total Protein: 6 g/dL (ref 6.0–8.3)

## 2019-11-24 LAB — CBC
HCT: 41 % (ref 36.0–46.0)
Hemoglobin: 13.6 g/dL (ref 12.0–15.0)
MCHC: 33.1 g/dL (ref 30.0–36.0)
MCV: 94.9 fl (ref 78.0–100.0)
Platelets: 185 10*3/uL (ref 150.0–400.0)
RBC: 4.32 Mil/uL (ref 3.87–5.11)
RDW: 13.2 % (ref 11.5–15.5)
WBC: 7.6 10*3/uL (ref 4.0–10.5)

## 2019-11-24 LAB — TSH: TSH: 2.04 u[IU]/mL (ref 0.35–4.50)

## 2019-11-24 LAB — HEMOGLOBIN A1C: Hgb A1c MFr Bld: 5.7 % (ref 4.6–6.5)

## 2019-11-24 MED ORDER — ESCITALOPRAM OXALATE 20 MG PO TABS: ORAL_TABLET | ORAL | 3 refills | Status: DC

## 2019-11-24 MED ORDER — AMPHETAMINE-DEXTROAMPHETAMINE 10 MG PO TABS: ORAL_TABLET | ORAL | 0 refills | Status: DC

## 2019-11-24 NOTE — Telephone Encounter (Signed)
Adderall refill e-sent 

## 2019-11-25 LAB — HIV ANTIBODY (ROUTINE TESTING W REFLEX): HIV 1&2 Ab, 4th Generation: NONREACTIVE

## 2019-12-28 ENCOUNTER — Ambulatory Visit: Payer: Commercial Managed Care - PPO | Admitting: Certified Nurse Midwife

## 2020-01-09 ENCOUNTER — Ambulatory Visit (INDEPENDENT_AMBULATORY_CARE_PROVIDER_SITE_OTHER): Payer: Managed Care, Other (non HMO) | Admitting: Internal Medicine

## 2020-01-09 ENCOUNTER — Encounter: Payer: Self-pay | Admitting: Internal Medicine

## 2020-01-09 ENCOUNTER — Other Ambulatory Visit: Payer: Self-pay

## 2020-01-09 DIAGNOSIS — G4700 Insomnia, unspecified: Secondary | ICD-10-CM | POA: Diagnosis not present

## 2020-01-09 DIAGNOSIS — R002 Palpitations: Secondary | ICD-10-CM

## 2020-01-09 DIAGNOSIS — G4711 Idiopathic hypersomnia with long sleep time: Secondary | ICD-10-CM | POA: Diagnosis not present

## 2020-01-09 MED ORDER — AMPHETAMINE-DEXTROAMPHET ER 20 MG PO CP24: 20.0000 mg | ORAL_CAPSULE | Freq: Every day | ORAL | 0 refills | Status: DC

## 2020-01-09 MED ORDER — ARMODAFINIL 250 MG PO TABS: ORAL_TABLET | ORAL | 5 refills | Status: DC

## 2020-01-09 MED ORDER — AMPHETAMINE-DEXTROAMPHETAMINE 10 MG PO TABS: ORAL_TABLET | ORAL | 0 refills | Status: DC

## 2020-01-09 NOTE — Patient Instructions (Signed)
Refilled Aderall 10 mg and added  adderall 20 mg XR to act as a baseline med if helpful, once daily  Refilled armodafinil  Please call if we can help

## 2020-01-09 NOTE — Assessment & Plan Note (Signed)
She reports a regular bedtime with need for longer sleep in recent months

## 2020-01-09 NOTE — Progress Notes (Signed)
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 Armodafinil 250 mg Melatonin for sleep. Uses 1 Nuvigil daily, adding occ adderall- not every day. Feels stable with this. No progression and no change of pattern.  Covid swab NEG 12/11/18 Had cardiology w/u but denies palpitation or problems now Evaluated for reactive adenopathy after injuring thumb. PFT 07/23/2018- minimal obstruction, overinflation, airtrapping, normal Diffusion, no response to BD CXR 05/27/2018 No acute pulmonary process identified.  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 No adverse effects from these meds.  Denies changes in general help  ROS-see HPI   + = positive Constitutional:    weight loss, night sweats, fevers, chills, + fatigue, lassitude. HEENT:    headaches, difficulty swallowing, tooth/dental problems, sore throat,       sneezing, itching, ear ache, nasal congestion, post nasal drip, snoring CV:    chest pain, orthopnea, PND, swelling in lower extremities, anasarca,                             dizziness, palpitations Resp:   shortness of breath with exertion or at rest.                 productive cough,   non-productive cough, coughing up of blood.              change in color of mucus.  wheezing.   Skin:    rash or lesions. GI:  No-   heartburn, indigestion, abdominal pain, nausea, vomiting,e GU: . MS:   joint pain, stiffness, . Neuro-    + auditory hallucination Psych:  change in mood or affect.  depression or anxiety.   memory loss.  OBJ- Physical Exam General- Alert, Oriented, Affect-appropriate, Distress- none acute, looks well Skin- rash-none, lesions- none, excoriation- none Lymphadenopathy- none Head- atraumatic            Eyes- Gross vision intact, PERRLA, conjunctivae and secretions clear            Ears- Hearing, canals-normal            Nose- Clear, no-Septal dev, mucus, polyps, erosion, perforation             Throat- Mallampati II , mucosa clear , drainage- none, tonsils- atrophic Neck- flexible , trachea midline, no stridor , thyroid nl, carotid no bruit Chest - symmetrical excursion , unlabored           Heart/CV- RRR , no murmur , no gallop  , no rub, nl s1 s2                           - JVD- none , edema- none, stasis changes- none, varices- none           Lung- clear to P&A, wheeze- none, cough- none , dullness-none, rub- none  Chest wall-  Abd-  Br/ Gen/ Rectal- Not done, not indicated Extrem- cyanosis- none, clubbing, none, atrophy- none, strength- nl Neuro- grossly intact to observation

## 2020-01-09 NOTE — Assessment & Plan Note (Signed)
Specifically denied at this visit, and very regular on exam. Plan- she will report if recurrent and if it seems medication related

## 2020-01-09 NOTE — Assessment & Plan Note (Signed)
Exacerbation of daytime somnolence lately without apparent reason or life change reported. Plan- adderall 10 mg and armodafinil are filled with discussion. Also adding adderall 20 mg XR as a foundation maintenance med as discussed.

## 2020-01-17 NOTE — Telephone Encounter (Signed)
PA request received from patient  Drug requested: nuvigil 250 CMM Key: [id number] Tried/failed: on file Covered alternatives:  PA request has been sent to plan, and a determination is expected within 3 days.   Routing to triage for follow-up.

## 2020-01-18 ENCOUNTER — Other Ambulatory Visit: Payer: Self-pay | Admitting: Obstetrics and Gynecology

## 2020-01-18 NOTE — Telephone Encounter (Signed)
Checked CMM no updates as of yet.  

## 2020-01-19 ENCOUNTER — Other Ambulatory Visit: Payer: Self-pay | Admitting: Obstetrics and Gynecology

## 2020-01-19 DIAGNOSIS — N951 Menopausal and female climacteric states: Secondary | ICD-10-CM

## 2020-01-26 ENCOUNTER — Other Ambulatory Visit: Payer: Self-pay | Admitting: Family Medicine

## 2020-01-26 DIAGNOSIS — Z1231 Encounter for screening mammogram for malignant neoplasm of breast: Secondary | ICD-10-CM

## 2020-01-27 ENCOUNTER — Ambulatory Visit
Admission: RE | Admit: 2020-01-27 | Discharge: 2020-01-27 | Disposition: A | Discharge status: Home / Self Care | Payer: Managed Care, Other (non HMO) | Source: Ambulatory Visit

## 2020-01-27 ENCOUNTER — Other Ambulatory Visit: Payer: Self-pay

## 2020-01-27 DIAGNOSIS — Z1231 Encounter for screening mammogram for malignant neoplasm of breast: Secondary | ICD-10-CM

## 2020-02-27 ENCOUNTER — Encounter: Payer: Self-pay | Admitting: Family Medicine

## 2020-02-27 DIAGNOSIS — F39 Unspecified mood [affective] disorder: Secondary | ICD-10-CM

## 2020-02-27 MED ORDER — ESCITALOPRAM OXALATE 20 MG PO TABS: ORAL_TABLET | ORAL | 3 refills | Status: DC

## 2020-03-08 ENCOUNTER — Ambulatory Visit: Payer: Managed Care, Other (non HMO) | Admitting: Family Medicine

## 2020-03-11 ENCOUNTER — Other Ambulatory Visit: Payer: Self-pay

## 2020-03-12 MED ORDER — AMPHETAMINE-DEXTROAMPHET ER 20 MG PO CP24: 20.0000 mg | ORAL_CAPSULE | Freq: Every day | ORAL | 0 refills | Status: DC

## 2020-03-12 MED ORDER — AMPHETAMINE-DEXTROAMPHETAMINE 10 MG PO TABS: ORAL_TABLET | ORAL | 0 refills | Status: DC

## 2020-03-12 NOTE — Telephone Encounter (Signed)
Meds refilled as requested.

## 2020-03-12 NOTE — Telephone Encounter (Signed)
Dr. Maple Hudson, We received a request to refill her prescriptions Adderall and Adderall xl.  Please advise.  Thank you.

## 2020-03-19 ENCOUNTER — Other Ambulatory Visit: Payer: Managed Care, Other (non HMO)

## 2020-03-19 DIAGNOSIS — Z20822 Contact with and (suspected) exposure to covid-19: Secondary | ICD-10-CM

## 2020-03-20 LAB — NOVEL CORONAVIRUS, NAA: SARS-CoV-2, NAA: NOT DETECTED

## 2020-03-20 LAB — SARS-COV-2, NAA 2 DAY TAT

## 2020-03-31 ENCOUNTER — Other Ambulatory Visit: Payer: Managed Care, Other (non HMO)

## 2020-04-17 ENCOUNTER — Telehealth: Payer: Self-pay

## 2020-04-17 NOTE — Telephone Encounter (Signed)
FYI. Patient advised to go to ER to be evaluated.

## 2020-04-17 NOTE — Telephone Encounter (Signed)
Nurse Assessment Nurse: Tresa Endo, RN, [name] Date/Time (Eastern Time): 04/17/2020 10:30:30 AM Confirm and document reason for call. If symptomatic, describe symptoms. ---Caller states she is having chest pain, has been feeling "really off and just no energy". States chest pain is intermittent. Does the patient have any new or worsening symptoms? ---Yes Will a triage be completed? ---Yes Related visit to physician within the last 2 weeks? ---No Does the PT have any chronic conditions? (i.e. diabetes, asthma, this includes High risk factors for pregnancy, etc.) ---Yes List chronic conditions. ---Depression, Idiopathic hypersomnia Is this a behavioral health or substance abuse call? ---No Guidelines Guideline Title Affirmed Question Affirmed Notes Nurse Date/Time (Eastern Time) Chest Pain [1] Chest pain (or "angina") comes and goes AND [2] is happening more often (increasing in frequency) or getting worse (increasing in severity) (Exception: chest pains that last only a few seconds) Tresa Endo, RN, [name] 04/17/2020 10:32:04 AM Disp. Time Miranda [name] Time) Disposition Final User 04/17/2020 10:28:20 AM Send to Urgent [name], [name] PLEASE NOTE: All timestamps contained within this report are represented as [place] Standard Time. CONFIDENTIALTY NOTICE: This fax transmission is intended only for the addressee. It contains information that is legally privileged, confidential or otherwise protected from use or disclosure. If you are not the intended recipient, you are strictly prohibited from reviewing, disclosing, copying using or disseminating any of this information or taking any action in reliance on or regarding this information. If you have received this fax in error, please notify us immediately by telephone so that we can arrange for its return to [place]. Phone: [phone], Toll-Free: [phone], Fax: [phone] Page: 2 of 2 Call Id: [id number] 04/17/2020 10:35:21 AM Go to ED Now Yes Tresa Endo,  RN, [name] Disagree/Comply Comply Caller Understands Yes PreDisposition InappropriateToAsk Care Advice Given Per Guideline GO TO ED NOW: * You need to be seen in the Emergency Department. * Go to the ED at ___________ Hospital. * Leave now. Drive carefully. ANOTHER ADULT SHOULD DRIVE: * It is better and safer if another adult drives instead of you. BRING MEDICINES: * Bring a list of your current medicines when you go to the Emergency Department (ER). CARE ADVICE given per Chest Pain (Adult) guideline. Referrals GO TO FACILITY OTHER - SPECIFY

## 2020-04-18 ENCOUNTER — Emergency Department (HOSPITAL_COMMUNITY): Payer: Managed Care, Other (non HMO)

## 2020-04-18 ENCOUNTER — Other Ambulatory Visit: Payer: Self-pay

## 2020-04-18 ENCOUNTER — Emergency Department (HOSPITAL_COMMUNITY)
Admission: EM | Admit: 2020-04-18 | Discharge: 2020-04-18 | Disposition: A | Discharge status: Home / Self Care | Payer: Managed Care, Other (non HMO) | Attending: Emergency Medicine | Admitting: Emergency Medicine

## 2020-04-18 ENCOUNTER — Encounter (HOSPITAL_COMMUNITY): Payer: Self-pay

## 2020-04-18 DIAGNOSIS — R0789 Other chest pain: Secondary | ICD-10-CM | POA: Insufficient documentation

## 2020-04-18 DIAGNOSIS — R059 Cough, unspecified: Secondary | ICD-10-CM | POA: Insufficient documentation

## 2020-04-18 DIAGNOSIS — M549 Dorsalgia, unspecified: Secondary | ICD-10-CM | POA: Diagnosis not present

## 2020-04-18 DIAGNOSIS — J3489 Other specified disorders of nose and nasal sinuses: Secondary | ICD-10-CM | POA: Insufficient documentation

## 2020-04-18 DIAGNOSIS — R0981 Nasal congestion: Secondary | ICD-10-CM | POA: Insufficient documentation

## 2020-04-18 DIAGNOSIS — R110 Nausea: Secondary | ICD-10-CM | POA: Insufficient documentation

## 2020-04-18 DIAGNOSIS — R630 Anorexia: Secondary | ICD-10-CM | POA: Insufficient documentation

## 2020-04-18 DIAGNOSIS — Z20822 Contact with and (suspected) exposure to covid-19: Secondary | ICD-10-CM | POA: Insufficient documentation

## 2020-04-18 DIAGNOSIS — R61 Generalized hyperhidrosis: Secondary | ICD-10-CM | POA: Diagnosis not present

## 2020-04-18 LAB — HEPATIC FUNCTION PANEL
ALT: 14 U/L (ref 0–44)
AST: 19 U/L (ref 15–41)
Albumin: 4.2 g/dL (ref 3.5–5.0)
Alkaline Phosphatase: 84 U/L (ref 38–126)
Bilirubin, Direct: 0.1 mg/dL (ref 0.0–0.2)
Indirect Bilirubin: 1 mg/dL — ABNORMAL HIGH (ref 0.3–0.9)
Total Bilirubin: 1.1 mg/dL (ref 0.3–1.2)
Total Protein: 7.1 g/dL (ref 6.5–8.1)

## 2020-04-18 LAB — BASIC METABOLIC PANEL
Anion gap: 10 (ref 5–15)
BUN: 19 mg/dL (ref 6–20)
CO2: 26 mmol/L (ref 22–32)
Calcium: 9.8 mg/dL (ref 8.9–10.3)
Chloride: 102 mmol/L (ref 98–111)
Creatinine, Ser: 0.81 mg/dL (ref 0.44–1.00)
GFR, Estimated: >60 mL/min (ref >60)
Glucose, Bld: 91 mg/dL (ref 70–99)
Potassium: 3.9 mmol/L (ref 3.5–5.1)
Sodium: 138 mmol/L (ref 135–145)

## 2020-04-18 LAB — CBC
HCT: 45.5 % (ref 36.0–46.0)
Hemoglobin: 15.2 g/dL — ABNORMAL HIGH (ref 12.0–15.0)
MCH: 31.3 pg (ref 26.0–34.0)
MCHC: 33.4 g/dL (ref 30.0–36.0)
MCV: 93.6 fL (ref 80.0–100.0)
Platelets: 180 10*3/uL (ref 150–400)
RBC: 4.86 MIL/uL (ref 3.87–5.11)
RDW: 12.2 % (ref 11.5–15.5)
WBC: 7 10*3/uL (ref 4.0–10.5)
nRBC: 0 % (ref 0.0–0.2)

## 2020-04-18 LAB — RESP PANEL BY RT-PCR (FLU A&B, COVID) ARPGX2
Influenza A by PCR: NEGATIVE
Influenza B by PCR: NEGATIVE
SARS Coronavirus 2 by RT PCR: NEGATIVE

## 2020-04-18 LAB — TROPONIN I (HIGH SENSITIVITY)
Troponin I (High Sensitivity): <2 ng/L (ref <18)
Troponin I (High Sensitivity): <2 ng/L (ref <18)

## 2020-04-18 LAB — D-DIMER, QUANTITATIVE: D-Dimer, Quant: <0.27 ug/mL-FEU (ref 0.00–0.50)

## 2020-04-18 LAB — LIPASE, BLOOD: Lipase: 27 U/L (ref 11–51)

## 2020-04-18 NOTE — Discharge Instructions (Signed)
Your work-up today was overall reassuring.  I suspect that your recent upper respiratory infection which we approved was not Covid may have caused her to have some musculoskeletal chest wall discomfort.  Your cardiac enzymes were reassuring and your D-dimer to rule out blood clot was negative.  Your x-ray did not show pneumonia or other acute abnormality.  We should feel you are safe to go home.  Please follow-up with your primary doctor and consider following up with cardiology again.  If any symptoms change or worsen, please return to nearest emergency department.

## 2020-04-18 NOTE — ED Triage Notes (Signed)
Pt arrived via POV, c/o intermittent, non reproducible midsternal chest pain and generalized fatigue and little appetite x2 weeks. Endorses some episodes of palpitations and diaphoresis.

## 2020-04-18 NOTE — ED Notes (Signed)
Pt denies current chest pain

## 2020-04-18 NOTE — ED Provider Notes (Signed)
[facility]-EMERGENCY DEPT Provider Note   CSN: [id number] Arrival date & time: 04/18/20  1026     History Chief Complaint  Patient presents with  . Chest Pain  . Weakness    Miranda [name] is a 59 y.o. female.  The history is provided by the patient and medical records. No language interpreter was used.  Chest Pain Pain location:  Substernal area Pain quality: aching and sharp   Pain radiates to:  Upper back Pain severity:  Moderate Onset quality:  Gradual Duration:  1 minute Timing:  Sporadic Progression:  Unchanged Chronicity:  New Relieved by:  Nothing Worsened by:  Nothing Ineffective treatments:  None tried Associated symptoms: back pain, cough, diaphoresis and nausea   Associated symptoms: no abdominal pain, no altered mental status, no anorexia, no fatigue, no fever, no headache, no lower extremity edema, no near-syncope, no numbness, no palpitations, no shortness of breath, no vomiting and no weakness   Cough:    Cough characteristics:  Productive   Sputum characteristics:  Nondescript   Severity:  Mild   Onset quality:  Gradual   Duration:  2 weeks   Timing:  Constant   Progression:  Waxing and waning   Chronicity:  New Risk factors: no coronary artery disease, no diabetes mellitus, no high cholesterol, not female, no prior DVT/PE and no smoking        Past Medical History:  Diagnosis Date  . Abnormal Pap smear of cervix 1996  . Allergy   . Allergy, unspecified not elsewhere classified   . IBS (irritable bowel syndrome)   . Idiopathic hypersomnia   . 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 Atypical chest pain 06/25/2018  . Abnormal stress test 06/25/2018  . 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 Seronegative arthritis 08/29/2014  . Dyspnea 04/14/2013  . Palpitations 04/14/2013  . 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 ALLERGY 05/10/2007    Past Surgical History:  Procedure Laterality Date  . CRYOTHERAPY    . SHOULDER SURGERY     right  . TONSILLECTOMY       OB History    Gravida  0   Para  0   Term  0   Preterm  0   AB  0   Living  0     SAB  0   TAB  0   Ectopic  0   Multiple  0   Live Births           Obstetric Comments  No pregnancy but 1 adopted        Family History  Adopted: Yes  Problem Relation Age of Onset  . Cancer Mother        stomach  . Heart disease Mother   . Heart attack Mother   . Heart disease Father   . Colon cancer Neg Hx     Social History   Tobacco Use  . Smoking status: Never Smoker  . Smokeless tobacco: Never Used  Vaping Use  . Vaping Use: Never used  Substance Use Topics  . Alcohol use: No    Alcohol/week: 0.0 standard drinks  . Drug use: No    Home Medications Prior to Admission medications   Medication Sig Start Date End Date Taking?  Authorizing Provider  amphetamine-dextroamphetamine (ADDERALL XR) 20 MG 24 hr capsule Take 1 capsule (20 mg total) by mouth daily. 03/12/20   Jetty Duhamel D, MD  amphetamine-dextroamphetamine (ADDERALL) 10 MG tablet 1 or 2 daily if needed 03/12/20   Jetty Duhamel D, MD  Armodafinil 250 MG tablet 1 in AM and 1/2 tab at noon 01/09/20   Jetty Duhamel D, MD  escitalopram (LEXAPRO) 20 MG tablet TAKE 1 TABLET(20 MG) BY MOUTH DAILY. 02/27/20   Copland, Gwenlyn Found, MD  estradiol (ESTRACE) 0.5 MG tablet Take 1 tablet (0.5 mg total) by mouth daily. 10/21/19   Romualdo Bolk, MD  Ibuprofen (ADVIL PO) Take by mouth as needed. Reported on 06/27/2015    [provider]  PROMETRIUM 100 MG capsule Take 1 capsule (100 mg total) by mouth daily. 10/21/19   Romualdo Bolk, MD    Allergies    Keflex [cephalexin]  Review of Systems   Review of Systems    Constitutional: Positive for diaphoresis. Negative for chills, fatigue and fever.  HENT: Positive for congestion.   Eyes: Negative for visual disturbance.  Respiratory: Positive for cough and chest tightness. Negative for shortness of breath and stridor.   Cardiovascular: Positive for chest pain. Negative for palpitations, leg swelling and near-syncope.  Gastrointestinal: Positive for nausea. Negative for abdominal pain, anorexia, constipation, diarrhea and vomiting.  Genitourinary: Negative for dysuria, flank pain and frequency.  Musculoskeletal: Positive for back pain. Negative for neck pain (no new neck pain) and neck stiffness.  Skin: Negative for rash and wound.  Neurological: Negative for weakness, light-headedness, numbness and headaches.  Psychiatric/Behavioral: Negative for agitation.  All other systems reviewed and are negative.   Physical Exam Updated Vital Signs BP (!) 126/92 (BP Location: Left Arm)   Pulse 95   Temp 97.8 F (36.6 C) (Oral)   Resp 16   LMP 09/06/2012   SpO2 100%   Physical Exam Vitals and nursing note reviewed.  Constitutional:      General: She is not in acute distress.    Appearance: She is well-developed. She is not ill-appearing, toxic-appearing or diaphoretic.  HENT:     Head: Normocephalic and atraumatic.     Right Ear: External ear normal.     Left Ear: External ear normal.     Nose: Nose normal.     Mouth/Throat:     Pharynx: No oropharyngeal exudate.  Eyes:     Conjunctiva/sclera: Conjunctivae normal.     Pupils: Pupils are equal, round, and reactive to light.  Cardiovascular:     Rate and Rhythm: Normal rate and regular rhythm.     Heart sounds: Normal heart sounds. No murmur heard.   Pulmonary:     Effort: Pulmonary effort is normal. No tachypnea or respiratory distress.     Breath sounds: No stridor. No decreased breath sounds, wheezing, rhonchi or rales.  Abdominal:     General: There is no distension.     Tenderness: There  is no abdominal tenderness. There is no rebound.  Musculoskeletal:     Cervical back: Normal range of motion and neck supple.     Right lower leg: No tenderness. No edema.     Left lower leg: No tenderness. No edema.  Skin:    General: Skin is warm.     Capillary Refill: Capillary refill takes less than 2 seconds.     Coloration: Skin is not pale.     Findings: No erythema or rash.  Neurological:  General: No focal deficit present.     Mental Status: She is alert and oriented to person, place, and time.     Motor: No abnormal muscle tone.     Coordination: Coordination normal.     Deep Tendon Reflexes: Reflexes are normal and symmetric.  Psychiatric:        Mood and Affect: Mood normal.     ED Results / Procedures / Treatments   Labs (all labs ordered are listed, but only abnormal results are displayed) Labs Reviewed  CBC - Abnormal; Notable for the following components:      Result Value   Hemoglobin 15.2 (*)    All other components within normal limits  HEPATIC FUNCTION PANEL - Abnormal; Notable for the following components:   Indirect Bilirubin 1.0 (*)    All other components within normal limits  RESP PANEL BY RT-PCR (FLU A&B, COVID) ARPGX2  BASIC METABOLIC PANEL  LIPASE, BLOOD  D-DIMER, QUANTITATIVE (NOT AT [facility])  TROPONIN I (HIGH SENSITIVITY)  TROPONIN I (HIGH SENSITIVITY)    EKG EKG Interpretation  Date/Time:  Wednesday April 18 2020 10:48:52 EST Ventricular Rate:  79 PR Interval:    QRS Duration: 87 QT Interval:  396 QTC Calculation: 454 R Axis:   42 Text Interpretation: Sinus rhythm Low voltage, precordial leads No prior ECG before today for comparison. No STEMI Confirmed by Theda Belfast ([id number]) on 04/18/2020 11:06:17 AM   Radiology DG Chest 2 View  Result Date: 04/18/2020 CLINICAL DATA:  Chest pain. EXAM: CHEST - 2 VIEW COMPARISON:  May 27, 2018. FINDINGS: The heart size and mediastinal contours are within normal limits. Both lungs are  clear. No pneumothorax or pleural effusion is noted. The visualized skeletal structures are unremarkable. IMPRESSION: No active cardiopulmonary disease. Electronically Signed   By: Lupita Raider M.D.   On: 04/18/2020 11:25    Procedures Procedures (including critical care time)  Medications Ordered in ED Medications - No data to display  ED Course  I have reviewed the triage vital signs and the nursing notes.  Pertinent labs & imaging results that were available during my care of the patient were reviewed by me and considered in my medical decision making (see chart for details).    MDM Rules/Calculators/A&P                          NAKYA WEYAND is a 59 y.o. female with a past medical history significant for chronic cervical spine pain and disease, IBS, and idiopathic hypersomnia who presents with URI symptoms with cough as well as chest pain.  Patient reports that for the last 2 weeks, she has been having a productive cough with no fevers but occasional chills.  She reports has had intermittent diaphoresis with it.  She reports occasional episodes of nausea and decreased appetite but denies any vomiting.  She reports of the last few days she started having intermittent episodes of chest discomfort that seems to last around 1 minute at a time and comes in her central chest and feels more sharp and aching.  She reports she also has some pain in her upper back but she did not describe it as a stabbing through her chest.  She says that she chronically has neck pain which is different from baseline.  She denies a history of DVT or PE and denies any leg pain or leg swelling.  She reports she has taken a home antigen Covid test which was  negative.  She reports some congestion and rhinorrhea.  She denies any hemoptysis.  She reports he does have a nondescript phlegm sputum.  She reports the pain is nonexertional and nonpleuritic.  She reports it comes and goes.  She denies any palpitations at  this time.  She reports no sick contacts.  She denies any abdominal symptoms or abdominal pain.  No other complaints.  On exam, lungs are clear and chest is nontender.  Back is nontender.  Symmetric radial pulses.  No lower extremity tenderness, swelling, and normal pulses in the legs.  Abdomen nontender.  Normal bowel sounds.  EKG shows no STEMI.  Clinically I suspect that with her coughing fits with this URI patient likely has musculoskeletal chest wall pain and patient does think that could be the case.  However, given her age and intermittent description of symptoms, we will get work-up including chest x-ray, labs, repeat Covid test, and a D-dimer.  Her CT from last year showed a normal caliber aorta and without any hypertension or the report that the pain go straight to her back, will hold on initial imaging of the aorta.  Very low suspicion for that at this time.  Anticipate reassessment after work-up.  Patient's work-up is reassuring.  Troponin negative x2 and D-dimer is negative.  X-ray reassuring.  Covid test negative.  Patient is feeling better.  Given her work-up results, anticipate she had musculoskeletal pain after coughing fits from her recent URI that is not Covid.  Patient agrees with discharge home and would like to follow-up with her PCP.  She is encouraged to follow-up with her cardiology as well.  She had no other questions or concerns and was discharged in good condition.   Final Clinical Impression(s) / ED Diagnoses Final diagnoses:  Atypical chest pain    Rx / DC Orders ED Discharge Orders    None     Clinical Impression: 1. Atypical chest pain     Disposition: Discharge  Condition: Good  I have discussed the results, Dx and Tx plan with the pt(& family if present). He/she/they expressed understanding and agree(s) with the plan. Discharge instructions discussed at great length. Strict return precautions discussed and pt &/or family have verbalized understanding  of the instructions. No further questions at time of discharge.    Discharge Medication List as of 04/18/2020  4:11 PM      Follow Up: Pearline Cablesopland, Jessica C, MD [address] [place] [id number] [phone]     [facility] [id number] [place] [id number] [phone]    Your cardiologist        Lilybelle Mayeda, Canary Brimhristopher J, MD 04/18/20 1626

## 2020-05-08 ENCOUNTER — Other Ambulatory Visit: Payer: Self-pay

## 2020-05-08 ENCOUNTER — Other Ambulatory Visit: Payer: Self-pay | Admitting: Obstetrics and Gynecology

## 2020-05-08 MED ORDER — AMPHETAMINE-DEXTROAMPHET ER 20 MG PO CP24
20.0000 mg | ORAL_CAPSULE | Freq: Every day | ORAL | 0 refills | Status: DC
Start: 2020-05-08 — End: 2020-10-09

## 2020-05-08 MED ORDER — AMPHETAMINE-DEXTROAMPHETAMINE 10 MG PO TABS
ORAL_TABLET | ORAL | 0 refills | Status: DC
Start: 2020-05-08 — End: 2020-06-29

## 2020-05-08 NOTE — Telephone Encounter (Signed)
Adderall refills sent to [facility]

## 2020-05-08 NOTE — Telephone Encounter (Signed)
Dr. Maple Hudson please advise on requested refills.  Thank you!

## 2020-05-08 NOTE — Telephone Encounter (Signed)
Med refill request: progesterone/Prometrium  Last AEX:11/2018 with DL Next AEX: not scheduled.  Last MMG (if hormonal med) 01/27/2020- Birads 1, Neg  Refill authorized: Please Advise?  Last Rx filled 09/2019  Call placed to pt. Spoke with pt. Pt states needing Progesterone Rx refill. Pt advised overdue for AEX. Pt advised to make appt to have refill. Pt agreeable. Pt states will call back on 12/15 to make appt with Dr Oscar La or Tresa Endo, NP. Pt verbalized understanding.

## 2020-05-10 MED ORDER — PROMETRIUM 100 MG PO CAPS
100.0000 mg | ORAL_CAPSULE | Freq: Every day | ORAL | 0 refills | Status: DC
Start: 2020-05-10 — End: 2021-01-09

## 2020-05-10 NOTE — Telephone Encounter (Signed)
30 tablets of prometrium sent to her pharmacy to give her time to schedule an annual exam

## 2020-05-10 NOTE — Telephone Encounter (Signed)
Message left to return call to Triage Nurse at [phone].    

## 2020-05-14 NOTE — Telephone Encounter (Signed)
Left detailed message for pt, Ok per DPR. Pt advised to return call to schedule AEX with Dr Oscar La or Tresa Endo, NP for further refills of progesterone.  Encounter closed

## 2020-05-21 ENCOUNTER — Telehealth: Payer: Self-pay

## 2020-05-21 NOTE — Telephone Encounter (Signed)
[employer] requesting generic for Prometrium Caps. Put form in Dr. Jeanice Lim to sign.

## 2020-06-26 ENCOUNTER — Other Ambulatory Visit: Payer: Self-pay

## 2020-06-27 NOTE — Telephone Encounter (Signed)
Last office visit 01/09/20 Requesting Adderall 10 mg refill.    Plan- she will report if recurrent and if it seems medication related      Assessment & Plan Note by Waymon Budge, MD at 01/09/2020 1:59 PM  Author: Waymon Budge, MD Author Type: Physician Filed: 01/09/2020 2:00 PM  Note Status: Written Cosign: Cosign Not Required Encounter Date: 01/09/2020  Problem: Idiopathic hypersomnia  Editor: Waymon Budge, MD (Physician)             Exacerbation of daytime somnolence lately without apparent reason or life change reported. Plan- adderall 10 mg and armodafinil are filled with discussion. Also adding adderall 20 mg XR as a foundation maintenance med as discussed.        Patient Instructions by Waymon Budge, MD at 01/09/2020 1:30 PM  Author: Waymon Budge, MD Author Type: Physician Filed: 01/09/2020 1:48 PM  Note Status: Signed Cosign: Cosign Not Required Encounter Date: 01/09/2020  Editor: Waymon Budge, MD (Physician)             Refilled Aderall 10 mg and added  adderall 20 mg XR to act as a baseline med if helpful, once daily  Refilled armodafinil  Please call if we can help

## 2020-06-29 MED ORDER — AMPHETAMINE-DEXTROAMPHETAMINE 10 MG PO TABS
ORAL_TABLET | ORAL | 0 refills | Status: DC
Start: 2020-06-29 — End: 2020-11-20

## 2020-06-29 NOTE — Telephone Encounter (Signed)
Adderall 10 mg refilled at [facility] 

## 2020-10-09 ENCOUNTER — Telehealth: Payer: Self-pay | Admitting: Internal Medicine

## 2020-10-09 MED ORDER — AMPHETAMINE-DEXTROAMPHET ER 20 MG PO CP24
20.0000 mg | ORAL_CAPSULE | Freq: Every day | ORAL | 0 refills | Status: DC
Start: 2020-10-09 — End: 2020-11-20

## 2020-10-09 NOTE — Telephone Encounter (Signed)
Adderall refilled at her [facility]

## 2020-10-09 NOTE — Telephone Encounter (Signed)
Attempted to call pt to let her know that [facility] refilled her Adderall Rx for her but unable to reach. Left pt a detailed message on machine letting her know that it was refilled for her. Nothing furhter needed.

## 2020-10-09 NOTE — Telephone Encounter (Signed)
Called and spoke with pt who is requesting a refill of her Adderall 24hr tablet. Pharmacy that this needs to be sent to is [facility] off [address]. Dr. Maple Hudson, please advise.  Allergies  Allergen Reactions  . Keflex [Cephalexin] Itching     Current Outpatient Medications:  .  amphetamine-dextroamphetamine (ADDERALL XR) 20 MG 24 hr capsule, Take 1 capsule (20 mg total) by mouth daily., Disp: 30 capsule, Rfl: 0 .  amphetamine-dextroamphetamine (ADDERALL) 10 MG tablet, 1 or 2 daily if needed, Disp: 60 tablet, Rfl: 0 .  Armodafinil 250 MG tablet, 1 in AM and 1/2 tab at noon (Patient taking differently: Take 125-250 mg by mouth See admin instructions. 1 tab in AM (250mg )  and 1/2 tab (125mg ) at noon), Disp: 45 tablet, Rfl: 5 .  escitalopram (LEXAPRO) 20 MG tablet, TAKE 1 TABLET(20 MG) BY MOUTH DAILY. (Patient taking differently: Take 20 mg by mouth daily. TAKE 1 TABLET(20 MG) BY MOUTH DAILY.), Disp: 90 tablet, Rfl: 3 .  estradiol (ESTRACE) 0.5 MG tablet, Take 1 tablet (0.5 mg total) by mouth daily. (Patient not taking: Reported on 04/18/2020), Disp: 90 tablet, Rfl: 0 .  Estradiol-Norethindrone Acet 0.5-0.1 MG tablet, Take 1 tablet by mouth See admin instructions. Taking 1 tablet daily 6 times weekly, Disp: , Rfl:  .  Ibuprofen (ADVIL PO), Take 200 mg by mouth as needed (pain). , Disp: , Rfl:  .  PROMETRIUM 100 MG capsule, Take 1 capsule (100 mg total) by mouth daily., Disp: 30 capsule, Rfl: 0

## 2020-11-20 ENCOUNTER — Other Ambulatory Visit: Payer: Self-pay

## 2020-11-20 MED ORDER — AMPHETAMINE-DEXTROAMPHET ER 20 MG PO CP24
20.0000 mg | ORAL_CAPSULE | Freq: Every day | ORAL | 0 refills | Status: DC
Start: 2020-11-20 — End: 2021-01-08

## 2020-11-20 MED ORDER — AMPHETAMINE-DEXTROAMPHETAMINE 10 MG PO TABS
ORAL_TABLET | ORAL | 0 refills | Status: DC
Start: 2020-11-20 — End: 2021-01-08

## 2020-11-20 NOTE — Telephone Encounter (Signed)
Adderall scripts refilled 

## 2020-11-20 NOTE — Telephone Encounter (Signed)
Received faxed refill request from pharmacy  Medication name/strength/dose: Adderall XR 20 mg and Adderall 10 mg Medication last rx'd: 10/09/2020 and 06/29/2020 Quantity and number of refills last rx'd: #30 with no refills and #60 with no refills Instructions: take 1 capsule (20 mg) daily and take 1 or 2 daily if needed  Last OV: 01/09/2020 Next OV: 01/08/2021  [facility] please advise on refill request  Allergies  Allergen Reactions   Keflex [Cephalexin] Itching   Current Outpatient Medications on File Prior to Visit  Medication Sig Dispense Refill   amphetamine-dextroamphetamine (ADDERALL XR) 20 MG 24 hr capsule Take 1 capsule (20 mg total) by mouth daily. 30 capsule 0   amphetamine-dextroamphetamine (ADDERALL) 10 MG tablet 1 or 2 daily if needed 60 tablet 0   Armodafinil 250 MG tablet 1 in AM and 1/2 tab at noon (Patient taking differently: Take 125-250 mg by mouth See admin instructions. 1 tab in AM (250mg )  and 1/2 tab (125mg ) at noon) 45 tablet 5   escitalopram (LEXAPRO) 20 MG tablet TAKE 1 TABLET(20 MG) BY MOUTH DAILY. (Patient taking differently: Take 20 mg by mouth daily. TAKE 1 TABLET(20 MG) BY MOUTH DAILY.) 90 tablet 3   estradiol (ESTRACE) 0.5 MG tablet Take 1 tablet (0.5 mg total) by mouth daily. (Patient not taking: Reported on 04/18/2020) 90 tablet 0   Estradiol-Norethindrone Acet 0.5-0.1 MG tablet Take 1 tablet by mouth See admin instructions. Taking 1 tablet daily 6 times weekly     Ibuprofen (ADVIL PO) Take 200 mg by mouth as needed (pain).      PROMETRIUM 100 MG capsule Take 1 capsule (100 mg total) by mouth daily. 30 capsule 0   No current facility-administered medications on file prior to visit.

## 2021-01-07 NOTE — Progress Notes (Signed)
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 NPSG 12/19/01- AHI 0, no snoring, 24% REM, 88.7% sleep efficiency MSLT- 07/05/03- nonspecific hypersomnia, mean latency 3.5 minutes, No SOREM PFT 07/23/2018- minimal obstruction, overinflation, airtrapping, normal Diffusion, no response to BD -----------------------------------------------------------------------------------------------   01/09/20-  60 year old female never smoker followed for Idiopathic Hypersomnia with insomnia complicated by seronegative RA, IBS, idiopathic hypersomnia Armodafinil, adderall 10 Unusually tired in last few months with no evident change in what feels like adequate sleep. No adverse effects from these meds.  Denies changes in general help  01/08/21-  60 year old female never smoker followed for Idiopathic Hypersomnia with insomnia complicated by seronegative RA, IBS, idiopathic hypersomnia -Armodafinil, adderall 10 1-2 daily, Adderall XR 20 1 daily,  Covid vax 4 [employer] Uses armodafinil occ but didn't like the way she felt taking it on top of adderall. Naps occ when she can. Denies other health changes.  ROS-see HPI   + = positive Constitutional:    weight loss, night sweats, fevers, chills, + fatigue, lassitude. HEENT:    headaches, difficulty swallowing, tooth/dental problems, sore throat,       sneezing, itching, ear ache, nasal congestion, post nasal drip, snoring CV:    chest pain, orthopnea, PND, swelling in lower extremities, anasarca,                              dizziness, palpitations Resp:   shortness of breath with exertion or at rest.                productive cough,   non-productive cough, coughing up of blood.              change in color of mucus.  wheezing.   Skin:    rash or lesions. GI:  No-   heartburn, indigestion, abdominal pain, nausea, vomiting,e GU: . MS:   joint pain, stiffness, . Neuro-    + auditory hallucination Psych:   change in mood or affect.  depression or anxiety.   memory loss.  OBJ- Physical Exam General- Alert, Oriented, Affect-appropriate, Distress- none acute, looks well Skin- rash-none, lesions- none, excoriation- none Lymphadenopathy- none Head- atraumatic            Eyes- Gross vision intact, PERRLA, conjunctivae and secretions clear            Ears- Hearing, canals-normal            Nose- Clear, no-Septal dev, mucus, polyps, erosion, perforation             Throat- Mallampati II , mucosa clear , drainage- none, tonsils- atrophic Neck- flexible , trachea midline, no stridor , thyroid nl, carotid no bruit Chest - symmetrical excursion , unlabored           Heart/CV- RRR , no murmur , no gallop  , no rub, nl s1 s2                           - JVD- none , edema- none, stasis changes- none, varices- none           Lung- clear to P&A, wheeze- none, cough- none , dullness-none, rub- none           Chest wall-  Abd-  Br/ Gen/ Rectal- Not done, not indicated Extrem- cyanosis- none, clubbing, none, atrophy- none, strength- nl Neuro- grossly intact to observation

## 2021-01-08 ENCOUNTER — Other Ambulatory Visit: Payer: Self-pay

## 2021-01-08 ENCOUNTER — Ambulatory Visit: Payer: BC Managed Care – PPO | Admitting: Internal Medicine

## 2021-01-08 ENCOUNTER — Encounter: Payer: Self-pay | Admitting: Internal Medicine

## 2021-01-08 DIAGNOSIS — G4711 Idiopathic hypersomnia with long sleep time: Secondary | ICD-10-CM

## 2021-01-08 MED ORDER — AMPHETAMINE-DEXTROAMPHET ER 20 MG PO CP24
20.0000 mg | ORAL_CAPSULE | Freq: Every day | ORAL | 0 refills | Status: DC
Start: 2021-01-08 — End: 2021-03-20

## 2021-01-08 MED ORDER — AMPHETAMINE-DEXTROAMPHETAMINE 10 MG PO TABS
ORAL_TABLET | ORAL | 0 refills | Status: DC
Start: 2021-01-08 — End: 2021-03-20

## 2021-01-08 NOTE — Assessment & Plan Note (Signed)
Has found comfortable, effective range for current meds. Plan- continue Adderall- refills today. Watch need for armodafinil. Continue attention to sleep hygiene.

## 2021-01-08 NOTE — Progress Notes (Addendum)
[name] [address], [address] [place], [place] [address] [phone]  Date:  01/09/2021   Name:  Miranda [name]   DOB:  [birth year]   MRN:  [id number]  PCP:  Miranda Cables, MD    Chief Complaint: Annual Exam   History of Present Illness:  Miranda [name] is a 59 y.o. very pleasant female patient who presents with the following:  Here today for physical exam.  Most recent visit with myself about 1 year ago  History of seronegative arthritis, connective tissue disease, difficulty with sleep and hypersomnia, mood disorder, cervical spine disease, palpitations  She was seen in the ER November with atypical chest pain-she was evaluated and released to home, chest pain thought due to to musculoskeletal soreness from coughing  She is followed by pulmonology, Dr. Maple Hudson for hypersomnia-he is treating her with Adderall at this time ?  Rheumatology- not being seen right now   She does use flexeril on occasion for neck spasms- ortho was rx for her. I can refill  Pap smear due for update- per [facility] COVID-19 booster- done per pt  Shingrix second dose can be given today Mammogram September 2021 Can offer DEXA scan- she will consult with GYN Can update labs today- she is not fasting   She is on lexapro- she did run out for a little while during a pharmacy change but overall she is satisfied with it and notes that her depression sx are well controlled   She tries to exercise when she can- she likes lifting weights at the gyn  No tobacco, rare etoh Patient Active Problem List   Diagnosis Date Noted   Atypical chest pain 06/25/2018   Abnormal stress test 06/25/2018   Auditory hallucination 04/01/2016   Seronegative arthritis 08/29/2014   Dyspnea 04/14/2013   Palpitations 04/14/2013   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   ALLERGY 05/10/2007    Past Medical History:  Diagnosis Date   Abnormal Pap smear of cervix 1996   Allergy    Allergy, unspecified not elsewhere classified    IBS (irritable bowel syndrome)    Idiopathic hypersomnia    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    Past Surgical History:  Procedure Laterality Date   CRYOTHERAPY     SHOULDER SURGERY     right   TONSILLECTOMY      Social History   Tobacco Use   Smoking status: Never   Smokeless tobacco: Never  Vaping Use   Vaping Use: Never used  Substance Use Topics   Alcohol use: No    Alcohol/week: 0.0 standard drinks   Drug use: No    Family History  Adopted: Yes  Problem Relation Age of Onset   Cancer Mother        stomach   Heart disease Mother    Heart attack Mother    Heart disease Father    Colon cancer Neg Hx     Allergies  Allergen Reactions   Keflex [Cephalexin] Itching    Medication list has been reviewed and updated.  Current Outpatient Medications on File Prior to Visit  Medication Sig Dispense Refill   amphetamine-dextroamphetamine (ADDERALL XR) 20 MG 24 hr capsule Take 1 capsule (20 mg total) by mouth daily. 30 capsule 0   amphetamine-dextroamphetamine (  ADDERALL) 10 MG tablet 1 or 2 daily if needed 60 tablet 0   Armodafinil 250 MG tablet 1 in AM and 1/2 tab at noon (Patient taking differently: Take 125-250 mg by mouth See admin instructions. 1 tab in AM (250mg )  and 1/2 tab (125mg ) at noon) 45 tablet 5   escitalopram (LEXAPRO) 20 MG tablet TAKE 1 TABLET(20 MG) BY MOUTH DAILY. (Patient taking differently: Take 20 mg by mouth daily. TAKE 1 TABLET(20 MG) BY MOUTH DAILY.) 90 tablet 3   Ibuprofen (ADVIL PO) Take 200 mg by mouth as needed (pain).      No current facility-administered medications on file prior to visit.    Review of Systems:  As per HPI- otherwise negative.   Physical Examination: Vitals:    01/09/21 0931  BP: 112/72  Pulse: 97  Resp: 15  Temp: (!) 97.2 F (36.2 C)  SpO2: 98%   Vitals:   01/09/21 0931  Weight: 140 lb (63.5 kg)  Height: 5\' 4"  (1.626 m)   Body mass index is 24.03 kg/m. Ideal Body Weight: Weight in (lb) to have BMI = 25: 145.3  GEN: no acute distress. Normal weight, looks well  HEENT: Atraumatic, Normocephalic.  Bilateral TM wnl, oropharynx normal.  PEERL,EOMI.   Ears and Nose: No external deformity. CV: RRR, No M/G/R. No JVD. No thrill. No extra heart sounds. PULM: CTA B, no wheezes, crackles, rhonchi. No retractions. No resp. distress. No accessory muscle use. ABD: S, NT, ND, +BS. No rebound. No HSM. EXTR: No c/c/e PSYCH: Normally interactive. Conversant.    Assessment and Plan: Physical exam  Mood disorder (HCC) - Plan: escitalopram (LEXAPRO) 20 MG tablet  Seronegative arthritis  Screening for thyroid disorder - Plan: TSH  Screening for diabetes mellitus - Plan: Comprehensive metabolic panel, Hemoglobin A1c  Screening for hyperlipidemia - Plan: Lipid panel  Screening for deficiency anemia - Plan: CBC  Fatigue, unspecified type - Plan: TSH, VITAMIN D 25 Hydroxy (Vit-D Deficiency, Fractures)  Neck muscle spasm - Plan: cyclobenzaprine (FLEXERIL) 10 MG tablet  Immunization due - Plan: Varicella-zoster vaccine IM (Shingrix) Physical exam today Encouraged healthy diet and exercise routine Refilled lexapro 2nd dose shingrix  This visit occurred during the SARS-CoV-2 public health emergency.  Safety protocols were in place, including screening questions prior to the visit, additional usage of staff PPE, and extensive cleaning of exam room while observing appropriate contact time as indicated for disinfecting solutions.   Signed 01/11/21, MD  Received labs as below, message to pt  Results for orders placed or performed in visit on 01/09/21  CBC  Result Value Ref Range   WBC 6.8 4.0 - 10.5 K/uL   RBC 4.64 3.87 - 5.11 Mil/uL    Platelets 189.0 150.0 - 400.0 K/uL   Hemoglobin 14.5 12.0 - 15.0 g/dL   HCT Abbe [place] - 01/11/21 %   MCV 93.3 78.0 - 100.0 fl   MCHC 33.4 30.0 - 36.0 g/dL   RDW 17.6 16.0 - 73.7 %  Comprehensive metabolic panel  Result Value Ref Range   Sodium 139 135 - 145 mEq/L   Potassium 5.2 No hemolysis seen (H) 3.5 - 5.1 mEq/L   Chloride 100 96 - 112 mEq/L   CO2 33 (H) 19 - 32 mEq/L   Glucose, Bld 67 (L) 70 - 99 mg/dL   BUN 20 6 - 23 mg/dL   Creatinine, Ser 10.6 0.40 - 1.20 mg/dL   Total Bilirubin 0.7 0.2 - 1.2 mg/dL   Alkaline Phosphatase 97  39 - 117 U/L   AST 19 0 - 37 U/L   ALT 14 0 - 35 U/L   Total Protein 6.6 6.0 - 8.3 g/dL   Albumin 4.1 3.5 - 5.2 g/dL   GFR 61.95 (L) >09.32 mL/min   Calcium 10.0 8.4 - 10.5 mg/dL  Hemoglobin I7T  Result Value Ref Range   Hgb A1c MFr Bld 5.9 4.6 - 6.5 %  Lipid panel  Result Value Ref Range   Cholesterol 185 0 - 200 mg/dL   Triglycerides 24.5 0.0 - 149.0 mg/dL   HDL 80.99 >83.38 mg/dL   VLDL 25.0 0.0 - 53.9 mg/dL   LDL Cholesterol 767 (H) 0 - 99 mg/dL   Total CHOL/HDL Ratio 3    NonHDL 119.30   TSH  Result Value Ref Range   TSH 2.99 0.35 - 5.50 uIU/mL  VITAMIN D 25 Hydroxy (Vit-D Deficiency, Fractures)  Result Value Ref Range   VITD 38.91 30.00 - 100.00 ng/mL    The 10-year ASCVD risk score Denman George DC Jr., et al., 2013) is: 2.1%   Values used to calculate the score:     Age: 57 years     Sex: Female     Is Non-Hispanic African American: No     Diabetic: No     Tobacco smoker: No     Systolic Blood Pressure: 112 mmHg     Is BP treated: No     HDL Cholesterol: 65.3 mg/dL     Total Cholesterol: 185 mg/dL

## 2021-01-08 NOTE — Patient Instructions (Signed)
Adderall refills sent  Please call if we can help

## 2021-01-09 ENCOUNTER — Ambulatory Visit (INDEPENDENT_AMBULATORY_CARE_PROVIDER_SITE_OTHER): Payer: BC Managed Care – PPO | Admitting: Family Medicine

## 2021-01-09 ENCOUNTER — Encounter: Payer: Self-pay | Admitting: Family Medicine

## 2021-01-09 VITALS — BP 112/72 | HR 97 | Temp 97.2°F | Resp 15 | Ht 64.0 in | Wt 140.0 lb

## 2021-01-09 DIAGNOSIS — Z23 Encounter for immunization: Secondary | ICD-10-CM | POA: Diagnosis not present

## 2021-01-09 DIAGNOSIS — F39 Unspecified mood [affective] disorder: Secondary | ICD-10-CM | POA: Diagnosis not present

## 2021-01-09 DIAGNOSIS — M62838 Other muscle spasm: Secondary | ICD-10-CM

## 2021-01-09 DIAGNOSIS — Z130 Encounter for screening for diseases of the blood and blood-forming organs and certain disorders involving the immune mechanism: Secondary | ICD-10-CM | POA: Diagnosis not present

## 2021-01-09 DIAGNOSIS — Z1329 Encounter for screening for other suspected endocrine disorder: Secondary | ICD-10-CM | POA: Diagnosis not present

## 2021-01-09 DIAGNOSIS — R5383 Other fatigue: Secondary | ICD-10-CM | POA: Diagnosis not present

## 2021-01-09 DIAGNOSIS — M1380 Other specified arthritis, unspecified site: Secondary | ICD-10-CM

## 2021-01-09 DIAGNOSIS — Z0000 Encounter for general adult medical examination without abnormal findings: Secondary | ICD-10-CM

## 2021-01-09 DIAGNOSIS — Z131 Encounter for screening for diabetes mellitus: Secondary | ICD-10-CM | POA: Diagnosis not present

## 2021-01-09 DIAGNOSIS — Z13220 Encounter for screening for lipoid disorders: Secondary | ICD-10-CM

## 2021-01-09 LAB — COMPREHENSIVE METABOLIC PANEL
ALT: 14 U/L (ref 0–35)
AST: 19 U/L (ref 0–37)
Albumin: 4.1 g/dL (ref 3.5–5.2)
Alkaline Phosphatase: 97 U/L (ref 39–117)
BUN: 20 mg/dL (ref 6–23)
CO2: 33 mEq/L — ABNORMAL HIGH (ref 19–32)
Calcium: 10 mg/dL (ref 8.4–10.5)
Chloride: 100 mEq/L (ref 96–112)
Creatinine, Ser: 1.2 mg/dL (ref 0.40–1.20)
GFR: 49.18 mL/min — ABNORMAL LOW (ref >60.00)
Glucose, Bld: 67 mg/dL — ABNORMAL LOW (ref 70–99)
Potassium: 5.2 mEq/L — ABNORMAL HIGH (ref 3.5–5.1)
Sodium: 139 mEq/L (ref 135–145)
Total Bilirubin: 0.7 mg/dL (ref 0.2–1.2)
Total Protein: 6.6 g/dL (ref 6.0–8.3)

## 2021-01-09 LAB — CBC
HCT: 43.3 % (ref 36.0–46.0)
Hemoglobin: 14.5 g/dL (ref 12.0–15.0)
MCHC: 33.4 g/dL (ref 30.0–36.0)
MCV: 93.3 fl (ref 78.0–100.0)
Platelets: 189 10*3/uL (ref 150.0–400.0)
RBC: 4.64 Mil/uL (ref 3.87–5.11)
RDW: 13 % (ref 11.5–15.5)
WBC: 6.8 10*3/uL (ref 4.0–10.5)

## 2021-01-09 LAB — LIPID PANEL
Cholesterol: 185 mg/dL (ref 0–200)
HDL: 65.3 mg/dL (ref >39.00)
LDL Cholesterol: 102 mg/dL — ABNORMAL HIGH (ref 0–99)
NonHDL: 119.3
Total CHOL/HDL Ratio: 3
Triglycerides: 88 mg/dL (ref 0.0–149.0)
VLDL: 17.6 mg/dL (ref 0.0–40.0)

## 2021-01-09 LAB — HEMOGLOBIN A1C: Hgb A1c MFr Bld: 5.9 % (ref 4.6–6.5)

## 2021-01-09 LAB — TSH: TSH: 2.99 u[IU]/mL (ref 0.35–5.50)

## 2021-01-09 LAB — VITAMIN D 25 HYDROXY (VIT D DEFICIENCY, FRACTURES): VITD: 38.91 ng/mL (ref 30.00–100.00)

## 2021-01-09 MED ORDER — ESCITALOPRAM OXALATE 20 MG PO TABS: ORAL_TABLET | ORAL | 3 refills | Status: DC

## 2021-01-09 MED ORDER — CYCLOBENZAPRINE HCL 10 MG PO TABS: 10.0000 mg | ORAL_TABLET | Freq: Two times a day (BID) | ORAL | 0 refills | Status: DC | PRN

## 2021-01-09 NOTE — Patient Instructions (Signed)
It was good to see you again today!  I will be in touch with your labs 2nd dose of shingrix today

## 2021-01-29 ENCOUNTER — Other Ambulatory Visit: Payer: Self-pay

## 2021-01-29 DIAGNOSIS — N289 Disorder of kidney and ureter, unspecified: Secondary | ICD-10-CM

## 2021-02-01 ENCOUNTER — Other Ambulatory Visit (INDEPENDENT_AMBULATORY_CARE_PROVIDER_SITE_OTHER): Payer: BC Managed Care – PPO

## 2021-02-01 ENCOUNTER — Other Ambulatory Visit: Payer: Self-pay

## 2021-02-01 DIAGNOSIS — N289 Disorder of kidney and ureter, unspecified: Secondary | ICD-10-CM | POA: Diagnosis not present

## 2021-02-01 LAB — BASIC METABOLIC PANEL
BUN: 19 mg/dL (ref 6–23)
CO2: 33 mEq/L — ABNORMAL HIGH (ref 19–32)
Calcium: 9.4 mg/dL (ref 8.4–10.5)
Chloride: 100 mEq/L (ref 96–112)
Creatinine, Ser: 1.12 mg/dL (ref 0.40–1.20)
GFR: 53.4 mL/min — ABNORMAL LOW (ref >60.00)
Glucose, Bld: 68 mg/dL — ABNORMAL LOW (ref 70–99)
Potassium: 4.7 mEq/L (ref 3.5–5.1)
Sodium: 138 mEq/L (ref 135–145)

## 2021-02-02 ENCOUNTER — Encounter: Payer: Self-pay | Admitting: Family Medicine

## 2021-03-08 ENCOUNTER — Other Ambulatory Visit: Payer: Self-pay | Admitting: Internal Medicine

## 2021-03-12 NOTE — Telephone Encounter (Signed)
Armodafinil refilled 

## 2021-03-12 NOTE — Telephone Encounter (Unsigned)
Hello Dr.Young are you able to refill this medication?

## 2021-03-20 ENCOUNTER — Other Ambulatory Visit: Payer: Self-pay

## 2021-03-20 MED ORDER — AMPHETAMINE-DEXTROAMPHETAMINE 10 MG PO TABS
ORAL_TABLET | ORAL | 0 refills | Status: DC
Start: 2021-03-20 — End: 2021-04-03

## 2021-03-20 MED ORDER — AMPHETAMINE-DEXTROAMPHET ER 20 MG PO CP24
20.0000 mg | ORAL_CAPSULE | Freq: Every day | ORAL | 0 refills | Status: DC
Start: 2021-03-20 — End: 2021-05-07

## 2021-03-20 NOTE — Telephone Encounter (Signed)
I called to leave a message and let patient know that her medication refill was sent to the pharmacy. She was asked to call back with any questions. Nothing further needed.

## 2021-03-20 NOTE — Telephone Encounter (Signed)
Please advise if you are okay refilling meds.  Allergies  Allergen Reactions   Keflex [Cephalexin] Itching     Current Outpatient Medications:    Armodafinil 250 MG tablet, TAKE 1 TABLET EVERY MORNING AND ONE-HALF TABLET AT NOON, Disp: 45 tablet, Rfl: 5   amphetamine-dextroamphetamine (ADDERALL XR) 20 MG 24 hr capsule, Take 1 capsule (20 mg total) by mouth daily., Disp: 30 capsule, Rfl: 0   amphetamine-dextroamphetamine (ADDERALL) 10 MG tablet, 1 or 2 daily if needed, Disp: 60 tablet, Rfl: 0   cyclobenzaprine (FLEXERIL) 10 MG tablet, Take 1 tablet (10 mg total) by mouth 2 (two) times daily as needed for muscle spasms., Disp: 30 tablet, Rfl: 0   escitalopram (LEXAPRO) 20 MG tablet, TAKE 1 TABLET(20 MG) BY MOUTH DAILY., Disp: 90 tablet, Rfl: 3   Ibuprofen (ADVIL PO), Take 200 mg by mouth as needed (pain). , Disp: , Rfl:

## 2021-03-20 NOTE — Telephone Encounter (Signed)
Adderall refilled

## 2021-04-03 ENCOUNTER — Other Ambulatory Visit: Payer: Self-pay

## 2021-04-04 MED ORDER — AMPHETAMINE-DEXTROAMPHETAMINE 10 MG PO TABS
ORAL_TABLET | ORAL | 0 refills | Status: DC
Start: 2021-04-04 — End: 2021-07-02

## 2021-04-04 NOTE — Telephone Encounter (Signed)
[facility] pharmacy and was on hold for over 20 minutes, will try again later.

## 2021-04-04 NOTE — Telephone Encounter (Signed)
Hi Dr Maple Hudson would you like to refill pt's RX for Adderall? Last refill was on 10/26. Thanks

## 2021-04-04 NOTE — Telephone Encounter (Signed)
Adderall refilled. This is early. Have her needs changed?

## 2021-04-04 NOTE — Telephone Encounter (Signed)
Pt states she never picked up Adderall on 10/26. Will try to get in touch with the pharmacy to verify.   States there are no changes in her needs.

## 2021-05-07 ENCOUNTER — Other Ambulatory Visit: Payer: Self-pay | Admitting: Internal Medicine

## 2021-05-08 ENCOUNTER — Other Ambulatory Visit: Payer: Self-pay | Admitting: Family Medicine

## 2021-05-08 DIAGNOSIS — F39 Unspecified mood [affective] disorder: Secondary | ICD-10-CM

## 2021-05-08 MED ORDER — AMPHETAMINE-DEXTROAMPHET ER 20 MG PO CP24
20.0000 mg | ORAL_CAPSULE | Freq: Every day | ORAL | 0 refills | Status: DC
Start: 2021-05-08 — End: 2021-07-02

## 2021-05-08 NOTE — Telephone Encounter (Signed)
Dr. Maple Hudson, please advise on refill request.  Allergies  Allergen Reactions   Keflex [Cephalexin] Itching     Current Outpatient Medications:    amphetamine-dextroamphetamine (ADDERALL XR) 20 MG 24 hr capsule, Take 1 capsule (20 mg total) by mouth daily., Disp: 30 capsule, Rfl: 0   Armodafinil 250 MG tablet, TAKE 1 TABLET EVERY MORNING AND ONE-HALF TABLET AT NOON, Disp: 45 tablet, Rfl: 5   amphetamine-dextroamphetamine (ADDERALL) 10 MG tablet, 1 or 2 daily if needed, Disp: 60 tablet, Rfl: 0   cyclobenzaprine (FLEXERIL) 10 MG tablet, Take 1 tablet (10 mg total) by mouth 2 (two) times daily as needed for muscle spasms., Disp: 30 tablet, Rfl: 0   escitalopram (LEXAPRO) 20 MG tablet, TAKE 1 TABLET(20 MG) BY MOUTH DAILY., Disp: 90 tablet, Rfl: 3   Ibuprofen (ADVIL PO), Take 200 mg by mouth as needed (pain). , Disp: , Rfl:

## 2021-05-08 NOTE — Telephone Encounter (Signed)
Adderall refilled

## 2021-06-28 ENCOUNTER — Other Ambulatory Visit: Payer: Self-pay | Admitting: Family Medicine

## 2021-06-28 DIAGNOSIS — Z1231 Encounter for screening mammogram for malignant neoplasm of breast: Secondary | ICD-10-CM

## 2021-07-02 ENCOUNTER — Other Ambulatory Visit: Payer: Self-pay | Admitting: Internal Medicine

## 2021-07-02 MED ORDER — AMPHETAMINE-DEXTROAMPHET ER 20 MG PO CP24: 20.0000 mg | ORAL_CAPSULE | Freq: Every day | ORAL | 0 refills | Status: DC

## 2021-07-02 MED ORDER — AMPHETAMINE-DEXTROAMPHETAMINE 10 MG PO TABS: ORAL_TABLET | ORAL | 0 refills | Status: DC

## 2021-07-02 NOTE — Telephone Encounter (Signed)
Dr. Annamaria Boots, please advise on refill requests.  Allergies  Allergen Reactions   Keflex [Cephalexin] Itching     Current Outpatient Medications:    amphetamine-dextroamphetamine (ADDERALL XR) 20 MG 24 hr capsule, Take 1 capsule (20 mg total) by mouth daily., Disp: 30 capsule, Rfl: 0   Armodafinil 250 MG tablet, TAKE 1 TABLET EVERY MORNING AND ONE-HALF TABLET AT NOON, Disp: 45 tablet, Rfl: 5   amphetamine-dextroamphetamine (ADDERALL) 10 MG tablet, 1 or 2 daily if needed, Disp: 60 tablet, Rfl: 0   cyclobenzaprine (FLEXERIL) 10 MG tablet, Take 1 tablet (10 mg total) by mouth 2 (two) times daily as needed for muscle spasms., Disp: 30 tablet, Rfl: 0   escitalopram (LEXAPRO) 20 MG tablet, TAKE 1 TABLET(20 MG) BY MOUTH DAILY, Disp: 90 tablet, Rfl: 3   Ibuprofen (ADVIL PO), Take 200 mg by mouth as needed (pain). , Disp: , Rfl:

## 2021-07-02 NOTE — Telephone Encounter (Signed)
Adderall refilled

## 2021-07-04 ENCOUNTER — Ambulatory Visit
Admission: RE | Admit: 2021-07-04 | Discharge: 2021-07-04 | Disposition: A | Discharge status: Home / Self Care | Payer: BC Managed Care – PPO | Source: Ambulatory Visit | Attending: Family Medicine | Admitting: Family Medicine

## 2021-07-04 DIAGNOSIS — Z1231 Encounter for screening mammogram for malignant neoplasm of breast: Secondary | ICD-10-CM

## 2021-07-05 ENCOUNTER — Telehealth: Payer: Self-pay | Admitting: Family Medicine

## 2021-07-05 ENCOUNTER — Other Ambulatory Visit: Payer: Self-pay | Admitting: Family Medicine

## 2021-07-05 DIAGNOSIS — R928 Other abnormal and inconclusive findings on diagnostic imaging of breast: Secondary | ICD-10-CM

## 2021-07-16 NOTE — Telephone Encounter (Signed)
Opened in error

## 2021-07-23 ENCOUNTER — Ambulatory Visit: Payer: BC Managed Care – PPO

## 2021-07-23 ENCOUNTER — Ambulatory Visit
Admission: RE | Admit: 2021-07-23 | Discharge: 2021-07-23 | Disposition: A | Discharge status: Home / Self Care | Payer: BC Managed Care – PPO | Source: Ambulatory Visit | Attending: Family Medicine | Admitting: Family Medicine

## 2021-07-23 DIAGNOSIS — N6489 Other specified disorders of breast: Secondary | ICD-10-CM | POA: Diagnosis not present

## 2021-07-23 DIAGNOSIS — R928 Other abnormal and inconclusive findings on diagnostic imaging of breast: Secondary | ICD-10-CM

## 2021-07-23 DIAGNOSIS — R922 Inconclusive mammogram: Secondary | ICD-10-CM | POA: Diagnosis not present

## 2021-07-29 ENCOUNTER — Other Ambulatory Visit: Payer: BC Managed Care – PPO

## 2021-08-20 ENCOUNTER — Other Ambulatory Visit: Payer: Self-pay | Admitting: Internal Medicine

## 2021-08-21 NOTE — Telephone Encounter (Signed)
Comment from pt about Rx: ?Requested Prescriptions ? ? amphetamine-dextroamphetamine (ADDERALL XR) 20 MG 24 hr capsule  ?    ? Sig: Take 1 capsule (20 mg total) by mouth daily.  ? Disp:  30 capsule    Refills:  0  ? Start: 08/20/2021  ? Earliest Fill Date: 08/20/2021  ? Class: Normal  ? Non-formulary  ? Last ordered: 1 month ago by Waymon Budge, MD   ? Patient comment: Prior to the last refill, this medication was 30MG , was is intended to go down to 20MG ?  ?   ?To be filled at: [facility] - [place], [place] - [address] AT [facility] [address]  ?  ? ? ?Dr. #[id number], please advise. ? ? ?Allergies  ?Allergen Reactions  ? Keflex [Cephalexin] Itching  ? ? ? ?Current Outpatient Medications:  ?  amphetamine-dextroamphetamine (ADDERALL XR) 20 MG 24 hr capsule, Take 1 capsule (20 mg total) by mouth daily., Disp: 30 capsule, Rfl: 0 ?  amphetamine-dextroamphetamine (ADDERALL) 10 MG tablet, 1 or 2 daily if needed, Disp: 60 tablet, Rfl: 0 ?  Armodafinil 250 MG tablet, TAKE 1 TABLET EVERY MORNING AND ONE-HALF TABLET AT NOON, Disp: 45 tablet, Rfl: 5 ?  cyclobenzaprine (FLEXERIL) 10 MG tablet, Take 1 tablet (10 mg total) by mouth 2 (two) times daily as needed for muscle spasms., Disp: 30 tablet, Rfl: 0 ?  escitalopram (LEXAPRO) 20 MG tablet, TAKE 1 TABLET(20 MG) BY MOUTH DAILY, Disp: 90 tablet, Rfl: 3 ?  Ibuprofen (ADVIL PO), Take 200 mg by mouth as needed (pain). , Disp: , Rfl:  ? ?

## 2021-08-21 NOTE — Telephone Encounter (Signed)
Called and spoke with pt. Pt stated that she would like to have the Rx changed to 30mg  XR if it could be done. ?

## 2021-08-21 NOTE — Telephone Encounter (Signed)
For at least the past year, all Adderall XR scripts I see were for 20 mg. She wasz supplementing to 10 mg standard release tabs. Does she want the script changed to 30 mg XR ? ?

## 2021-08-22 MED ORDER — AMPHETAMINE-DEXTROAMPHET ER 30 MG PO CP24
30.0000 mg | ORAL_CAPSULE | Freq: Every day | ORAL | 0 refills | Status: DC
Start: 2021-08-22 — End: 2021-10-18

## 2021-08-22 NOTE — Telephone Encounter (Signed)
Adderall script refilled, changing to 30 mg ER strength ?

## 2021-08-26 ENCOUNTER — Ambulatory Visit (HOSPITAL_BASED_OUTPATIENT_CLINIC_OR_DEPARTMENT_OTHER)
Admission: RE | Admit: 2021-08-26 | Discharge: 2021-08-26 | Disposition: A | Discharge status: Home / Self Care | Payer: BC Managed Care – PPO | Source: Ambulatory Visit | Attending: Family Medicine | Admitting: Family Medicine

## 2021-08-26 ENCOUNTER — Ambulatory Visit: Payer: BC Managed Care – PPO | Admitting: Family Medicine

## 2021-08-26 ENCOUNTER — Encounter: Payer: Self-pay | Admitting: Family Medicine

## 2021-08-26 VITALS — BP 110/62 | HR 90 | Temp 97.9°F | Resp 19 | Ht 64.0 in | Wt 152.2 lb

## 2021-08-26 DIAGNOSIS — R052 Subacute cough: Secondary | ICD-10-CM | POA: Diagnosis not present

## 2021-08-26 DIAGNOSIS — R9389 Abnormal findings on diagnostic imaging of other specified body structures: Secondary | ICD-10-CM | POA: Diagnosis not present

## 2021-08-26 DIAGNOSIS — R059 Cough, unspecified: Secondary | ICD-10-CM | POA: Diagnosis not present

## 2021-08-26 LAB — POC COVID19 BINAXNOW: SARS Coronavirus 2 Ag: NEGATIVE

## 2021-08-26 MED ORDER — ALBUTEROL SULFATE HFA 108 (90 BASE) MCG/ACT IN AERS: 2.0000 | INHALATION_SPRAY | Freq: Four times a day (QID) | RESPIRATORY_TRACT | 3 refills | Status: DC | PRN

## 2021-08-26 MED ORDER — AZITHROMYCIN 250 MG PO TABS: ORAL_TABLET | ORAL | 0 refills | Status: AC

## 2021-08-26 NOTE — Patient Instructions (Addendum)
Good to see you today- I will be in touch with your chest x-ray report asap  ?Please let me know if you are not feeling better in the next couple of days- Sooner if worse.  ? ? ?

## 2021-08-26 NOTE — Progress Notes (Signed)
Therapist, [facility] at [facility] ?[address], [address] ?[place], [place] [address] ?336 [phone] ?Fax [phone] ? ?Date:  08/26/2021  ? ?Name:  Miranda [name]   DOB:  04/29/1961   MRN:  [id number] ? ?PCP:  Madine Sarr, Gay Filler, MD  ? ? ?Chief Complaint: Cough (x2 weeks, chest hurts when she coughs-she has some shob, has not taken a covid test. Productive in the mornings. ) ? ? ?History of Present Illness: ? ?Miranda [name] is a 61 y.o. very pleasant female patient who presents with the following: ? ?Pt with history of connective tissue disorder here today with concern of illness  ?Sx started 10- 14 days ago ?She thought it was allergies but she has continued to cough ?She will get SOB when she tries to be active ?Her ribs and chest may hurt with cough ?No vomiting ?She did have a low grade temp recently but has not checked on a regular basis  ?She tested negative for covid at the start of illness but not more recently  ?She has tried allergy meds, robitussin and dayquil  ?Not really helpful  ? ?She does get spring allergies - however she has done shots so her sx are not typically this severe any more  ? ?Patient Active Problem List  ? Diagnosis Date Noted  ? Atypical chest pain 06/25/2018  ? Abnormal stress test 06/25/2018  ? Auditory hallucination 04/01/2016  ? Seronegative arthritis 08/29/2014  ? Dyspnea 04/14/2013  ? Palpitations 04/14/2013  ? 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 Undifferentiated connective tissue disease ([place]) 03/11/2011  ? 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 ALLERGY 05/10/2007  ? ? ?Past Medical History:  ?Diagnosis Date  ? Abnormal Pap smear of cervix 1996  ? Allergy   ? Allergy, unspecified not elsewhere classified   ? IBS (irritable bowel syndrome)   ? Idiopathic hypersomnia   ? RA (rheumatoid arthritis) ([place])   ? 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 ? ?Past Surgical History:  ?Procedure Laterality Date  ?  CRYOTHERAPY    ? SHOULDER SURGERY    ? right  ? TONSILLECTOMY    ? ? ?Social History  ? ?Tobacco Use  ? Smoking status: Never  ? Smokeless tobacco: Never  ?Vaping Use  ? Vaping Use: Never used  ?Substance Use Topics  ? Alcohol use: No  ?  Alcohol/week: 0.0 standard drinks  ? Drug use: No  ? ? ?Family History  ?Adopted: Yes  ?Problem Relation Age of Onset  ? Cancer Mother   ?     stomach  ? Heart disease Mother   ? Heart attack Mother   ? Heart disease Father   ? Colon cancer Neg Hx   ? ? ?Allergies  ?Allergen Reactions  ? Keflex [Cephalexin] Itching  ? ? ?Medication list has been reviewed and updated. ? ?Current Outpatient Medications on File Prior to Visit  ?Medication Sig Dispense Refill  ? amphetamine-dextroamphetamine (ADDERALL XR) 30 MG 24 hr capsule Take 1 capsule (30 mg total) by mouth daily. 31 capsule 0  ? amphetamine-dextroamphetamine (ADDERALL) 10 MG tablet 1 or 2 daily if needed 60 tablet 0  ? Armodafinil 250 MG tablet TAKE 1 TABLET EVERY MORNING AND ONE-HALF TABLET AT NOON 45 tablet 5  ? cyclobenzaprine (FLEXERIL) 10 MG tablet Take 1 tablet (10 mg total) by mouth 2 (two) times daily as needed for muscle spasms. 30 tablet 0  ? escitalopram (LEXAPRO) 20  MG tablet TAKE 1 TABLET(20 MG) BY MOUTH DAILY 90 tablet 3  ? Ibuprofen (ADVIL PO) Take 200 mg by mouth as needed (pain).     ? ?No current facility-administered medications on file prior to visit.  ? ? ?Review of Systems: ? ?As per HPI- otherwise negative. ? ? ?Physical Examination: ?Vitals:  ? 08/26/21 1501  ?BP: 110/62  ?Pulse: 90  ?Resp: 19  ?Temp: 97.9 ?F (36.6 ?C)  ?SpO2: 98%  ? ?Vitals:  ? 08/26/21 1501  ?Weight: 152 lb 3.2 oz (69 kg)  ?Height: 5\' 4"  (1.626 m)  ? ?Body mass index is 26.13 kg/m?. ?Ideal Body Weight: Weight in (lb) to have BMI = 25: 145.3 ? ?GEN: no acute distress.  Looks well, minimal overweight ?HEENT: Atraumatic, Normocephalic.  Bilateral TM wnl, oropharynx normal.  PEERL,EOMI.   ?Ears and Nose: No external deformity. ?CV: RRR, No  M/G/R. No JVD. No thrill. No extra heart sounds. ?PULM: CTA B, no wheezes, crackles, rhonchi. No retractions. No resp. distress. No accessory muscle use. ?ABD: S, NT, ND, +BS. No rebound. No HSM. ?EXTR: No c/c/e ?PSYCH: Normally interactive. Conversant.  ? ?Results for orders placed or performed in visit on 08/26/21  ?POC COVID-19 BinaxNow  ?Result Value Ref Range  ? SARS Coronavirus 2 Ag Negative Negative  ? ? ?Assessment and Plan: ?Subacute cough - Plan: POC COVID-19 BinaxNow, azithromycin (ZITHROMAX) 250 MG tablet, albuterol (VENTOLIN HFA) 108 (90 Base) MCG/ACT inhaler, DG Chest 2 View ? ?Patient seen today with concern of cough for 10 to 14 days.  We will get a chest x-ray today, COVID-19 negative.  She may feel short of breath when she coughs.  I discussed evaluation for pulmonary embolism, potentially starting with a D-dimer today.  Patient declines, she agrees to let me know if she is not feeling better however ? ?We will start on azithromycin, albuterol inhaler. ?Await chest x-ray report ?She will alert [place] if not feeling better in the next couple of days, sooner if worse ? ?Signed ?Lamar Blinks, MD ? ?Received chest x-ray as follows, message to patient ?Ordered repeat chest x-ray to be done at her convenience ? ?DG Chest 2 View ? ?Result Date: 08/26/2021 ?CLINICAL DATA:  Subacute cough.  Cough for 2 weeks. EXAM: CHEST - 2 VIEW COMPARISON:  Prior chest radiographs 04/18/2020 and earlier. FINDINGS: Heart size within normal limits. Apparent 11 mm nodular opacity within the medial right lung base appreciated on the PA radiograph only. No appreciable airspace consolidation elsewhere. No evidence of pleural effusion or pneumothorax. No acute bony abnormality identified. Thoracolumbar dextrocurvature. Impression #1 will be called to the ordering clinician or representative by the Radiologist Assistant, and communication documented in the PACS or [employer]. IMPRESSION: Apparent 11 mm nodular opacity within  the medial right lung base appreciated on the PA radiograph only. This is favored to reflect overlapping pulmonary vasculature and rib. However, a pulmonary nodule cannot be excluded, and short-interval follow-up PA and lateral chest radiographs are recommended. If this finding persists at time of imaging follow-up, a chest CT is recommended for further evaluation. Otherwise, no evidence of acute cardiopulmonary abnormality. Thoracolumbar dextrocurvature. Electronically Signed   By: Kellie Simmering D.O.   On: 08/26/2021 15:55   ? ? ?

## 2021-09-09 ENCOUNTER — Encounter: Payer: Self-pay | Admitting: Family Medicine

## 2021-09-09 ENCOUNTER — Ambulatory Visit (HOSPITAL_BASED_OUTPATIENT_CLINIC_OR_DEPARTMENT_OTHER)
Admission: RE | Admit: 2021-09-09 | Discharge: 2021-09-09 | Disposition: A | Discharge status: Home / Self Care | Payer: BC Managed Care – PPO | Source: Ambulatory Visit | Attending: Family Medicine | Admitting: Family Medicine

## 2021-09-09 DIAGNOSIS — Z0389 Encounter for observation for other suspected diseases and conditions ruled out: Secondary | ICD-10-CM | POA: Diagnosis not present

## 2021-09-09 DIAGNOSIS — R918 Other nonspecific abnormal finding of lung field: Secondary | ICD-10-CM | POA: Diagnosis not present

## 2021-09-09 DIAGNOSIS — R9389 Abnormal findings on diagnostic imaging of other specified body structures: Secondary | ICD-10-CM | POA: Diagnosis not present

## 2021-09-13 ENCOUNTER — Encounter: Payer: Self-pay | Admitting: Family Medicine

## 2021-09-13 NOTE — Patient Instructions (Addendum)
Good to see you again today!   ? ?If your D-dimer test is positive we will plan for a chest CT to rule out a blood clot in the lung ?Assuming this is negative, we will consult with cardiology for shortness of breath.  I will also touch base with gastroenterology, you may benefit from a barium swallow to help determine why you are having difficulty swallowing ? ?Assuming you do not have a pulmonary embolism, we can also move forward with an MRI of your neck if you like ? ?I will be in touch with your labs ?

## 2021-09-13 NOTE — Progress Notes (Addendum)
[facility] ?[address], [address] ?[place], [place] [address] ?336 [phone] ?Fax [phone] ? ?Date:  09/16/2021  ? ?Name:  Miranda [name]   DOB:  [birth year]   MRN:  [id number] ? ?PCP:  Frederika Hukill, Gwenlyn FoundJessica C, MD  ? ? ?Chief Complaint: Dysphagia (X the past several months. Difficulty with swallowing food and choking. Cough lasts several days at a time. Sxs are worse at night if her chin is in more of downward position. She has also been having shortness of breath with little to no exertion.) ? ? ?History of Present Illness: ? ?Miranda [name] is a 61 y.o. very pleasant female patient who presents with the following: ? ?Pt seen today for concern about swallowing ?Last seen by myself earlier this month - history of connective tissue disorder ?From our last visit: ?Patient seen today with concern of cough for 10 to 14 days.  We will get a chest x-ray today, COVID-19 negative.  She may feel short of breath when she coughs.  I discussed evaluation for pulmonary embolism, potentially starting with a D-dimer today.  Patient declines, she agrees to let me know if she is not feeling better however ?  ?We will start on azithromycin, albuterol inhaler. ?Await chest x-ray report ?Her CXR showed a minor abnl which resolved on repeat film ? ?Pap: this is done per GYN, pt reports she is UTD  ? ?She contacted us again last week with the following: ?I wanted to see if I could get your advise. I have pretty significant issues with my neck, I have attached a copy of my last report from 2017. Since that time I have reduced my gym workouts drastically in an effort not to exacerbate the problem,  done PT and dry needling to help relieve the pain. ? Over the past several months I have noticed a difference when I swallow and when I am sleeping at night. It feels like when I swallow it is harder for food to pass the  back of my throat and I get chocked, start coughing and then the cough lasts for  several days.  At night if my chin  or head is in more of a downward position I wake up chocking like the airway has been cut off. I have been having shortness of breath with little to no exertion. ?I have been trying to research to see if this issue are related to my neck and it sound like they may, especially if things have worsened over the past 6 years. ?I wanted to get your advise, if you think this would be related to my neck issues or something else? I am trying to avoid, if at all possible, going in the wrong direction with Dr. or specialist and having excessive cost for tests, etc.  ? ?She notes the cough is much better ?She has notes some choking on food for a year or so ?Getting worse  ?Any type of food can cause the sx, and she might even have difficulty with liquids ?No regurgitation ? ?In addition she has noted SOB- she may feel SOB with exercise ?This is pretty consistent ?No CP   ? ?She did do a stress test in 2020: ?The patient exercised following the Bruce protocol.   ?The patient reported no symptoms during the stress test. The patient experienced no angina during the stress test.  ?The patient requested the test to be stopped.  ?Heart rate demonstrated a normal  response to exercise. Blood pressure demonstrated a blunted response to exercise. Overall, the patient's exercise capacity was excellent.  ?85% of maximum heart rate was achieved after 7.1 minutes.  Recovery time:  5 minutes.  The patient's response to exercise was adequate for diagnosis. ? ?She did have an MRI of her cervical spine in 2017 as follows-she wonders if this could be why she is having difficulty swallowing ? ?Patient Active Problem List  ? Diagnosis Date Noted  ? Atypical chest pain 06/25/2018  ? Abnormal stress test 06/25/2018  ? Auditory hallucination 04/01/2016  ? Seronegative arthritis 08/29/2014  ? Dyspnea 04/14/2013  ? Palpitations 04/14/2013  ? 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 ALLERGY 05/10/2007  ? ? ?Past Medical History:  ?Diagnosis Date  ? Abnormal Pap smear of cervix 1996  ? Allergy   ? Allergy, unspecified not elsewhere classified   ? IBS (irritable bowel syndrome)   ? Idiopathic hypersomnia   ? RA (rheumatoid arthritis) (HCC)   ? 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 ? ?Past Surgical History:  ?Procedure Laterality Date  ? CRYOTHERAPY    ? SHOULDER SURGERY    ? right  ? TONSILLECTOMY    ? ? ?Social History  ? ?Tobacco Use  ? Smoking status: Never  ? Smokeless tobacco: Never  ?Vaping Use  ? Vaping Use: Never used  ?Substance Use Topics  ? Alcohol use: No  ?  Alcohol/week: 0.0 standard drinks  ? Drug use: No  ? ? ?Family History  ?Adopted: Yes  ?Problem Relation Age of Onset  ? Cancer Mother   ?     stomach  ? Heart disease Mother   ? Heart attack Mother   ? Heart disease Father   ? Colon cancer Neg Hx   ? ? ?Allergies  ?Allergen Reactions  ? Keflex [Cephalexin] Itching  ? ? ?Medication list has been reviewed and updated. ? ?Current Outpatient Medications on File Prior to Visit  ?Medication Sig Dispense Refill  ? albuterol (VENTOLIN HFA) 108 (90 Base) MCG/ACT inhaler Inhale 2 puffs into the lungs every 6 (six) hours as needed for wheezing or shortness of breath. 1 each 3  ? amphetamine-dextroamphetamine (ADDERALL XR) 30 MG 24 hr capsule Take 1 capsule (30 mg total) by mouth daily. 31 capsule 0  ? amphetamine-dextroamphetamine (ADDERALL) 10 MG tablet 1 or 2 daily if needed 60 tablet 0  ? Armodafinil 250 MG tablet TAKE 1 TABLET EVERY MORNING AND ONE-HALF TABLET AT NOON 45 tablet 5  ? cyclobenzaprine (FLEXERIL) 10 MG tablet Take 1 tablet (10 mg total) by mouth 2 (two) times daily as needed for muscle spasms. 30 tablet 0  ? escitalopram (LEXAPRO) 20 MG tablet TAKE 1 TABLET(20 MG) BY MOUTH DAILY 90 tablet 3  ? Ibuprofen (ADVIL PO) Take 200 mg by mouth as needed (pain).      ? ?No current facility-administered medications on file prior to visit.  ? ? ?Review of Systems: ? ?As per HPI- otherwise negative. ? ? ?Physical Examination: ?Vitals:  ? 09/16/21 1126  ?BP: 110/60  ?Pulse: 74  ?Resp: 18  ?Temp: 98 ?F (36.7 ?C)  ?SpO2: 97%  ? ?Vitals:  ? 09/16/21 1126  ?Weight: 152 lb 9.6 oz (69.2 kg)  ?Height: 5\' 4"  (1.626 m)  ? ?Body mass index is 26.19 kg/m?. ?Ideal Body Weight: Weight in (lb) to have BMI = 25:  145.3 ? ?GEN: no acute distress.  Minimal overweight, looks well  ?HEENT: Atraumatic, Normocephalic.  ?Ears and Nose: No external deformity. ?CV: RRR, No M/G/R. No JVD. No thrill. No extra heart sounds. ?PULM: CTA B, no wheezes, crackles, rhonchi. No retractions. No resp. distress. No accessory muscle use. ?ABD: S, NT, ND, +BS. No rebound. No HSM. ?EXTR: No c/c/e ?PSYCH: Normally interactive. Conversant.  ? ?EKG: sinus rhythm with low voltage- c/w EKG from 11/21 no significant change noted  ?Assessment and Plan: ?SOB (shortness of breath) - Plan: D-Dimer, Quantitative, EKG 12-Lead, CBC ? ?Low kidney function - Plan: Comprehensive metabolic panel ? ?Screening for diabetes mellitus - Plan: Comprehensive metabolic panel, Hemoglobin A1c ? ?Screening for hyperlipidemia - Plan: Lipid panel ? ?Screening for thyroid disorder - Plan: TSH ? ?Dysphagia, unspecified type ? ?Patient seen today with a couple of concerns ?She notes a problem with swallowing for several months, getting worse.  She notes that both liquids and solids may be difficult to swallow and cause her to choke.  No change with food texture. ? ?I will touch base with her gastroenterologist, this does not sound necessarily like an esophageal stricture.  A barium swallow may be more helpful. ?Certainly it is possible this is related to her cervical spine disease, consider MRI neck if GI does not think this is esophageal ? ?She also notes shortness of breath.  We discussed eating a D-dimer when she had a cough last month-we will obtain  D-dimer today.  If positive plan for CT chest.  If negative we will plan to have her consult with cardiology regarding her shortness of breath ? ?Signed ?Abbe Amsterdam, MD ? ?Received labs as below-D-dimer

## 2021-09-16 ENCOUNTER — Ambulatory Visit: Payer: BC Managed Care – PPO | Admitting: Family Medicine

## 2021-09-16 ENCOUNTER — Encounter: Payer: Self-pay | Admitting: Family Medicine

## 2021-09-16 VITALS — BP 110/60 | HR 74 | Temp 98.0°F | Resp 18 | Ht 64.0 in | Wt 152.6 lb

## 2021-09-16 DIAGNOSIS — R0609 Other forms of dyspnea: Secondary | ICD-10-CM

## 2021-09-16 DIAGNOSIS — Z131 Encounter for screening for diabetes mellitus: Secondary | ICD-10-CM

## 2021-09-16 DIAGNOSIS — Z1329 Encounter for screening for other suspected endocrine disorder: Secondary | ICD-10-CM | POA: Diagnosis not present

## 2021-09-16 DIAGNOSIS — R0602 Shortness of breath: Secondary | ICD-10-CM | POA: Diagnosis not present

## 2021-09-16 DIAGNOSIS — R1310 Dysphagia, unspecified: Secondary | ICD-10-CM

## 2021-09-16 DIAGNOSIS — N289 Disorder of kidney and ureter, unspecified: Secondary | ICD-10-CM

## 2021-09-16 DIAGNOSIS — Z13220 Encounter for screening for lipoid disorders: Secondary | ICD-10-CM | POA: Diagnosis not present

## 2021-09-16 LAB — CBC
HCT: 43.3 % (ref 36.0–46.0)
Hemoglobin: 14.5 g/dL (ref 12.0–15.0)
MCHC: 33.5 g/dL (ref 30.0–36.0)
MCV: 94.4 fl (ref 78.0–100.0)
Platelets: 192 10*3/uL (ref 150.0–400.0)
RBC: 4.58 Mil/uL (ref 3.87–5.11)
RDW: 13 % (ref 11.5–15.5)
WBC: 6.7 10*3/uL (ref 4.0–10.5)

## 2021-09-16 LAB — COMPREHENSIVE METABOLIC PANEL
ALT: 15 U/L (ref 0–35)
AST: 21 U/L (ref 0–37)
Albumin: 4.2 g/dL (ref 3.5–5.2)
Alkaline Phosphatase: 119 U/L — ABNORMAL HIGH (ref 39–117)
BUN: 28 mg/dL — ABNORMAL HIGH (ref 6–23)
CO2: 31 mEq/L (ref 19–32)
Calcium: 9.5 mg/dL (ref 8.4–10.5)
Chloride: 103 mEq/L (ref 96–112)
Creatinine, Ser: 0.9 mg/dL (ref 0.40–1.20)
GFR: 69.12 mL/min (ref >60.00)
Glucose, Bld: 89 mg/dL (ref 70–99)
Potassium: 5.1 mEq/L (ref 3.5–5.1)
Sodium: 140 mEq/L (ref 135–145)
Total Bilirubin: 0.7 mg/dL (ref 0.2–1.2)
Total Protein: 6.5 g/dL (ref 6.0–8.3)

## 2021-09-16 LAB — TSH: TSH: 2.18 u[IU]/mL (ref 0.35–5.50)

## 2021-09-16 LAB — LIPID PANEL
Cholesterol: 203 mg/dL — ABNORMAL HIGH (ref 0–200)
HDL: 64.8 mg/dL (ref >39.00)
LDL Cholesterol: 120 mg/dL — ABNORMAL HIGH (ref 0–99)
NonHDL: 138.11
Total CHOL/HDL Ratio: 3
Triglycerides: 89 mg/dL (ref 0.0–149.0)
VLDL: 17.8 mg/dL (ref 0.0–40.0)

## 2021-09-16 LAB — HEMOGLOBIN A1C: Hgb A1c MFr Bld: 5.9 % (ref 4.6–6.5)

## 2021-09-17 ENCOUNTER — Encounter: Payer: Self-pay | Admitting: Family Medicine

## 2021-09-17 LAB — D-DIMER, QUANTITATIVE: D-Dimer, Quant: 0.28 mcg/mL FEU (ref <0.50)

## 2021-09-17 NOTE — Addendum Note (Signed)
Addended by: Abbe Amsterdam C on: 09/17/2021 01:45 PM ? ? Modules accepted: Orders ? ?

## 2021-09-18 ENCOUNTER — Other Ambulatory Visit (HOSPITAL_COMMUNITY): Payer: Self-pay

## 2021-09-18 DIAGNOSIS — R1310 Dysphagia, unspecified: Secondary | ICD-10-CM

## 2021-09-23 ENCOUNTER — Ambulatory Visit: Payer: BC Managed Care – PPO | Admitting: Family Medicine

## 2021-10-01 ENCOUNTER — Ambulatory Visit (HOSPITAL_COMMUNITY)
Admission: RE | Admit: 2021-10-01 | Discharge: 2021-10-01 | Disposition: A | Discharge status: Home / Self Care | Payer: BC Managed Care – PPO | Source: Ambulatory Visit | Attending: Family Medicine | Admitting: Family Medicine

## 2021-10-01 ENCOUNTER — Other Ambulatory Visit: Payer: Self-pay | Admitting: Family Medicine

## 2021-10-01 ENCOUNTER — Encounter: Payer: Self-pay | Admitting: Family Medicine

## 2021-10-01 ENCOUNTER — Telehealth: Payer: Self-pay | Admitting: Family Medicine

## 2021-10-01 DIAGNOSIS — R1310 Dysphagia, unspecified: Secondary | ICD-10-CM | POA: Diagnosis not present

## 2021-10-01 NOTE — Telephone Encounter (Signed)
[facility] states they would like clarification on which type barium swallow test is needed. She states some of the gi notes are contradicting.  ?

## 2021-10-01 NOTE — Telephone Encounter (Signed)
Please advise 

## 2021-10-04 ENCOUNTER — Encounter: Payer: Self-pay | Admitting: Family Medicine

## 2021-10-04 DIAGNOSIS — R1310 Dysphagia, unspecified: Secondary | ICD-10-CM

## 2021-10-07 ENCOUNTER — Ambulatory Visit: Payer: BC Managed Care – PPO | Admitting: Internal Medicine

## 2021-10-08 ENCOUNTER — Telehealth: Payer: Self-pay

## 2021-10-08 NOTE — Telephone Encounter (Signed)
Authorization: [id number] ?Dates valid: 10/08/21 - 11/07/21 ?

## 2021-10-09 ENCOUNTER — Ambulatory Visit (HOSPITAL_BASED_OUTPATIENT_CLINIC_OR_DEPARTMENT_OTHER)
Admission: RE | Admit: 2021-10-09 | Discharge: 2021-10-09 | Disposition: A | Discharge status: Home / Self Care | Payer: BC Managed Care – PPO | Source: Ambulatory Visit | Attending: Family Medicine | Admitting: Family Medicine

## 2021-10-09 ENCOUNTER — Encounter (HOSPITAL_BASED_OUTPATIENT_CLINIC_OR_DEPARTMENT_OTHER): Payer: Self-pay

## 2021-10-09 DIAGNOSIS — M542 Cervicalgia: Secondary | ICD-10-CM | POA: Diagnosis not present

## 2021-10-09 DIAGNOSIS — R531 Weakness: Secondary | ICD-10-CM | POA: Diagnosis not present

## 2021-10-09 DIAGNOSIS — R1310 Dysphagia, unspecified: Secondary | ICD-10-CM | POA: Diagnosis not present

## 2021-10-09 MED ORDER — IOHEXOL 300 MG/ML  SOLN
100.0000 mL | Freq: Once | INTRAMUSCULAR | Status: AC | PRN
  Administered 2021-10-09: 75 mL via INTRAVENOUS

## 2021-10-10 ENCOUNTER — Encounter: Payer: Self-pay | Admitting: Family Medicine

## 2021-10-17 ENCOUNTER — Ambulatory Visit: Payer: BC Managed Care – PPO | Admitting: Gastroenterology

## 2021-10-18 ENCOUNTER — Other Ambulatory Visit: Payer: Self-pay | Admitting: Internal Medicine

## 2021-10-18 MED ORDER — AMPHETAMINE-DEXTROAMPHETAMINE 10 MG PO TABS: ORAL_TABLET | ORAL | 0 refills | Status: DC

## 2021-10-18 MED ORDER — AMPHETAMINE-DEXTROAMPHET ER 30 MG PO CP24
30.0000 mg | ORAL_CAPSULE | Freq: Every day | ORAL | 0 refills | Status: DC
Start: 2021-10-18 — End: 2021-10-28

## 2021-10-18 NOTE — Telephone Encounter (Signed)
Adderall refilled

## 2021-10-24 ENCOUNTER — Ambulatory Visit (INDEPENDENT_AMBULATORY_CARE_PROVIDER_SITE_OTHER): Payer: BC Managed Care – PPO | Admitting: Radiology

## 2021-10-24 ENCOUNTER — Other Ambulatory Visit (HOSPITAL_COMMUNITY)
Admission: RE | Admit: 2021-10-24 | Discharge: 2021-10-24 | Disposition: A | Discharge status: Home / Self Care | Payer: BC Managed Care – PPO | Source: Ambulatory Visit | Attending: Radiology | Admitting: Radiology

## 2021-10-24 ENCOUNTER — Encounter: Payer: Self-pay | Admitting: Radiology

## 2021-10-24 VITALS — BP 102/66 | Ht 63.0 in | Wt 149.0 lb

## 2021-10-24 DIAGNOSIS — N951 Menopausal and female climacteric states: Secondary | ICD-10-CM

## 2021-10-24 DIAGNOSIS — Z13820 Encounter for screening for osteoporosis: Secondary | ICD-10-CM | POA: Diagnosis not present

## 2021-10-24 DIAGNOSIS — Z01419 Encounter for gynecological examination (general) (routine) without abnormal findings: Secondary | ICD-10-CM

## 2021-10-24 DIAGNOSIS — R32 Unspecified urinary incontinence: Secondary | ICD-10-CM | POA: Diagnosis not present

## 2021-10-24 LAB — URINALYSIS, COMPLETE W/RFL CULTURE
Bacteria, UA: NONE SEEN /HPF
Bilirubin Urine: NEGATIVE
Glucose, UA: NEGATIVE
Hyaline Cast: NONE SEEN /LPF
Ketones, ur: NEGATIVE
Leukocyte Esterase: NEGATIVE
Nitrites, Initial: NEGATIVE
Protein, ur: NEGATIVE
RBC / HPF: NONE SEEN /HPF (ref 0–2)
Specific Gravity, Urine: 1.02 (ref 1.001–1.035)
WBC, UA: NONE SEEN /HPF (ref 0–5)
pH: 5 (ref 5.0–8.0)

## 2021-10-24 LAB — NO CULTURE INDICATED

## 2021-10-24 MED ORDER — ESTRADIOL 0.025 MG/24HR TD PTTW: 1.0000 | MEDICATED_PATCH | TRANSDERMAL | 4 refills | Status: DC

## 2021-10-24 MED ORDER — PROGESTERONE MICRONIZED 100 MG PO CAPS: 100.0000 mg | ORAL_CAPSULE | Freq: Every evening | ORAL | 4 refills | Status: DC

## 2021-10-24 NOTE — Progress Notes (Signed)
Miranda [name] [birth year] [id number]   History: Postmenopausal 60 y.o. presents for annual exam. Last visit here 3 years ago. Her previous PCP stopped her oral HRT 3 years ago siting increased breast cancer risk. Since stopping pt does have hot flashes, trouble sleeping, weight gain around her midsection and significantly less energy. She is interested in restarting.  Over the past year she has also noticed worsening urinary incontinence when she holds her bladder. She is waking 2-3 times at night to void as well. Typically small amounts. She also noted she is having more small, normal consistency BMs, since increasing her protein intake.   Gynecologic History Postmenopausal Last Pap: 2019. Results were: normal Last mammogram: 07/04/21. Results were: normal Last colonoscopy: 12/01/14, repeat 10 years HRT use: stopped 3 years ago, oral HRT. Would like to restart  Obstetric History OB History  Gravida Para Term Preterm AB Living  0 0 0 0 0 0  SAB IAB Ectopic Multiple Live Births  0 0 0 0    Obstetric Comments  No pregnancy but 1 adopted     The following portions of the patient's history were reviewed and updated as appropriate: allergies, current medications, past family history, past medical history, past social history, past surgical history, and problem list.  Review of Systems Pertinent items noted in HPI and remainder of comprehensive ROS otherwise negative.  Past medical history, past surgical history, family history and social history were all reviewed and documented in the EPIC chart.  Exam:  Vitals:   10/24/21 0836  BP: 102/66  Weight: 149 lb (67.6 kg)  Height: 5\' 3"  (1.6 m)   Body mass index is 26.39 kg/m.  General appearance:  Normal Thyroid:  Symmetrical, normal in size, without palpable masses or nodularity. Respiratory  Auscultation:  Clear without wheezing or rhonchi Cardiovascular  Auscultation:  Regular rate, without rubs, murmurs or  gallops  Edema/varicosities:  Not grossly evident Abdominal  Soft,nontender, without masses, guarding or rebound.  Liver/spleen:  No organomegaly noted  Hernia:  None appreciated  Skin  Inspection:  Grossly normal Breasts: Examined lying and sitting.   Right: Without masses, retractions, nipple discharge or axillary adenopathy.   Left: Without masses, retractions, nipple discharge or axillary adenopathy. Genitourinary   Inguinal/mons:  Normal without inguinal adenopathy  External genitalia:  Normal appearing vulva with no masses, tenderness, or lesions  BUS/Urethra/Skene's glands:  Normal  Vagina:  Normal appearing with normal color and discharge, no lesions. Atrophy: mild. Tone slightly decreased.   Cervix:  Normal appearing without discharge or lesions. Os stenotic.  Uterus:  Normal in size, shape and contour.  Midline and mobile, nontender  Adnexa/parametria:     Rt: Normal in size, without masses or tenderness.   Lt: Normal in size, without masses or tenderness.  Anus and perineum: Normal    Patient informed chaperone available to be present for breast and pelvic exam. Patient has requested no chaperone to be present. Patient has been advised what will be completed during breast and pelvic exam.   Assessment/Plan:   1. Well woman exam with routine gynecological exam -Labs with PCP - Cytology - PAP( [place])  2. Urinary incontinence, unspecified type Slight decreased in pelvic tone, discussed pelvic floor exercises Reassured normal u/a - Urinalysis,Complete w/RFL Culture  3. Screening for osteoporosis  - DG Bone Density; Future  4. Vasomotor symptoms due to menopause Restart HRT, will switch to transdermal low dose formula. Benefits and risks discussed in depth with patient.  - estradiol (  VIVELLE-DOT) 0.025 MG/24HR; Place 1 patch onto the skin 2 (two) times a week.  Dispense: 24 patch; Refill: 4 - progesterone (PROMETRIUM) 100 MG capsule; Take 1 capsule (100 mg  total) by mouth at bedtime.  Dispense: 90 capsule; Refill: 4   Discussed SBE, colonoscopy and DEXA screening as directed. Recommend of exercise weekly, including weight bearing exercise. Encouraged the use of seatbelts and sunscreen.  Return in 1 year for annual or sooner prn.  Arlie Solomons B WHNP-BC, 10:01 AM 10/24/2021

## 2021-10-28 ENCOUNTER — Encounter: Payer: Self-pay | Admitting: Internal Medicine

## 2021-10-28 LAB — CYTOLOGY - PAP
Comment: NEGATIVE
Diagnosis: NEGATIVE
High risk HPV: NEGATIVE

## 2021-10-28 MED ORDER — AMPHETAMINE-DEXTROAMPHET ER 30 MG PO CP24: 30.0000 mg | ORAL_CAPSULE | Freq: Every day | ORAL | 0 refills | Status: DC

## 2021-10-28 NOTE — Telephone Encounter (Signed)
Adderall refill sent to [facility] 

## 2021-11-14 ENCOUNTER — Other Ambulatory Visit: Payer: Self-pay | Admitting: Radiology

## 2021-11-14 DIAGNOSIS — Z79890 Hormone replacement therapy (postmenopausal): Secondary | ICD-10-CM

## 2021-11-14 MED ORDER — ESTRADIOL 0.05 MG/24HR TD PTTW: 1.0000 | MEDICATED_PATCH | TRANSDERMAL | 4 refills | Status: DC

## 2021-11-19 DIAGNOSIS — Z79890 Hormone replacement therapy (postmenopausal): Secondary | ICD-10-CM

## 2021-11-20 MED ORDER — ESTRADIOL 0.05 MG/24HR TD PTTW: 1.0000 | MEDICATED_PATCH | TRANSDERMAL | 4 refills | Status: DC

## 2021-12-05 ENCOUNTER — Encounter: Payer: Self-pay | Admitting: Internal Medicine

## 2021-12-05 MED ORDER — ARMODAFINIL 250 MG PO TABS: ORAL_TABLET | ORAL | 5 refills | Status: DC

## 2021-12-05 NOTE — Telephone Encounter (Signed)
Please advise on refill of Armadalofil.  She has an OV on 01/10/22.

## 2021-12-05 NOTE — Telephone Encounter (Signed)
Armodafinil refilled 

## 2021-12-23 ENCOUNTER — Other Ambulatory Visit: Payer: Self-pay | Admitting: Internal Medicine

## 2021-12-23 ENCOUNTER — Other Ambulatory Visit: Payer: Self-pay | Admitting: Family Medicine

## 2021-12-23 DIAGNOSIS — M62838 Other muscle spasm: Secondary | ICD-10-CM

## 2021-12-24 MED ORDER — AMPHETAMINE-DEXTROAMPHET ER 30 MG PO CP24: 30.0000 mg | ORAL_CAPSULE | Freq: Every day | ORAL | 0 refills | Status: DC

## 2021-12-24 MED ORDER — AMPHETAMINE-DEXTROAMPHETAMINE 10 MG PO TABS: ORAL_TABLET | ORAL | 0 refills | Status: DC

## 2021-12-24 MED ORDER — CYCLOBENZAPRINE HCL 10 MG PO TABS: 10.0000 mg | ORAL_TABLET | Freq: Two times a day (BID) | ORAL | 0 refills | Status: DC | PRN

## 2021-12-24 NOTE — Telephone Encounter (Signed)
Adderall scripts refilled 

## 2021-12-24 NOTE — Telephone Encounter (Signed)
Okay to refill muscle relaxer? 

## 2022-01-08 ENCOUNTER — Ambulatory Visit: Payer: BC Managed Care – PPO | Admitting: Internal Medicine

## 2022-01-08 DIAGNOSIS — R7303 Prediabetes: Secondary | ICD-10-CM | POA: Insufficient documentation

## 2022-01-08 NOTE — Patient Instructions (Signed)
It was great to see you again today!  

## 2022-01-08 NOTE — Progress Notes (Deleted)
[facility] [address] [place], [place] [address] [phone]  Date:  01/13/2022   Name:  Miranda [name]   DOB:  1961-05-19   MRN:  [id number]  PCP:  Pearline Cables, MD    Chief Complaint: No chief complaint on file.   History of Present Illness:  Miranda [name] is a 60 y.o. very pleasant female patient who presents with the following:  Patient seen today for physical exam History of seronegative arthritis, connective tissue disease, difficulty with sleep and hypersomnia, mood disorder, cervical spine disease, palpitations, prediabetes  Her pulmonologist Dr. Maple Hudson is treating her hypersomnia with Adderall and Nuvigil She has GYN care, was seen by her nurse practitioner in June; they do have her on HRT with Vivelle-Dot and progesterone   Most recent visit with myself was in April of this year; at that time she was having difficulty with swallowing and some shortness of breath.  She had been choking on food for about a year We got a work-up including swallow study and CT of her neck The plan at that time was for her to see gastroenterology-I am not sure if she ended up being seen  Mammogram, Pap, colon cancer screening all up-to-date  April of this year: CMP, lipid, CBC, A1c, TSH A1c 5.9%, thyroid within normal limits  Adderall Exar 30, Adderall IR 10, Nuvigil Flexeril as needed Lexapro 20 Vivelle-Dot, progesterone  Patient Active Problem List   Diagnosis Date Noted   Atypical chest pain 06/25/2018   Abnormal stress test 06/25/2018   Auditory hallucination 04/01/2016   Seronegative arthritis 08/29/2014   Dyspnea 04/14/2013   Palpitations 04/14/2013   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   ALLERGY 05/10/2007    Past Medical  History:  Diagnosis Date   Abnormal Pap smear of cervix 1996   Allergy    Allergy, unspecified not elsewhere classified    IBS (irritable bowel syndrome)    Idiopathic hypersomnia    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    Past Surgical History:  Procedure Laterality Date   CRYOTHERAPY     SHOULDER SURGERY     right   TONSILLECTOMY      Social History   Tobacco Use   Smoking status: Never   Smokeless tobacco: Never  Vaping Use   Vaping Use: Never used  Substance Use Topics   Alcohol use: No    Alcohol/week: 0.0 standard drinks of alcohol   Drug use: No    Family History  Adopted: Yes  Problem Relation Age of Onset   Cancer Mother        stomach   Heart disease Mother    Heart attack Mother    Heart disease Father    Colon cancer Neg Hx     Allergies  Allergen Reactions   Keflex [Cephalexin] Itching    Medication list has been reviewed and updated.  Current Outpatient Medications on File Prior to Visit  Medication Sig Dispense Refill   amphetamine-dextroamphetamine (ADDERALL XR) 30 MG 24 hr capsule Take 1 capsule (30 mg total) by mouth daily. 31 capsule 0   amphetamine-dextroamphetamine (ADDERALL) 10 MG tablet 1 or 2 daily if needed 60 tablet 0   Armodafinil 250 MG tablet TAKE 1 TABLET EVERY MORNING AND ONE-HALF TABLET AT NOON  45 tablet 5   cyclobenzaprine (FLEXERIL) 10 MG tablet Take 1 tablet (10 mg total) by mouth 2 (two) times daily as needed for muscle spasms. 30 tablet 0   escitalopram (LEXAPRO) 20 MG tablet TAKE 1 TABLET(20 MG) BY MOUTH DAILY 90 tablet 3   estradiol (VIVELLE-DOT) 0.05 MG/24HR patch Place 1 patch (0.05 mg total) onto the skin 2 (two) times a week. 24 patch 4   Ibuprofen (ADVIL PO) Take 200 mg by mouth as needed (pain).      progesterone (PROMETRIUM) 100 MG capsule Take 1 capsule (100 mg total) by mouth at bedtime. 90 capsule 4   No current facility-administered medications on file prior to visit.    Review of  Systems:  As per HPI- otherwise negative.   Physical Examination: There were no vitals filed for this visit. There were no vitals filed for this visit. There is no height or weight on file to calculate BMI. Ideal Body Weight:    GEN: no acute distress. HEENT: Atraumatic, Normocephalic.  Ears and Nose: No external deformity. CV: RRR, No M/G/R. No JVD. No thrill. No extra heart sounds. PULM: CTA B, no wheezes, crackles, rhonchi. No retractions. No resp. distress. No accessory muscle use. ABD: S, NT, ND, +BS. No rebound. No HSM. EXTR: No c/c/e PSYCH: Normally interactive. Conversant.    Assessment and Plan: *** Physical exam today.  Encouraged healthy diet and exercise routine Signed Abbe Amsterdam, MD

## 2022-01-10 ENCOUNTER — Ambulatory Visit: Payer: BC Managed Care – PPO | Admitting: Internal Medicine

## 2022-01-13 ENCOUNTER — Ambulatory Visit (INDEPENDENT_AMBULATORY_CARE_PROVIDER_SITE_OTHER): Payer: BC Managed Care – PPO | Admitting: Family Medicine

## 2022-01-13 DIAGNOSIS — Z538 Procedure and treatment not carried out for other reasons: Secondary | ICD-10-CM

## 2022-01-14 ENCOUNTER — Encounter: Payer: Self-pay | Admitting: Family Medicine

## 2022-01-14 MED ORDER — NIRMATRELVIR/RITONAVIR (PAXLOVID)TABLET: 3.0000 | ORAL_TABLET | Freq: Two times a day (BID) | ORAL | 0 refills | Status: AC

## 2022-01-14 NOTE — Addendum Note (Signed)
Addended by: Pearline Cables on: 01/14/2022 05:49 PM   Modules accepted: Orders

## 2022-01-21 ENCOUNTER — Encounter (HOSPITAL_BASED_OUTPATIENT_CLINIC_OR_DEPARTMENT_OTHER): Payer: Self-pay | Admitting: Emergency Medicine

## 2022-01-21 ENCOUNTER — Other Ambulatory Visit: Payer: Self-pay

## 2022-01-21 ENCOUNTER — Emergency Department (HOSPITAL_BASED_OUTPATIENT_CLINIC_OR_DEPARTMENT_OTHER)
Admission: EM | Admit: 2022-01-21 | Discharge: 2022-01-21 | Disposition: A | Discharge status: Home / Self Care | Payer: BC Managed Care – PPO | Attending: Emergency Medicine | Admitting: Emergency Medicine

## 2022-01-21 DIAGNOSIS — M5441 Lumbago with sciatica, right side: Secondary | ICD-10-CM | POA: Diagnosis not present

## 2022-01-21 DIAGNOSIS — M545 Low back pain, unspecified: Secondary | ICD-10-CM | POA: Insufficient documentation

## 2022-01-21 MED ORDER — METHYLPREDNISOLONE 4 MG PO TBPK: ORAL_TABLET | ORAL | 0 refills | Status: DC

## 2022-01-21 NOTE — ED Triage Notes (Signed)
Right lower back pain radiating to right leg x 2 weeks , worse yesterday . Reports feels numbness to right thigh to lower leg . Ambulatory with obvious discomfort.

## 2022-01-21 NOTE — ED Provider Notes (Signed)
[facility] EMERGENCY DEPARTMENT Provider Note   CSN: [id number] Arrival date & time: 01/21/22  0941     History  Chief Complaint  Patient presents with   Back Pain    Right lower     Miranda [name] is a 61 y.o. female with history of IBS and rheumatoid arthritis who presents the emergency department complaining of lower back pain for 2 weeks.  She states that her pain is worse on the right side, and radiates down into her right leg.  Associated numbness coming from her right thigh to her inner right knee.  States the pain is worse with moving.  She reports having previously seen an orthopedist and having x-rays done, but this was years ago or so.  She denies any symptoms in the left leg or saddle.  Denies any urinary retention, urine or bowel incontinence.  No known injuries.  Is otherwise been feeling well.  No history of IV drug use.   Back Pain Associated symptoms: numbness   Associated symptoms: no abdominal pain, no dysuria and no weakness        Home Medications Prior to Admission medications   Medication Sig Start Date End Date Taking? Authorizing Provider  methylPREDNISolone (MEDROL DOSEPAK) 4 MG TBPK tablet Take per package instructions 01/21/22  Yes Cashtyn Pouliot T, PA-C  amphetamine-dextroamphetamine (ADDERALL XR) 30 MG 24 hr capsule Take 1 capsule (30 mg total) by mouth daily. 12/24/21   Jetty Duhamel D, MD  amphetamine-dextroamphetamine (ADDERALL) 10 MG tablet 1 or 2 daily if needed 12/24/21   Jetty Duhamel D, MD  Armodafinil 250 MG tablet TAKE 1 TABLET EVERY MORNING AND ONE-HALF TABLET AT NOON 12/05/21   Jetty Duhamel D, MD  cyclobenzaprine (FLEXERIL) 10 MG tablet Take 1 tablet (10 mg total) by mouth 2 (two) times daily as needed for muscle spasms. 12/24/21   Copland, Gwenlyn Found, MD  escitalopram (LEXAPRO) 20 MG tablet TAKE 1 TABLET(20 MG) BY MOUTH DAILY 05/08/21   Copland, Gwenlyn Found, MD  estradiol (VIVELLE-DOT) 0.05 MG/24HR patch Place 1 patch (0.05 mg  total) onto the skin 2 (two) times a week. 11/21/21   Chrzanowski, Jami B, NP  Ibuprofen (ADVIL PO) Take 200 mg by mouth as needed (pain).     [provider]  progesterone (PROMETRIUM) 100 MG capsule Take 1 capsule (100 mg total) by mouth at bedtime. 10/24/21   Chrzanowski, Lamona Curl, NP      Allergies    Keflex [cephalexin]    Review of Systems   Review of Systems  Gastrointestinal:  Negative for abdominal pain.  Genitourinary:  Negative for difficulty urinating, dysuria, flank pain and hematuria.  Musculoskeletal:  Positive for back pain.  Neurological:  Positive for numbness. Negative for weakness.  All other systems reviewed and are negative.   Physical Exam Updated Vital Signs BP 117/85 (BP Location: Right Arm)   Pulse 82   Temp 98.7 F (37.1 C) (Oral)   Resp 18   Ht 5\' 4"  (1.626 m)   Wt 68 kg   LMP 09/06/2012   SpO2 97%   BMI 25.75 kg/m  Physical Exam Vitals and nursing note reviewed.  Constitutional:      Appearance: Normal appearance.  HENT:     Head: Normocephalic and atraumatic.  Eyes:     Conjunctiva/sclera: Conjunctivae normal.  Pulmonary:     Effort: Pulmonary effort is normal. No respiratory distress.  Musculoskeletal:     Comments: Full passive ROM of all regions of spine.  Generalized paraspinal muscular tenderness to palpation.  No midline spinal tenderness, step-offs or crepitus.  Strength 5/5 in all extremities.  Reported decreased sensation to the medial right knee.   Skin:    General: Skin is warm and dry.  Neurological:     Mental Status: She is alert.  Psychiatric:        Mood and Affect: Mood normal.        Behavior: Behavior normal.     ED Results / Procedures / Treatments   Labs (all labs ordered are listed, but only abnormal results are displayed) Labs Reviewed - No data to display  EKG None  Radiology No results found.  Procedures Procedures    Medications Ordered in ED Medications - No data to display  ED Course/  Medical Decision Making/ A&P                           Medical Decision Making This patient is a 61 y.o. female  who presents to the ED for concern of low back pain x 2 weeks. No urinary symptoms. No history of IV drug use.   Differential diagnoses prior to evaluation: The emergent differential diagnosis includes, but is not limited to,  Fracture (acute/chronic), muscle strain, cauda equina, spinal stenosis, DDD, ligamentous injury, disk herniation, metastatic cancer, vertebral osteomyelitis, kidney stone, pyelonephritis, AAA, pancreatitis, bowel obstruction, meningitis. This is not an exhaustive differential.   Past Medical History / Co-morbidities: IBS, rheumatoid arthritis  Additional history: Chart reviewed. Pertinent results include: Unable to view previous notes or images from [facility]  Physical Exam: Physical exam performed. The pertinent findings include: Patient appears clinically well.  No midline spinal tenderness, step-offs or crepitus.  Decreased sensation to medial right knee.   Disposition: After consideration of the diagnostic results and the patients response to treatment, I feel that emergency department workup does not suggest an emergent condition requiring admission or immediate intervention beyond what has been performed at this time. The plan is: discharge to home with medrol dose pack, over the counter medications and orthopedic referral. Symptoms likely related to lumbar strain vs radiculopathy. No myelopathic symptoms. Appears well and no history of IV drug use, low concern for epidural abscess. The patient is safe for discharge and has been instructed to return immediately for worsening symptoms, change in symptoms or any other concerns.   Final Clinical Impression(s) / ED Diagnoses Final diagnoses:  Acute right-sided low back pain with right-sided sciatica    Rx / DC Orders ED Discharge Orders          Ordered    AMB referral to orthopedics         01/21/22 1211    methylPREDNISolone (MEDROL DOSEPAK) 4 MG TBPK tablet        01/21/22 1211           Portions of this report may have been transcribed using voice recognition software. Every effort was made to ensure accuracy; however, inadvertent computerized transcription errors may be present.    Armonie Mettler T, PA-C 01/21/22 1447    Edwin Dada P, DO 01/24/22 1545

## 2022-01-21 NOTE — Discharge Instructions (Addendum)
You were seen in the emergency department for back pain.  As we discussed, I think your pain is likely coming from arthritis in your [place] or possible changes to your vertebral discs. I recommend continuing ibuprofen and/or tylenol as needed. I am also prescribing you a course of steroids that should help with the inflammation around your nerves.   I have sent a referral to the orthopedic doctor for you. I've attached their contact information for you to call and make an appointment if you do not hear from them by the end of the week.  Continue to monitor how you're doing and return to the ER for new or worsening symptoms.

## 2022-01-21 NOTE — ED Notes (Signed)
Pt d/c home per MD order. Discharge summary reviewed, pt verbalizes understanding. Ambulatory off unit. No s/s of acute distress noted at discharge.  °

## 2022-01-22 ENCOUNTER — Encounter: Payer: Self-pay | Admitting: Sports Medicine

## 2022-01-22 ENCOUNTER — Ambulatory Visit (INDEPENDENT_AMBULATORY_CARE_PROVIDER_SITE_OTHER): Payer: BC Managed Care – PPO

## 2022-01-22 ENCOUNTER — Ambulatory Visit: Payer: BC Managed Care – PPO | Admitting: Sports Medicine

## 2022-01-22 VITALS — BP 113/77 | HR 89 | Ht 64.0 in | Wt 147.0 lb

## 2022-01-22 DIAGNOSIS — R29898 Other symptoms and signs involving the musculoskeletal system: Secondary | ICD-10-CM

## 2022-01-22 DIAGNOSIS — M5410 Radiculopathy, site unspecified: Secondary | ICD-10-CM | POA: Diagnosis not present

## 2022-01-22 DIAGNOSIS — M5441 Lumbago with sciatica, right side: Secondary | ICD-10-CM

## 2022-01-22 NOTE — Progress Notes (Signed)
Office Visit Note   Patient: Miranda [name]           Date of Birth: 1961-03-15           MRN: [id number] Visit Date: 01/22/2022              Requested by: Aline August T, PA-C [address] [place],  [place] [address] PCP: Pearline Cables, MD   Assessment & Plan: Visit Diagnoses:  1. Acute right-sided low back pain with right-sided sciatica   2. Radicular pain of right lower extremity   3. Right leg weakness    Plan: Discussed Reese's low back pain which does have a radicular component down the right lower extremity.  There is some weakness of the hip and knee, question whether this is secondary to pain versus true neural impingement.  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  She will continue her prednisone Dosepak May take NSAIDs as needed for her acute pain.  We will get her started on McKenzie extension low back rehab exercises and therapy once her severe pain does subside.  She will give me a call in a week to 10 days if her pain is not improving or she is having continued symptoms of weakness and/or radicular pain.  May proceed with MRI of the low back at that time, however will hold off currently to assess for response.  Follow-Up Instructions: Follow-up in 2 weeks if not improving    Orders:  Orders Placed This Encounter  Procedures   XR Lumbar Spine 2-3 Views   XR HIP UNILAT W OR W/O PELVIS 2-3 VIEWS RIGHT   No orders of the defined types were placed in this encounter.   Subjective: Chief Complaint  Patient presents with   Lower Back - Pain    HPI Miranda [name] is a pleasant 61 year-old female who presents with acute exacerbation of right sided-low back pain. Initial pain was about 2 weeks ago. No known injury. Did notice it after she was doing a lot more bending over. Pain is achy but also sharp. Does report radiating pain from the right low back with associated numbness and tingling down the lateral thigh and anterior leg. She does feel like  her right leg is weaker around the hip/knee. No giving out of the leg. Has had issue of constipation before which is worsened but no bowel/bladder incontinence. Radiates into front of hip/groin and down leg. She was given a Prednisone dosepak after being seen in ED on 01/21/22, on day 2 of this - unsure if helping yet or not. Also taking NSAID/tylenol alternating.   Objective: Vital Signs: BP 113/77   Pulse 89   Ht 5\' 4"  (1.626 m)   Wt 147 lb (66.7 kg)   LMP 09/06/2012   BMI 25.23 kg/m   Physical Exam Gen: Well-appearing, in no acute distress; non-toxic CV: Regular Rate. Well-perfused. Warm.  Resp: Breathing unlabored on room air; no wheezing. Psych: Fluid speech in conversation; appropriate affect; normal thought process Neuro: Sensation intact throughout. No gross coordination deficits.   Ortho Exam  - Lumbar spine/RLE: No gross deformity scoliosis noted.  No overlying skin changes.  There is some tenderness to palpation over the midline of L4-L5, and more so over the right paraspinal musculature on the right.  No SI joint TTP.  There is pain with endrange active flexion, full range extension without pain.  Negative straight leg raise, positive painful modified slump's test on the right.  There is some weakness with hip flexion and knee extension, as both of these do reproduce some of her pain.  Equivocal strength with ankle dorsiflexion/plantarflexion and great toe extension.  2+ L4 and S1 reflexes bilaterally.  Specialty Comments:  No specialty comments available.  Imaging: XR HIP UNILAT W OR W/O PELVIS 2-3 VIEWS RIGHT  Result Date: 01/22/2022 2 view of the right hip were ordered and reviewed by myself, including AP and lateral films.  X-rays demonstrate a well-preserved hip joint without significant arthritis.  No acute fracture or other bony abnormality noted.   XR Lumbar Spine 2-3 Views  Result Date: 01/22/2022 2 views including AP and lateral films of the lumbar spine were  ordered and reviewed by myself.  X-rays demonstrate 5 lumbar vertebrae with mild loss of the normal lordotic curve.  There is trace anterolisthesis of L3 on L4.  There is quite significant intervertebral disc space narrowing between L4-L5.    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   ALLERGY 05/10/2007   Past Medical History:  Diagnosis Date   Abnormal Pap smear of cervix 1996   Allergy    Allergy, unspecified not elsewhere classified    IBS (irritable bowel syndrome)    Idiopathic hypersomnia    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    Family History  Adopted: Yes  Problem Relation Age of Onset   Cancer Mother        stomach   Heart disease Mother    Heart attack Mother    Heart disease Father    Colon cancer Neg Hx     Past Surgical History:  Procedure Laterality Date   CRYOTHERAPY     SHOULDER SURGERY     right   TONSILLECTOMY     Social History   Occupational History   Not on file  Tobacco Use   Smoking status: Never   Smokeless tobacco: Never  Vaping Use   Vaping Use: Never used  Substance and Sexual Activity   Alcohol use: No    Alcohol/week: 0.0 standard drinks of alcohol   Drug use: No   Sexual activity: Not Currently    Partners: Female    Birth control/protection: Post-menopausal

## 2022-01-22 NOTE — Patient Instructions (Signed)
Celisse - It was great to meet you today, thank you for letting me participate in your care! I'm sorry your back is bothering you.  On x-ray of your low back, we did note some arthritis and disc space narrowing between your L4 and L5 vertebra. You will continue your prednisone medication, continue alternating Aleve/Motrin and Tylenol. Once your acute pain is improving, perform the home back exercises once daily.  If your pain is still significant or is worsening after a week of taking the medication, please give our office a call and we can consider a longer medication dose and/or proceeding with MRI of the low back.  Otherwise, you will follow-up with me in 3 weeks unless 100% improved.  Dr. Madelyn Brunner, DO

## 2022-01-22 NOTE — Progress Notes (Signed)
Low back pain for 2.5 weeks. Pain has gotten worse since the beginning. Was seen in ER 01/21/22. No x-rays were taken, prednisone was prescribed. Pain,numbness and tingling primarily on right leg; starting to do it on the left. Complaining of right hip/groin pain.

## 2022-01-24 ENCOUNTER — Encounter: Payer: Self-pay | Admitting: Sports Medicine

## 2022-01-24 DIAGNOSIS — M5410 Radiculopathy, site unspecified: Secondary | ICD-10-CM

## 2022-01-24 DIAGNOSIS — M5441 Lumbago with sciatica, right side: Secondary | ICD-10-CM

## 2022-01-24 DIAGNOSIS — R29898 Other symptoms and signs involving the musculoskeletal system: Secondary | ICD-10-CM

## 2022-01-29 ENCOUNTER — Encounter: Payer: Self-pay | Admitting: Sports Medicine

## 2022-01-29 NOTE — Progress Notes (Addendum)
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 NPSG 12/19/01- AHI 0, no snoring, 24% REM, 88.7% sleep efficiency MSLT- 07/05/03- nonspecific hypersomnia, mean latency 3.5 minutes, No SOREM PFT 07/23/2018- minimal obstruction, overinflation, airtrapping, normal Diffusion, no response to BD -----------------------------------------------------------------------------------------------   01/08/21-  61 year old female never smoker followed for Narcolepsy/ Idiopathic Hypersomnia with insomnia complicated by seronegative RA, IBS, idiopathic hypersomnia -Armodafinil, adderall 10 1-2 daily, Adderall XR 20 1 daily,  Covid vax 4 Phizer Uses armodafinil occ but didn't like the way she felt taking it on top of adderall. Naps occ when she can. Denies other health changes.  01/30/22-  61 year old female never smoker followed for Narcolepsy/ Idiopathic Hypersomnia with insomnia complicated by Arthritis, IBS,  -Armodafinil 250, Adderall 10 1-2 daily, Adderall XR 30 1 daily,  Covid vax 4 Phizer -----She is doing well with her medications and needs refills.  She feels she gets enough sleep except that currently a pinched nerve is making her uncomfortable.  That work-up is ongoing. 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 NPSG/MSLT in 2005 documented pathologic sleepiness. Her daily pattern is to take 1 armodafinil and the Adderall XR30 in the morning and then 10 mg Adderall around midday as needed.  This works well for her and she does not desire change. She has arthritis but the diagnosis has been in flux.  She is not on significant immunomodulators for this. CXR 09/09/21- IMPRESSION: No acute findings.  ROS-see HPI   + = positive Constitutional:    weight loss, night sweats, fevers, chills, + fatigue, lassitude. HEENT:    headaches, difficulty swallowing, tooth/dental problems, sore throat,       sneezing, itching, ear  ache, nasal congestion, post nasal drip, snoring CV:    chest pain, orthopnea, PND, swelling in lower extremities, anasarca,                              dizziness, palpitations Resp:   shortness of breath with exertion or at rest.                productive cough,   non-productive cough, coughing up of blood.              change in color of mucus.  wheezing.   Skin:    rash or lesions. GI:  No-   heartburn, indigestion, abdominal pain, nausea, vomiting,e GU: . MS:   joint pain, stiffness, . Neuro-    + auditory hallucination Psych:  change in mood or affect.  depression or anxiety.   memory loss.  OBJ- Physical Exam General- Alert, Oriented, Affect-appropriate, Distress- none acute, looks well Skin- rash-none, lesions- none, excoriation- none Lymphadenopathy- none Head- atraumatic            Eyes- Gross vision intact, PERRLA, conjunctivae and secretions clear            Ears- Hearing, canals-normal            Nose- Clear, no-Septal dev, mucus, polyps, erosion, perforation             Throat- Mallampati II , mucosa clear , drainage- none, tonsils- atrophic Neck- flexible , trachea midline, no stridor , thyroid nl, carotid no bruit Chest - symmetrical excursion , unlabored           Heart/CV- RRR , no murmur , no gallop  , no rub, nl s1 s2                           -  JVD- none , edema- none, stasis changes- none, varices- none           Lung- clear to P&A, wheeze- none, cough- none , dullness-none, rub- none           Chest wall-  Abd-  Br/ Gen/ Rectal- Not done, not indicated Extrem- cyanosis- none, clubbing, none, atrophy- none, strength- nl Neuro- grossly intact to observation

## 2022-01-30 ENCOUNTER — Ambulatory Visit: Payer: BC Managed Care – PPO | Admitting: Internal Medicine

## 2022-01-30 ENCOUNTER — Other Ambulatory Visit: Payer: Self-pay | Admitting: Sports Medicine

## 2022-01-30 ENCOUNTER — Other Ambulatory Visit (HOSPITAL_COMMUNITY): Payer: Self-pay

## 2022-01-30 ENCOUNTER — Encounter: Payer: Self-pay | Admitting: Internal Medicine

## 2022-01-30 DIAGNOSIS — G47419 Narcolepsy without cataplexy: Secondary | ICD-10-CM

## 2022-01-30 DIAGNOSIS — M359 Systemic involvement of connective tissue, unspecified: Secondary | ICD-10-CM

## 2022-01-30 DIAGNOSIS — G4711 Idiopathic hypersomnia with long sleep time: Secondary | ICD-10-CM | POA: Diagnosis not present

## 2022-01-30 MED ORDER — AMPHETAMINE-DEXTROAMPHET ER 30 MG PO CP24: 30.0000 mg | ORAL_CAPSULE | Freq: Every day | ORAL | 0 refills | Status: DC

## 2022-01-30 MED ORDER — AMPHETAMINE-DEXTROAMPHETAMINE 10 MG PO TABS
ORAL_TABLET | ORAL | 0 refills | Status: DC
Start: 2022-01-30 — End: 2022-03-27

## 2022-01-30 MED ORDER — TRAMADOL HCL 50 MG PO TABS: 50.0000 mg | ORAL_TABLET | Freq: Four times a day (QID) | ORAL | 0 refills | Status: DC | PRN

## 2022-01-30 MED ORDER — ARMODAFINIL 250 MG PO TABS
ORAL_TABLET | ORAL | 5 refills | Status: DC
Start: 2022-01-30 — End: 2022-03-27

## 2022-01-30 NOTE — Assessment & Plan Note (Signed)
She is unsure what specific diagnostic terms is being used for her arthritis pattern currently.  She continues follow-up.

## 2022-01-30 NOTE — Assessment & Plan Note (Signed)
Pattern is stable.  Medications work well.  She tries to be careful with sleep hygiene. Plan-refill armodafinil and both Adderall strengths

## 2022-01-30 NOTE — Patient Instructions (Signed)
Meds refilled. Glad they help.  Hope your back gets better.  Please call if you need anything.

## 2022-02-01 ENCOUNTER — Ambulatory Visit
Admission: RE | Admit: 2022-02-01 | Discharge: 2022-02-01 | Disposition: A | Discharge status: Home / Self Care | Payer: BC Managed Care – PPO | Source: Ambulatory Visit | Attending: Sports Medicine | Admitting: Sports Medicine

## 2022-02-01 DIAGNOSIS — M5410 Radiculopathy, site unspecified: Secondary | ICD-10-CM

## 2022-02-01 DIAGNOSIS — M5441 Lumbago with sciatica, right side: Secondary | ICD-10-CM

## 2022-02-01 DIAGNOSIS — R29898 Other symptoms and signs involving the musculoskeletal system: Secondary | ICD-10-CM

## 2022-02-01 DIAGNOSIS — M79604 Pain in right leg: Secondary | ICD-10-CM | POA: Diagnosis not present

## 2022-02-01 DIAGNOSIS — M545 Low back pain, unspecified: Secondary | ICD-10-CM | POA: Diagnosis not present

## 2022-02-01 DIAGNOSIS — M5416 Radiculopathy, lumbar region: Secondary | ICD-10-CM | POA: Diagnosis not present

## 2022-02-03 ENCOUNTER — Other Ambulatory Visit: Payer: Self-pay | Admitting: Sports Medicine

## 2022-02-03 DIAGNOSIS — R29898 Other symptoms and signs involving the musculoskeletal system: Secondary | ICD-10-CM

## 2022-02-03 DIAGNOSIS — M5441 Lumbago with sciatica, right side: Secondary | ICD-10-CM

## 2022-02-03 DIAGNOSIS — M5410 Radiculopathy, site unspecified: Secondary | ICD-10-CM

## 2022-02-04 ENCOUNTER — Encounter: Payer: Self-pay | Admitting: Sports Medicine

## 2022-02-06 ENCOUNTER — Telehealth: Payer: Self-pay | Admitting: Physical Medicine and Rehabilitation

## 2022-02-06 NOTE — Telephone Encounter (Signed)
IC patient was advising unable to do injection without auth. Waiting on auth for completion in order to schedule.

## 2022-02-06 NOTE — Telephone Encounter (Signed)
Patient states she would like to schedule the spinal injection even without the pre auth if possible. CB # [id number].

## 2022-02-06 NOTE — Telephone Encounter (Signed)
Patient returned call  asked for a call back/ The number to contact patient is [phone]

## 2022-02-06 NOTE — Telephone Encounter (Signed)
Call was disconnected

## 2022-02-07 ENCOUNTER — Encounter: Payer: Self-pay | Admitting: Sports Medicine

## 2022-02-07 ENCOUNTER — Telehealth: Payer: Self-pay | Admitting: Physical Medicine and Rehabilitation

## 2022-02-07 NOTE — Telephone Encounter (Signed)
See other note

## 2022-02-07 NOTE — Telephone Encounter (Signed)
Patient called asked for a call back as soon as possible. Patient said she checked with her insurance and the request for approval for the injection is not showing in the system yet. Patient said she is in a lot of pain.The number to contact patient is [phone]

## 2022-02-07 NOTE — Telephone Encounter (Signed)
Auth received and appt scheduled. Patient notified.

## 2022-02-07 NOTE — Telephone Encounter (Signed)
I have called and discussed. Advised per note in referral Terri placed yesterday. Asking about paying OOP without insurance auth. Advised will discuss with DrNewton and call them back.

## 2022-02-10 ENCOUNTER — Encounter: Payer: Self-pay | Admitting: Family Medicine

## 2022-02-10 MED ORDER — DOXYCYCLINE HYCLATE 100 MG PO TABS: 100.0000 mg | ORAL_TABLET | Freq: Two times a day (BID) | ORAL | 0 refills | Status: DC

## 2022-02-10 NOTE — Telephone Encounter (Signed)
Okay for VV wed at 10am?

## 2022-02-10 NOTE — Telephone Encounter (Signed)
Called pt back- she had covid not quite a month ago She notes she developed back pain after she had the covid- she had an MRI done as below  She is supposed to get an epidural steroid injection tomorrow and is a bit concerned about some symptoms she has noted IMPRESSION: 1. Small right foraminal disc extrusion at L3-4 with probable right L3 and possible right L4 nerve root encroachment. 2. No other significant spinal stenosis or definite nerve root encroachment. 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 3. Mild left foraminal narrowing at L5-S1.  She seemed to be doing fine as far as recovery from [place], but then about a week ago started to notice nonspecific malaise She notes low grade temps of about 100 over the last week No cough She notes she does not feel great but has no other specific symptoms No rashes that sound like tickborne illness  Discussed with her.  I asked her to please call radiology and let them know about these low-grade fevers, make sure they want to proceed with her epidural steroid injection Otherwise asked her to check her temperature and record 3 times daily.  We will have her start on doxycycline for possibility of tickborne illness.  Made her an appointment to see me on Wednesday-she will seek care if getting worse

## 2022-02-11 ENCOUNTER — Ambulatory Visit: Payer: BC Managed Care – PPO | Admitting: Physical Medicine and Rehabilitation

## 2022-02-11 ENCOUNTER — Ambulatory Visit: Payer: Self-pay

## 2022-02-11 ENCOUNTER — Encounter: Payer: Self-pay | Admitting: Physical Medicine and Rehabilitation

## 2022-02-11 VITALS — BP 120/76 | HR 116

## 2022-02-11 DIAGNOSIS — M5116 Intervertebral disc disorders with radiculopathy, lumbar region: Secondary | ICD-10-CM

## 2022-02-11 DIAGNOSIS — M5416 Radiculopathy, lumbar region: Secondary | ICD-10-CM | POA: Diagnosis not present

## 2022-02-11 MED ORDER — DEXAMETHASONE SODIUM PHOSPHATE 10 MG/ML IJ SOLN
10.0000 mg | Freq: Once | INTRAMUSCULAR | Status: AC
  Administered 2022-02-11: 10 mg

## 2022-02-11 NOTE — Patient Instructions (Signed)

## 2022-02-11 NOTE — Progress Notes (Signed)
Right sided lbp with right leg . States it is a burning and constant pain with some numbness. Taking tramadol and a muscle relaxer. These only help her sleep   Numeric Pain Rating Scale and Functional Assessment Average Pain 7   In the last MONTH (on 0-10 scale) has pain interfered with the following?  1. General activity like being  able to carry out your everyday physical activities such as walking, climbing stairs, carrying groceries, or moving a chair?  Rating(10)   +Driver, -BT, -Dye Allergies.

## 2022-02-12 ENCOUNTER — Encounter: Payer: Self-pay | Admitting: Family Medicine

## 2022-02-12 ENCOUNTER — Ambulatory Visit: Payer: BC Managed Care – PPO | Admitting: Sports Medicine

## 2022-02-12 ENCOUNTER — Ambulatory Visit: Payer: BC Managed Care – PPO | Admitting: Family Medicine

## 2022-02-12 VITALS — BP 110/64 | HR 114 | Temp 98.2°F | Resp 18 | Ht 64.0 in | Wt 143.6 lb

## 2022-02-12 DIAGNOSIS — R000 Tachycardia, unspecified: Secondary | ICD-10-CM | POA: Diagnosis not present

## 2022-02-12 DIAGNOSIS — K5900 Constipation, unspecified: Secondary | ICD-10-CM

## 2022-02-12 DIAGNOSIS — R509 Fever, unspecified: Secondary | ICD-10-CM

## 2022-02-12 LAB — COMPREHENSIVE METABOLIC PANEL
ALT: 46 U/L — ABNORMAL HIGH (ref 0–35)
AST: 28 U/L (ref 0–37)
Albumin: 3.6 g/dL (ref 3.5–5.2)
Alkaline Phosphatase: 121 U/L — ABNORMAL HIGH (ref 39–117)
BUN: 21 mg/dL (ref 6–23)
CO2: 30 mEq/L (ref 19–32)
Calcium: 10.1 mg/dL (ref 8.4–10.5)
Chloride: 99 mEq/L (ref 96–112)
Creatinine, Ser: 0.86 mg/dL (ref 0.40–1.20)
GFR: 72.79 mL/min (ref >60.00)
Glucose, Bld: 90 mg/dL (ref 70–99)
Potassium: 3.6 mEq/L (ref 3.5–5.1)
Sodium: 138 mEq/L (ref 135–145)
Total Bilirubin: 0.6 mg/dL (ref 0.2–1.2)
Total Protein: 6.8 g/dL (ref 6.0–8.3)

## 2022-02-12 LAB — TSH: TSH: 1.19 u[IU]/mL (ref 0.35–5.50)

## 2022-02-12 LAB — CBC
HCT: 38.8 % (ref 36.0–46.0)
Hemoglobin: 13 g/dL (ref 12.0–15.0)
MCHC: 33.4 g/dL (ref 30.0–36.0)
MCV: 93.3 fl (ref 78.0–100.0)
Platelets: 293 10*3/uL (ref 150.0–400.0)
RBC: 4.15 Mil/uL (ref 3.87–5.11)
RDW: 12.3 % (ref 11.5–15.5)
WBC: 13.9 10*3/uL — ABNORMAL HIGH (ref 4.0–10.5)

## 2022-02-12 LAB — SEDIMENTATION RATE: Sed Rate: 43 mm/hr — ABNORMAL HIGH (ref 0–30)

## 2022-02-12 NOTE — Patient Instructions (Signed)
It was good to see you today- I will be in touch with your labs and your x-rays asap Please stop by [facility] on [address] at your convenience to get an x-ray of your chest and abdomen Please continue to monitor your temperatures at home

## 2022-02-12 NOTE — Progress Notes (Addendum)
[place] [facility] [address] [place], [place] [address] [id number] [phone]  Date:  02/12/2022   Name:  Miranda [name]   DOB:  1960/07/09   MRN:  [id number]  PCP:  Darreld Mclean, MD    Chief Complaint: Fever (1. Pt had covid about 1 month ago and has concerns for long covid. 2. Constipation since mid July. )   History of Present Illness:  Miranda [name] is a 61 y.o. very pleasant female patient who presents with the following:  History of seronegative arthritis, connective tissue disease, difficulty with sleep and hypersomnia, mood disorder, cervical spine disease, palpitations  She tested + for covid and started sx of covid on 8/22- treated with paxlovid  About a week late she was in the ER with her back- the next day she saw Dr Rolena Infante  She got an injection yesterday for lumbar radiculopathy- she hopes this will help!  She notes at least some improvement in her back already  Over the last week or so she has noted a few issues-she was not sure potentially related to recent COVID-19 illness Neck muscle spasm for a day or so which then resolved Swelling of the lateral right elbow tendon- resolved after a day or so Then she had pain under her left axilla- it was very tender to the touch.  Better but not gone   See phone note 9/18 I called her in some doxycycline as she was concerned about low grade fevers-prophylactically cover for tickborne illness Temp up to 99.0 yesterday The highest temp she notes was 100.6 this past Sunday- today is Wednesday  She has also noted constipation since July- she has to use a laxative in order to go  No fever reducer for the last 3 days   Her mammo is UTD  No urinary sx noted   She has seen rheumatology in the past at [facility] for her mixed connective tissue disorder; however, at that time her labs were not very helpful, patient reports she tested negative for lupus although  rheumatology suspected this could be her issue  She did take adderall this am which is likely why her pulse is up a bit    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   ALLERGY 05/10/2007    Past Medical History:  Diagnosis Date   Abnormal Pap smear of cervix 1996   Allergy    Allergy, unspecified not elsewhere classified    IBS (irritable bowel syndrome)    Idiopathic hypersomnia    RA (rheumatoid arthritis) ([place])    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    Past Surgical History:  Procedure Laterality Date   CRYOTHERAPY     SHOULDER SURGERY     right   TONSILLECTOMY      Social History   Tobacco Use   Smoking status: Never   Smokeless tobacco: Never  Vaping Use   Vaping Use: Never used  Substance Use Topics   Alcohol use: No    Alcohol/week: 0.0 standard drinks of alcohol   Drug use: No    Family  History  Adopted: Yes  Problem Relation Age of Onset   Cancer Mother        stomach   Heart disease Mother    Heart attack Mother    Heart disease Father    Colon cancer Neg Hx     Allergies  Allergen Reactions   Keflex [Cephalexin] Itching    Medication list has been reviewed and updated.  Current Outpatient Medications on File Prior to Visit  Medication Sig Dispense Refill   amphetamine-dextroamphetamine (ADDERALL XR) 30 MG 24 hr capsule Take 1 capsule (30 mg total) by mouth daily. 31 capsule 0   amphetamine-dextroamphetamine (ADDERALL) 10 MG tablet 1 or 2 daily if needed 60 tablet 0   Armodafinil 250 MG tablet TAKE 1 TABLET EVERY MORNING AND ONE-HALF  TABLET AT NOON 45 tablet 5   cyclobenzaprine (FLEXERIL) 10 MG tablet Take 1 tablet (10 mg total) by mouth 2 (two) times daily as needed for muscle spasms. 30 tablet 0   doxycycline (VIBRA-TABS) 100 MG tablet Take 1 tablet (100 mg total) by mouth 2 (two) times daily. 20 tablet 0   escitalopram (LEXAPRO) 20 MG tablet TAKE 1 TABLET(20 MG) BY MOUTH DAILY 90 tablet 3   estradiol (VIVELLE-DOT) 0.05 MG/24HR patch Place 1 patch (0.05 mg total) onto the skin 2 (two) times a week. 24 patch 4   Ibuprofen (ADVIL PO) Take 200 mg by mouth as needed (pain).      progesterone (PROMETRIUM) 100 MG capsule Take 1 capsule (100 mg total) by mouth at bedtime. 90 capsule 4   traMADol (ULTRAM) 50 MG tablet Take 1 tablet (50 mg total) by mouth every 6 (six) hours as needed for severe pain. 15 tablet 0   Current Facility-Administered Medications on File Prior to Visit  Medication Dose Route Frequency Provider Last Rate Last Admin   dexamethasone (DECADRON) injection 10 mg  10 mg Other Once Magnus Sinning, MD        Review of Systems:  As per HPI- otherwise negative. Pulse Readings from Last 3 Encounters:  02/12/22 (!) 114  02/11/22 (!) 116  01/30/22 95     Physical Examination: Vitals:   02/12/22 1002  BP: 110/64  Pulse: (!) 114  Resp: 18  Temp: 98.2 F (36.8 C)  SpO2: 98%   Vitals:   02/12/22 1002  Weight: 143 lb 9.6 oz (65.1 kg)  Height: 5\' 4"  (1.626 m)   Body mass index is 24.65 kg/m. Ideal Body Weight: Weight in (lb) to have BMI = 25: 145.3  GEN: no acute distress.  Normal weight, looks well HEENT: Atraumatic, Normocephalic.  Bilateral TM wnl, oropharynx normal.  PEERL,EOMI.   Ears and Nose: No external deformity. CV: RRR, No M/G/R. No JVD. No thrill. No extra heart sounds. PULM: CTA B, no wheezes, crackles, rhonchi. No retractions. No resp. distress. No accessory muscle use. ABD: S, NT, ND, +BS. No rebound. No HSM. EXTR: No c/c/e PSYCH: Normally interactive. Conversant.  No unusual  rashes are noted.  The right elbow swelling and neck spasm have resolved.  Left axilla and breast are normal  Assessment and Plan: Low grade fever - Plan: CBC, Comprehensive metabolic panel, Sedimentation rate, Urine Culture, DG Chest 2 View, QuantiFERON-TB Gold Plus, HIV Antibody (routine testing w rflx), Blood culture (routine single), CANCELED: Blood culture (routine single), CANCELED: QuantiFERON-TB Gold Plus, CANCELED: HIV Antibody (routine testing w rflx)  Tachycardia - Plan: TSH  Constipation, unspecified constipation type - Plan: DG Abd 2 Views  Patient seen today  with concern of low-grade fever as well as some transient body pains as described above She did recently have COVID-19, there is also a question of autoimmune disease/mixed connective tissue disorder We will start evaluation with lab work as above, including blood culture, urine culture, also obtain a chest x-ray Mild tachycardia likely due to stimulant use Patient is also noted some constipation, will obtain abdominal films For the time being continue doxycycline, I have asked her to continue to measure and document her temperature 2-3 times a day She has some discomfort in the left axilla, this is really resolved and exam today seems normal.  Her mammogram is up-to-date.  I reminded patient of the importance of evaluating this further if it should recur, as the axilla is considered to be part of the breast and mass here could indicate breast pathology  Signed Lamar Blinks, MD  Results for orders placed or performed in visit on 02/12/22  Urine Culture   Specimen: Urine  Result Value Ref Range   MICRO NUMBER: [id number]    SPECIMEN QUALITY: Adequate    Sample Source NOT GIVEN    STATUS: FINAL    Result: No Growth   Blood culture (routine single)   Specimen: Blood   BLD  Result Value Ref Range   BLOOD CULTURE, ROUTINE Preliminary report    Organism ID, Bacteria Comment   CBC  Result Value Ref Range   WBC 13.9  (H) 4.0 - 10.5 K/uL   RBC 4.15 3.87 - 5.11 Mil/uL   Platelets 293.0 150.0 - 400.0 K/uL   Hemoglobin 13.0 12.0 - 15.0 g/dL   HCT 38.8 36.0 - 46.0 %   MCV 93.3 78.0 - 100.0 fl   MCHC 33.4 30.0 - 36.0 g/dL   RDW 12.3 11.5 - 15.5 %  Comprehensive metabolic panel  Result Value Ref Range   Sodium 138 135 - 145 mEq/L   Potassium 3.6 3.5 - 5.1 mEq/L   Chloride 99 96 - 112 mEq/L   CO2 30 19 - 32 mEq/L   Glucose, Bld 90 70 - 99 mg/dL   BUN 21 6 - 23 mg/dL   Creatinine, Ser 0.86 0.40 - 1.20 mg/dL   Total Bilirubin 0.6 0.2 - 1.2 mg/dL   Alkaline Phosphatase 121 (H) 39 - 117 U/L   AST 28 0 - 37 U/L   ALT 46 (H) 0 - 35 U/L   Total Protein 6.8 6.0 - 8.3 g/dL   Albumin 3.6 3.5 - 5.2 g/dL   GFR 72.79 >60.00 mL/min   Calcium 10.1 8.4 - 10.5 mg/dL  Sedimentation rate  Result Value Ref Range   Sed Rate 43 (H) 0 - 30 mm/hr  TSH  Result Value Ref Range   TSH 1.19 0.35 - 5.50 uIU/mL  HIV Antibody (routine testing w rflx)  Result Value Ref Range   HIV 1&2 Ab, 4th Generation NON-REACTIVE NON-REACTIVE    Addnd 9/22- received her urine culture, as well as updated labs and chest x-ray Message to pt    DG Chest 2 View  Result Date: 02/13/2022 CLINICAL DATA:  Low-grade fevers. EXAM: CHEST - 2 VIEW COMPARISON:  September 09, 2021 FINDINGS: Cardiomediastinal silhouette is normal. Mediastinal contours appear intact. There is no evidence of focal airspace consolidation, pleural effusion or pneumothorax. Osseous structures are without acute abnormality. Soft tissues are grossly normal. IMPRESSION: No active cardiopulmonary disease. Electronically Signed   By: Fidela Salisbury M.D.   On: 02/13/2022 09:46

## 2022-02-13 ENCOUNTER — Ambulatory Visit
Admission: RE | Admit: 2022-02-13 | Discharge: 2022-02-13 | Disposition: A | Discharge status: Home / Self Care | Payer: BC Managed Care – PPO | Source: Ambulatory Visit | Attending: Family Medicine | Admitting: Family Medicine

## 2022-02-13 ENCOUNTER — Encounter: Payer: Self-pay | Admitting: Family Medicine

## 2022-02-13 DIAGNOSIS — K5900 Constipation, unspecified: Secondary | ICD-10-CM | POA: Diagnosis not present

## 2022-02-13 DIAGNOSIS — R509 Fever, unspecified: Secondary | ICD-10-CM

## 2022-02-13 DIAGNOSIS — R1031 Right lower quadrant pain: Secondary | ICD-10-CM | POA: Diagnosis not present

## 2022-02-13 LAB — URINE CULTURE
MICRO NUMBER:: 13943818
Result:: NO GROWTH
SPECIMEN QUALITY:: ADEQUATE

## 2022-02-13 LAB — HIV ANTIBODY (ROUTINE TESTING W REFLEX): HIV 1&2 Ab, 4th Generation: NONREACTIVE

## 2022-02-14 ENCOUNTER — Encounter: Payer: Self-pay | Admitting: Family Medicine

## 2022-02-14 DIAGNOSIS — N644 Mastodynia: Secondary | ICD-10-CM

## 2022-02-16 LAB — QUANTIFERON-TB GOLD PLUS
Mitogen-NIL: 1.51 IU/mL
NIL: 0.04 IU/mL
QuantiFERON-TB Gold Plus: NEGATIVE
TB1-NIL: 0.01 IU/mL
TB2-NIL: 0.02 IU/mL

## 2022-02-17 ENCOUNTER — Ambulatory Visit: Payer: BC Managed Care – PPO | Admitting: Sports Medicine

## 2022-02-17 ENCOUNTER — Encounter: Payer: Self-pay | Admitting: Family Medicine

## 2022-02-18 LAB — CULTURE, BLOOD (SINGLE)

## 2022-02-19 NOTE — Procedures (Signed)
Lumbosacral Transforaminal Epidural Steroid Injection - Sub-Pedicular Approach with Fluoroscopic Guidance  Patient: Miranda [name]      Date of Birth: Apr 09, 1961 MRN: [id number] PCP: Darreld Mclean, MD      Visit Date: 02/11/2022   Universal Protocol:    Date/Time: 02/11/2022  Consent Given By: the patient  Position: PRONE  Additional Comments: Vital signs were monitored before and after the procedure. Patient was prepped and draped in the usual sterile fashion. The correct patient, procedure, and site was verified.   Injection Procedure Details:   Procedure diagnoses: Lumbar radiculopathy [M54.16]    Meds Administered:  Meds ordered this encounter  Medications   dexamethasone (DECADRON) injection 10 mg    Laterality: Right  Location/Site: L3  Needle:5.0 in., 22 ga.  Short bevel or Quincke spinal needle  Needle Placement: Transforaminal  Findings:    -Comments: Excellent flow of contrast along the nerve, nerve root and into the epidural space.  Procedure Details: After squaring off the end-plates to get a true AP view, the C-arm was positioned so that an oblique view of the foramen as noted above was visualized. The target area is just inferior to the "nose of the scotty dog" or sub pedicular. The soft tissues overlying this structure were infiltrated with 2-3 ml. of 1% Lidocaine without Epinephrine.  The spinal needle was inserted toward the target using a "trajectory" view along the fluoroscope beam.  Under AP and lateral visualization, the needle was advanced so it did not puncture dura and was located close the 6 O'Clock position of the pedical in AP tracterory. Biplanar projections were used to confirm position. Aspiration was confirmed to be negative for CSF and/or blood. A 1-2 ml. volume of Isovue-250 was injected and flow of contrast was noted at each level. Radiographs were obtained for documentation purposes.   After attaining the desired flow of  contrast documented above, a 0.5 to 1.0 ml test dose of 0.25% Marcaine was injected into each respective transforaminal space.  The patient was observed for 90 seconds post injection.  After no sensory deficits were reported, and normal lower extremity motor function was noted,   the above injectate was administered so that equal amounts of the injectate were placed at each foramen (level) into the transforaminal epidural space.   Additional Comments:  The patient tolerated the procedure well Dressing: 2 x 2 sterile gauze and Band-Aid    Post-procedure details: Patient was observed during the procedure. Post-procedure instructions were reviewed.  Patient left the clinic in stable condition.

## 2022-02-19 NOTE — Progress Notes (Signed)
Miranda [name] - 61 y.o. female MRN [id number]  Date of birth: 11-02-1960  Office Visit Note: Visit Date: 02/11/2022 PCP: Pearline Cables, MD Referred by: Pearline Cables, MD  Subjective: Chief Complaint  Patient presents with   Lower Back - Pain   HPI:  Miranda [name] is a 61 y.o. female who comes in today at the request of Dr. Madelyn Brunner for planned Right L3-4 Lumbar Transforaminal epidural steroid injection with fluoroscopic guidance.  The patient has failed conservative care including home exercise, medications, time and activity modification.  This injection will be diagnostic and hopefully therapeutic.  Please see requesting physician notes for further details and justification. MRI reviewed with images and spine model.  MRI reviewed in the note below.    ROS Otherwise per HPI.  Assessment & Plan: Visit Diagnoses:    ICD-10-CM   1. Lumbar radiculopathy  M54.16 XR C-ARM NO REPORT    Epidural Steroid injection    dexamethasone (DECADRON) injection 10 mg    2. Radiculopathy due to lumbar intervertebral disc disorder  M51.16 XR C-ARM NO REPORT    Epidural Steroid injection    dexamethasone (DECADRON) injection 10 mg      Plan: No additional findings.   Meds & Orders:  Meds ordered this encounter  Medications   dexamethasone (DECADRON) injection 10 mg    Orders Placed This Encounter  Procedures   XR C-ARM NO REPORT   Epidural Steroid injection    Follow-up: Return if symptoms worsen or fail to improve.   Procedures: No procedures performed  Lumbosacral Transforaminal Epidural Steroid Injection - Sub-Pedicular Approach with Fluoroscopic Guidance  Patient: Miranda [name]      Date of Birth: [birth year] MRN: [id number] PCP: Pearline Cables, MD      Visit Date: 02/11/2022   Universal Protocol:    Date/Time: 02/11/2022  Consent Given By: the patient  Position: PRONE  Additional Comments: Vital signs were monitored before and after  the procedure. Patient was prepped and draped in the usual sterile fashion. The correct patient, procedure, and site was verified.   Injection Procedure Details:   Procedure diagnoses: Lumbar radiculopathy [M54.16]    Meds Administered:  Meds ordered this encounter  Medications   dexamethasone (DECADRON) injection 10 mg    Laterality: Right  Location/Site: L3  Needle:5.0 in., 22 ga.  Short bevel or Quincke spinal needle  Needle Placement: Transforaminal  Findings:    -Comments: Excellent flow of contrast along the nerve, nerve root and into the epidural space.  Procedure Details: After squaring off the end-plates to get a true AP view, the C-arm was positioned so that an oblique view of the foramen as noted above was visualized. The target area is just inferior to the "nose of the scotty dog" or sub pedicular. The soft tissues overlying this structure were infiltrated with 2-3 ml. of 1% Lidocaine without Epinephrine.  The spinal needle was inserted toward the target using a "trajectory" view along the fluoroscope beam.  Under AP and lateral visualization, the needle was advanced so it did not puncture dura and was located close the 6 O'Clock position of the pedical in AP tracterory. Biplanar projections were used to confirm position. Aspiration was confirmed to be negative for CSF and/or blood. A 1-2 ml. volume of Isovue-250 was injected and flow of contrast was noted at each level. Radiographs were obtained for documentation purposes.   After attaining the desired flow of contrast documented above, a 0.5 to 1.0  ml test dose of 0.25% Marcaine was injected into each respective transforaminal space.  The patient was observed for 90 seconds post injection.  After no sensory deficits were reported, and normal lower extremity motor function was noted,   the above injectate was administered so that equal amounts of the injectate were placed at each foramen (level) into the transforaminal  epidural space.   Additional Comments:  The patient tolerated the procedure well Dressing: 2 x 2 sterile gauze and Band-Aid    Post-procedure details: Patient was observed during the procedure. Post-procedure instructions were reviewed.  Patient left the clinic in stable condition.    Clinical History: MRI LUMBAR SPINE WITHOUT CONTRAST   TECHNIQUE: Multiplanar, multisequence MR imaging of the lumbar spine was performed. No intravenous contrast was administered.   COMPARISON:  Lumbar spine radiographs 01/22/2022.   FINDINGS: Segmentation: Conventional anatomy assumed, with the last open disc space designated L5-S1.   Alignment: Mild convex left scoliosis centered at L3. Normal lateral alignment.   Vertebrae: No worrisome osseous lesion, acute fracture or pars defect. Scattered small hemangiomas, largest at L4. Mild endplate degenerative changes which are greatest at L4-5 and L5-S1. The visualized sacroiliac joints appear unremarkable.   Conus medullaris: Extends to the L1-2 level and appears normal.   Paraspinal and other soft tissues: No significant paraspinal findings.   Disc levels:   No significant disc space findings from T11-12 through L1-2.   L2-3: Mild disc bulging and facet hypertrophy. No spinal stenosis or nerve root encroachment.   L3-4: Loss of disc height with annular disc bulging and a small medial foraminal disc extrusion on the right with cephalad extension behind the L3 vertebral body. There is mass effect on the right L3 nerve root. There is also mild right lateral recess narrowing with possible right L4 nerve root encroachment. Mild bilateral facet hypertrophy. The spinal canal and left foramen are patent.   L4-5: Chronic degenerative disc disease with loss of disc height, annular disc bulging and endplate osteophytes. Mild facet hypertrophy. Mild resulting left lateral recess and biforaminal narrowing without definite nerve root  encroachment.   L5-S1: Mild disc bulging eccentric to the left with mild bilateral facet hypertrophy. Mild left foraminal narrowing without definite nerve root encroachment. The spinal canal and lateral recesses are widely patent.   IMPRESSION: 1. Small right foraminal disc extrusion at L3-4 with probable right L3 and possible right L4 nerve root encroachment. 2. No other significant spinal stenosis or definite nerve root encroachment. 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 3. Mild left foraminal narrowing at L5-S1.     Electronically Signed   By: Carey Bullocks M.D.   On: 02/03/2022 10:40     Objective:  VS:  HT:    WT:   BMI:     BP:120/76  HR:(!) 116bpm  TEMP: ( )  RESP:  Physical Exam Vitals and nursing note reviewed.  Constitutional:      General: She is not in acute distress.    Appearance: Normal appearance. She is not ill-appearing.  HENT:     Head: Normocephalic and atraumatic.     Right Ear: External ear normal.     Left Ear: External ear normal.  Eyes:     Extraocular Movements: Extraocular movements intact.  Cardiovascular:     Rate and Rhythm: Normal rate.     Pulses: Normal pulses.  Pulmonary:     Effort: Pulmonary effort is normal. No respiratory distress.  Abdominal:     General: There is no distension.     Palpations: Abdomen is soft.  Musculoskeletal:        General: Tenderness present.     Cervical back: Neck supple.     Right lower leg: No edema.     Left lower leg: No edema.     Comments: Patient has good distal strength with no pain over the greater trochanters.  No clonus or focal weakness.  Skin:    Findings: No erythema, lesion or rash.  Neurological:     General: No focal deficit present.     Mental Status: She is alert and oriented to person, place, and time.     Sensory: No sensory deficit.     Motor: No weakness or abnormal muscle tone.     Coordination:  Coordination normal.  Psychiatric:        Mood and Affect: Mood normal.        Behavior: Behavior normal.      Imaging: No results found.

## 2022-02-21 ENCOUNTER — Other Ambulatory Visit (HOSPITAL_COMMUNITY): Payer: Self-pay

## 2022-02-21 ENCOUNTER — Encounter: Payer: Self-pay | Admitting: Sports Medicine

## 2022-02-21 ENCOUNTER — Telehealth: Payer: Self-pay

## 2022-02-21 ENCOUNTER — Ambulatory Visit: Payer: BC Managed Care – PPO | Admitting: Sports Medicine

## 2022-02-21 DIAGNOSIS — M5441 Lumbago with sciatica, right side: Secondary | ICD-10-CM

## 2022-02-21 DIAGNOSIS — M5416 Radiculopathy, lumbar region: Secondary | ICD-10-CM | POA: Diagnosis not present

## 2022-02-21 NOTE — Progress Notes (Signed)
Miranda [name] - 61 y.o. female MRN [id number]  Date of birth: 1960-06-02  Office Visit Note: Visit Date: 02/21/2022 PCP: Pearline Cables, MD Referred by: Pearline Cables, MD  Subjective: Chief Complaint  Patient presents with   Lower Back - Pain   HPI: Miranda [name] is a pleasant 61 y.o. female who presents today for follow-up of right-sided low back pain with radiculopathy.  Her previous lumbar MRI did show foraminal disc herniation at the L3-L4 level which did impinge upon the right L3 and possible right L4 nerve root.  Given the degree of her radicular symptoms, we did send her to Dr. Alvester Morin who performed a right transforaminal L3 ESI about a week ago -patient reports this gave her about 50% relief both in pain and reduction in the radicular symptoms going down the anterolateral lateral thigh.  She does still feel like the right leg is slightly weaker about the hip than the left side.  Still continues with some numbness that radiates from the lateral hip into the anterior thigh just above the knee.   She did previously complete a prednisone Dosepak and has taken NSAIDs without much relief.  She has done the home rehab low back exercises but pelvic tilts caused her pain.  Is seeing some improvement, however still dealing with pain.  Pertinent ROS were reviewed with the patient and found to be negative unless otherwise specified above in HPI.   Assessment & Plan: Visit Diagnoses:  1. Lumbar radiculopathy   2. Acute right-sided low back pain with right-sided sciatica    Plan: I discussed with Miranda [name] the nature of her back pain and radicular symptoms, which she does have a disc herniation at the L3-L4 level with transforaminal nerve impingement on the right which fit her radicular symptoms.  She got about 50% relief from the [facility] from Dr. Alvester Morin, however still has pain and some radicular symptoms that fit the L3 dermatome/myotome.  Strength testing and DTRs were  intact, other than some weakness with hip flexion which may or may not be pain related weakness.  Given her ongoing symptoms I think it would be wise for her to see Dr. Christell Constant for him to evaluate the spine and see if she would be a candidate for microdiscectomy or additional treatment modalities.  In the meantime, I do think it is prudent to get her established in formalized physical therapy for the low back, this referral was sent today.  She may be able to treat this conservatively, however I would like surgical opinion whether microdiscectomy would be warranted for her.   Follow-up: Return for Follow-up with Dr. Christell Constant or Dr. Ophelia Charter for low back pain with RLE radiculopathy .     Meds & Orders: No orders of the defined types were placed in this encounter.   Orders Placed This Encounter  Procedures   Ambulatory referral to Physical Therapy     Procedures: No procedures performed      Clinical History: MRI LUMBAR SPINE WITHOUT CONTRAST   TECHNIQUE: Multiplanar, multisequence MR imaging of the lumbar spine was performed. No intravenous contrast was administered.   COMPARISON:  Lumbar spine radiographs 01/22/2022.   FINDINGS: Segmentation: Conventional anatomy assumed, with the last open disc space designated L5-S1.   Alignment: Mild convex left scoliosis centered at L3. Normal lateral alignment.   Vertebrae: No worrisome osseous lesion, acute fracture or pars defect. Scattered small hemangiomas, largest at L4. Mild endplate degenerative changes which are greatest at  L4-5 and L5-S1. The visualized sacroiliac joints appear unremarkable.   Conus medullaris: Extends to the L1-2 level and appears normal.   Paraspinal and other soft tissues: No significant paraspinal findings.   Disc levels:   No significant disc space findings from T11-12 through L1-2.   L2-3: Mild disc bulging and facet hypertrophy. No spinal stenosis or nerve root encroachment.   L3-4: Loss of disc height  with annular disc bulging and a small medial foraminal disc extrusion on the right with cephalad extension behind the L3 vertebral body. There is mass effect on the right L3 nerve root. There is also mild right lateral recess narrowing with possible right L4 nerve root encroachment. Mild bilateral facet hypertrophy. The spinal canal and left foramen are patent.   L4-5: Chronic degenerative disc disease with loss of disc height, annular disc bulging and endplate osteophytes. Mild facet hypertrophy. Mild resulting left lateral recess and biforaminal narrowing without definite nerve root encroachment.   L5-S1: Mild disc bulging eccentric to the left with mild bilateral facet hypertrophy. Mild left foraminal narrowing without definite nerve root encroachment. The spinal canal and lateral recesses are widely patent.   IMPRESSION: 1. Small right foraminal disc extrusion at L3-4 with probable right L3 and possible right L4 nerve root encroachment. 2. No other significant spinal stenosis or definite nerve root encroachment. 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 3. Mild left foraminal narrowing at L5-S1.     Electronically Signed   By: Richardean Sale M.D.   On: 02/03/2022 10:40  She reports that she has never smoked. She has never used smokeless tobacco.  Recent Labs    09/16/21 1200  HGBA1C 5.9    Objective:   Vital Signs: LMP 09/06/2012   Physical Exam  Gen: Well-appearing, in no acute distress; non-toxic CV: Regular Rate. Well-perfused. Warm.  Resp: Breathing unlabored on room air; no wheezing. Psych: Fluid speech in conversation; appropriate affect; normal thought process Neuro: Sensation intact throughout. No gross coordination deficits.   Ortho Exam -Lumbar spine: No significant scoliosis noted.  No overlying skin changes.  There is some tenderness to palpation around the spinous process and  surrounding para musculature in the 3-L5 level.  No SI joint TTP.  There is pain with endrange extension.  Negative straight leg raise, although positive painful modified slump test on the right.  There is some weakness with hip flexion on the right, although unsure whether true weakness versus weakness to pain.  The remainder of her strength testing is equivocal in the L3-S1 myotome.  2+ Achilles and patellar reflexes bilaterally. NVI.  Imaging: Epidural Steroid injection Magnus Sinning, MD     02/19/2022  7:39 PM Lumbosacral Transforaminal Epidural Steroid Injection -  Sub-Pedicular Approach with Fluoroscopic Guidance  Patient: Miranda [name]      Date of Birth: 1960-12-16 MRN: [id number] PCP: Darreld Mclean, MD      Visit Date: 02/11/2022   Universal Protocol:    Date/Time: 02/11/2022  Consent Given By: the patient  Position: PRONE  Additional Comments: Vital signs were monitored before and after the procedure. Patient was prepped and draped in the usual sterile fashion. The correct patient, procedure, and site was verified.  Injection Procedure Details:   Procedure diagnoses: Lumbar radiculopathy [M54.16]    Meds Administered:  Meds ordered this encounter  Medications   dexamethasone (DECADRON) injection 10 mg   Laterality: Right  Location/Site: L3  Needle:5.0 in., 22 ga.  Short  bevel or Quincke spinal needle  Needle Placement: Transforaminal  Findings:    -Comments: Excellent flow of contrast along the nerve, nerve  root and into the epidural space.  Procedure Details: After squaring off the end-plates to get a true AP view, the  C-arm was positioned so that an oblique view of the foramen as  noted above was visualized. The target area is just inferior to  the "nose of the scotty dog" or sub pedicular. The soft tissues  overlying this structure were infiltrated with 2-3 ml. of 1%  Lidocaine without Epinephrine.  The spinal needle was inserted  toward the target using a  "trajectory" view along the fluoroscope beam.  Under AP and  lateral visualization, the needle was advanced so it did not  puncture dura and was located close the 6 O'Clock position of the  pedical in AP tracterory. Biplanar projections were used to  confirm position. Aspiration was confirmed to be negative for CSF  and/or blood. A 1-2 ml. volume of Isovue-250 was injected and  flow of contrast was noted at each level. Radiographs were  obtained for documentation purposes.   After attaining the desired flow of contrast documented above, a  0.5 to 1.0 ml test dose of 0.25% Marcaine was injected into each  respective transforaminal space.  The patient was observed for 90  seconds post injection.  After no sensory deficits were reported,  and normal lower extremity motor function was noted,   the above  injectate was administered so that equal amounts of the injectate  were placed at each foramen (level) into the transforaminal  epidural space.  Additional Comments:  The patient tolerated the procedure well Dressing: 2 x 2 sterile gauze and Band-Aid    Post-procedure details: Patient was observed during the procedure. Post-procedure instructions were reviewed.  Patient left the clinic in stable condition.    Past Medical/Family/Surgical/Social History: Medications & Allergies reviewed per EMR, new medications updated. 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   ALLERGY 05/10/2007   Past Medical History:  Diagnosis Date    Abnormal Pap smear of cervix 1996   Allergy    Allergy, unspecified not elsewhere classified    IBS (irritable bowel syndrome)    Idiopathic hypersomnia    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   Family History  Adopted: Yes  Problem Relation Age of Onset   Cancer Mother        stomach   Heart disease Mother    Heart attack Mother    Heart disease Father    Colon cancer Neg Hx    Past Surgical History:  Procedure Laterality Date   CRYOTHERAPY     SHOULDER SURGERY     right   TONSILLECTOMY     Social History   Occupational History   Not on file  Tobacco Use   Smoking status: Never   Smokeless tobacco: Never  Vaping Use   Vaping Use: Never used  Substance and Sexual Activity   Alcohol use: No    Alcohol/week: 0.0 standard drinks of alcohol   Drug use: No  Sexual activity: Not Currently    Partners: Female    Birth control/protection: Post-menopausal

## 2022-02-21 NOTE — Telephone Encounter (Signed)
Patient Advocate Encounter   Received notification that prior authorization for Armodafinil 250MG  tablets is required/requested.  Per Test Claim: Insurance covers 1 tablet per day.    PA submitted on 02/21/22 to [employer] via CoverMyMeds Key [id number] Status is pending

## 2022-02-21 NOTE — Progress Notes (Signed)
Pain is much better;  Spasms in back have subsided, but still having radicular leg pain

## 2022-02-24 ENCOUNTER — Other Ambulatory Visit: Payer: Self-pay

## 2022-02-24 ENCOUNTER — Other Ambulatory Visit (HOSPITAL_COMMUNITY): Payer: Self-pay

## 2022-02-24 ENCOUNTER — Encounter: Payer: Self-pay | Admitting: Rehabilitative and Restorative Service Providers"

## 2022-02-24 ENCOUNTER — Ambulatory Visit: Payer: BC Managed Care – PPO | Admitting: Rehabilitative and Restorative Service Providers"

## 2022-02-24 DIAGNOSIS — R293 Abnormal posture: Secondary | ICD-10-CM

## 2022-02-24 DIAGNOSIS — M79604 Pain in right leg: Secondary | ICD-10-CM | POA: Diagnosis not present

## 2022-02-24 DIAGNOSIS — M5459 Other low back pain: Secondary | ICD-10-CM | POA: Diagnosis not present

## 2022-02-24 DIAGNOSIS — M6281 Muscle weakness (generalized): Secondary | ICD-10-CM

## 2022-02-24 NOTE — Telephone Encounter (Signed)
If insurance will pay for 1 tab daily, Would she be willing to pay out of pocket for the extra 15 tablets/ month that she could split to give her a dose of 1.5 tabs/ month??

## 2022-02-24 NOTE — Telephone Encounter (Addendum)
Patient Advocate Encounter  Received notification from [employer] that the request for prior authorization for Armodafinil 250MG  tablets has been denied due to plan does not allow off-label treatment of your diagnosis of a condition in which a person has trouble staying awake during the day (idiopathic hypersomnia)..     Per test claim: Drug is covered once daily instead of 1 and 1/2 tablets daily. Patient copay $0.00

## 2022-02-24 NOTE — Therapy (Addendum)
OUTPATIENT PHYSICAL THERAPY EVALUATION /DISCHARGE   Patient Name: Miranda [name] MRN: [id number] DOB:19-Dec-1960, 61 y.o., female Today's Date: 02/24/2022  END OF SESSION:    PT End of Session - 02/24/22 0845     Visit Number 1    Number of Visits 20    Date for PT Re-Evaluation 05/05/22    Authorization Type BC[employer] 20% coninsurance (29 left per note on evaluation)    Authorization - Visit Number 1    Authorization - Number of Visits 29    Progress Note Due on Visit 10    PT Start Time 0850    PT Stop Time 0922    PT Time Calculation (min) 32 min    Activity Tolerance Patient tolerated treatment well    Behavior During Therapy WFL for tasks assessed/performed             Past Medical History:  Diagnosis Date   Abnormal Pap smear of cervix 1996   Allergy    Allergy, unspecified not elsewhere classified    IBS (irritable bowel syndrome)    Idiopathic hypersomnia    RA (rheumatoid arthritis) ([place])    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   Past Surgical History:  Procedure Laterality Date   CRYOTHERAPY     SHOULDER SURGERY     right   TONSILLECTOMY     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   Undifferentiated connective tissue disease ([place]) 03/11/2011   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   ALLERGY 05/10/2007    PCP: Lamar Blinks MD  REFERRING PROVIDER: Elba Barman, DO  REFERRING DIAG: M54.16 (ICD-10-CM) - Lumbar radiculopathy  Rationale for Evaluation and Treatment Rehabilitation  THERAPY DIAG:  Other low back pain  Pain in right leg  Muscle weakness (generalized)  Abnormal posture  ONSET DATE: End of Aug 2023  SUBJECTIVE:                                                                                                                                                                                            SUBJECTIVE STATEMENT: Pt indicated having severe spasms as chief complaint with numbness and weakness in Rt leg.  Pt indicated having pain currently in Rt lower lumbar into Rt hip/groin and anterior Rt thigh. Insidious onset of symptoms.   Pt stated having numbness/tingling past knee prior to injection but  nothing past knee now.   Pt indicated history of off and on back trouble at times but nothing as severe as this time.    PERTINENT HISTORY:  History of recent ESI by Dr. Ernestina Patches  MRI IMPRESSION: 1. Small right foraminal disc extrusion at L3-4 with probable right L3 and possible right L4 nerve root encroachment. 2. No other significant spinal stenosis or definite nerve root encroachment. 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 3. Mild left foraminal narrowing at L5-S1.  PAIN:  NPRS scale: at current  5/10  at worst 10/10 Pain location: back/ Rt leg Pain description: constant ache, numbness/tingling Aggravating factors: prolonged sitting/standing, bending over, sleeping at times Relieving factors: injection helped, lying on heating pad/rest, OTC medicine mainly   PRECAUTIONS: None  WEIGHT BEARING RESTRICTIONS No  FALLS:  Has patient fallen in last 6 months? Yes - 1 fall when Rt leg gave out  LIVING ENVIRONMENT: Lives with: lives with their family Lives in: House/apartment Stairs: enter stairs - rail with 4 steps  OCCUPATION: Works from home with travel.    PLOF: Independent, workouts (free weights, cardio), yard work  PATIENT GOALS :  Reduce pain, get back to normal activity.    OBJECTIVE:   PATIENT SURVEYS:  02/24/2022 FOTO intake: 43   predicted:  60  SCREENING FOR RED FLAGS: 02/24/2022 Bowel or bladder incontinence:  No Cauda equina syndrome: No  COGNITION: 02/24/2022  Overall cognitive status: WFL normal    SENSATION: 02/24/2022 No specific testing today  MUSCLE LENGTH: 02/24/2022 None tested at this time  POSTURE:  02/24/2022  Noted lateral shift in standing and sitting to Lt away from pain.   PALPATION: 02/24/2022 Mild tenderness in low back Rt lumbar  LUMBAR ROM:   Active  AROM  02/24/2022  Flexion   Extension 50 % c end range pain lumbar  Repeated x 5 in standing, end range pain lumbar continued c improvement to 75% , no peripheralization noted.    Right lateral flexion   Left lateral flexion   Right rotation   Left rotation    (Blank rows = not tested)  LOWER EXTREMITY ROM:      Right 02/24/2022 Left 02/24/2022  Hip flexion    Hip extension    Hip abduction    Hip adduction    Hip internal rotation    Hip external rotation    Knee flexion    Knee extension    Ankle dorsiflexion    Ankle plantarflexion    Ankle inversion    Ankle eversion     (Blank rows = not tested)  LOWER EXTREMITY MMT:    MMT Right 02/24/2022 Left 02/24/2022  Hip flexion 4/5 5/5  Hip extension    Hip abduction    Hip adduction    Hip internal rotation    Hip external rotation    Knee flexion 4/5 5/5  Knee extension 4/5 5/5  Ankle dorsiflexion 5/5 5/5  Ankle plantarflexion    Ankle inversion    Ankle eversion     (Blank rows = not tested)  LUMBAR SPECIAL TESTS:  02/24/2022 (+) slump  FUNCTIONAL TESTS:  02/24/2022 18 inch chair s UE on 1st  GAIT: 02/24/2022 Independent ambulation within clinic.     TODAY'S TREATMENT  02/24/2022: Therex:    HEP instruction/performance c cues for techniques, handout provided.  Trial set performed of each for comprehension and symptom assessment.  See below for exercise list  Manual Lateral  shift correction moving shoulders to Rt 2-3 sec holds    PATIENT EDUCATION:  Education details: HEP, POC Person educated: Patient Education method:  Explanation, Demonstration, Verbal cues, and Handouts Education comprehension: verbalized understanding, returned demonstration, and verbal cues required    HOME EXERCISE PROGRAM: Access Code: [email or web address] URL: [email or web address] Date: 02/24/2022 Prepared by: Scot Jun  Exercises - Left Standing Lateral Shift Correction at Wall - Repetitions  - 2-3 x daily - 7 x weekly - 1 sets - 5-10 reps - 3-5 hold - Standing Lumbar Extension with Counter  - 2-3 x daily - 7 x weekly - 1 sets - 5-10 reps - 1-2 hold - Supine 90/90 Sciatic Nerve Glide with Knee Flexion/Extension  - 1-2 x daily - 7 x weekly - 1-2 sets - 10 reps  ASSESSMENT:  CLINICAL IMPRESSION: Patient is a 61 y.o. who comes to clinic with complaints of low back pain c Rt leg symptoms with presence of lateral shift, possible direction specific response to extension with mobility, strength and deficits that impair their ability to perform usual daily and recreational functional activities without increase difficulty/symptoms at this time.  Patient to benefit from skilled PT services to address impairments and limitations to improve to previous level of function without restriction secondary to condition.    OBJECTIVE IMPAIRMENTS Abnormal gait, decreased activity tolerance, decreased coordination, decreased endurance, decreased mobility, difficulty walking, decreased ROM, decreased strength, hypomobility, impaired perceived functional ability, increased muscle spasms, impaired flexibility, improper body mechanics, and pain.   ACTIVITY LIMITATIONS carrying, lifting, bending, sitting, standing, squatting, sleeping, stairs, transfers, bed mobility, and locomotion level  PARTICIPATION LIMITATIONS: meal prep, cleaning, laundry, interpersonal relationship, shopping, community activity, occupation, and yard work  PERSONAL FACTORS  RA, IBS  are also affecting patient's functional outcome.   REHAB POTENTIAL: Good  CLINICAL  DECISION MAKING: Stable/uncomplicated  EVALUATION COMPLEXITY: Low   GOALS: Goals reviewed with patient? Yes  Short term PT Goals (target date for Short term goals are 3 weeks 03/17/2022) Patient will demonstrate independent use of home exercise program to maintain progress from in clinic treatments. Goal status: New   Long term PT goals (target dates for all long term goals are 10 weeks  05/05/2022 )   1. Patient will demonstrate/report pain at worst less than or equal to 2/10 to facilitate minimal limitation in daily activity secondary to pain symptoms. Goal status: New   2. Patient will demonstrate independent use of home exercise program to facilitate ability to maintain/progress functional gains from skilled physical therapy services. Goal status: New   3. Patient will demonstrate FOTO outcome > or = 60 % to indicate reduced disability due to condition. Goal status: New   4. Patient will demonstrate lumbar extension 100 % WFL s symptoms to facilitate upright standing, walking posture at PLOF s limitation. Goal status: New   5.  Patient will demonstrate Rt leg MMT equal to Lt 5/5 to facilitate return to PLOF in exercise, daily movement.   Goal status: New   6.  Patient will demonstrate/report ability to perform stairs, ambulation, standing at PLOF s restriction.   Goal status: New      PLAN: PT FREQUENCY: 1-2x/week  PT DURATION: 10 weeks  PLANNED INTERVENTIONS: Therapeutic exercises, Therapeutic activity, Neuro Muscular re-education, Balance training, Gait training, Patient/Family education, Joint mobilization, Stair training, DME instructions, Dry Needling, Electrical stimulation, Cryotherapy, Moist heat, Taping, Traction Ultrasound, Ionotophoresis 56m/ml Dexamethasone, and Manual therapy.  All included unless contraindicated   PLAN FOR NEXT SESSION: Review HEP knowledge/results.  Check lateral shift/centralization response.    Scot Jun, PT, DPT, OCS,  ATC 02/24/22  9:42 AM  PHYSICAL THERAPY DISCHARGE SUMMARY  Visits from Start of Care: 1  Current functional level related to goals / functional outcomes: See note   Remaining deficits: See note   Education / Equipment: HEP  Patient goals were not met. Patient is being discharged due to not returning since the last visit.  Scot Jun, PT, DPT, OCS, ATC 04/01/22  11:46 AM

## 2022-02-25 ENCOUNTER — Other Ambulatory Visit (HOSPITAL_COMMUNITY): Payer: Self-pay

## 2022-02-25 NOTE — Telephone Encounter (Signed)
I wonder if generic would be cheaper, event though her insurance prefers brand name. We could give regular-release adderall to use to supplement her 1 Adderall XR 30/ day if she would like to try that.

## 2022-02-25 NOTE — Telephone Encounter (Signed)
The out of pocket cost for 15 tablets is $354.36.

## 2022-02-26 ENCOUNTER — Other Ambulatory Visit (HOSPITAL_COMMUNITY): Payer: Self-pay

## 2022-02-26 ENCOUNTER — Other Ambulatory Visit: Payer: BC Managed Care – PPO

## 2022-02-26 NOTE — Telephone Encounter (Signed)
Modafinil is covered through insurance if you would like to consider changing. Test claim for 200mg  at 1.5 a day is $0.00 co-pay

## 2022-02-27 ENCOUNTER — Ambulatory Visit: Payer: BC Managed Care – PPO | Admitting: Orthopedic Surgery

## 2022-02-27 ENCOUNTER — Encounter: Payer: Self-pay | Admitting: Orthopedic Surgery

## 2022-02-27 ENCOUNTER — Telehealth: Payer: Self-pay

## 2022-02-27 ENCOUNTER — Ambulatory Visit (INDEPENDENT_AMBULATORY_CARE_PROVIDER_SITE_OTHER): Payer: BC Managed Care – PPO

## 2022-02-27 VITALS — BP 116/80 | HR 99 | Ht 64.0 in | Wt 144.0 lb

## 2022-02-27 DIAGNOSIS — M5416 Radiculopathy, lumbar region: Secondary | ICD-10-CM

## 2022-02-27 DIAGNOSIS — M5116 Intervertebral disc disorders with radiculopathy, lumbar region: Secondary | ICD-10-CM

## 2022-02-27 NOTE — Progress Notes (Signed)
Orthopedic Spine Surgery Office Note  Assessment: Patient is a 61 y.o. female with right-sided L3 radiculopathy due to L3-4 foraminal disc herniation   Plan: -Explained that initially conservative treatment is tried as a significant number of patients may experience relief with these treatment modalities. Discussed that the conservative treatments include:  -activity modification  -physical therapy  -over the counter pain medications  -medrol dosepak  -lumbar steroid injections -Patient has tried transforaminal injections, home exercises, activity modification,  medrol dosepak, OTC pain medications -Patient felt that her symptoms were improving even before the injection, it has only been 6 weeks, and her weakness is nonprogressive so recommended continued monitoring -Patient should return to office in 6 weeks, repeat x-rays of lumbar spine at next visit: none   Patient expressed understanding of the plan and all questions were answered to their satisfaction.   ___________________________________________________________________________   History:  Patient is a 61 y.o. female who presents today for lumbar spine.  Patient states that her pain started about 6 weeks ago.  There was no trauma or injury that brought on the pain.  She feels the pain in her lower back going into her right anterior thigh to about the knee level.  She has decreased sensation in that same distribution.  She had tingling in that distribution but that is since resolved.  Her pain was significant at the outset to the point that she had modify her activities on a daily basis.  However, as time has gone on her symptoms have decreased in severity.  She is now able to get more active.  She did have an injection about 2 weeks ago and stated that even before that injection she noticed that her symptoms were getting better.  The injection provided her with even a little more symptomatic relief.  She has noticed some weakness in  her right quadriceps.  She has not had any symptoms on her left side.   Weakness: yes, feels her right leg is slightly weaker in the quadriceps Symptoms of imbalance: denies Paresthesias and numbness: yes, decreased sensation the her right anterior thigh. Had tingling in the same area but that has gone away Bowel or bladder incontinence: denies Saddle anesthesia: denies  Treatments tried: home exercises, activity modification, tranforaminal injection, OTC pain medications,medrol dosepak  Review of systems: Denies fevers and chills, night sweats, unexplained weight loss, history of cancer, pain that wakes them at night  Past medical history: Seronegative arthritis Palpitations  Allergies: keflex  Past surgical history:  Tonsillectomy Shoulder surgery  Social history: Denies use of nicotine product (smoking, vaping, patches, smokeless) Alcohol use: denies Denies recreational drug use   Physical Exam:  General: no acute distress, appears stated age Neurologic: alert, answering questions appropriately, following commands Respiratory: unlabored breathing on room air, symmetric chest rise Psychiatric: appropriate affect, normal cadence to speech   MSK (spine):  -Strength exam      Left  Right EHL    5/5  5/5 TA    5/5  5/5 GSC    5/5  5/5 Knee extension  5/5  4+/5 Hip flexion   5/5  5/5  -Sensory exam    Sensation intact to light touch in L3-S1 nerve distributions of bilateral lower extremities (decreased sensation in L3 distribution on the right)  -Achilles DTR: 2/4 on the left, 2/4 on the right -Patellar tendon DTR: 2/4 on the left, 2/4 on the right  -Straight leg raise: negative -Contralateral straight leg raise: negative -Femoral nerve stretch test: positive on the  right, negative on the left -Clonus: no beats bilaterally  -Left hip exam: minimal pain with internal rotation at the hip (does not recreate the pain she presented for), no pain with external  rotation, negative stinchfield, negative FABER -Right hip exam: no pain through range of motion at the hip, negative stinchfield, negative FABER  Imaging: XR of the lumbar spine from 02/27/2022 and 01/22/2022 was independently reviewed and interpreted, showing disc height loss at L3-4, L4-5, L5-S1.  The most significant degenerative disc height loss is at L4-5.  Facet arthropathy at L4-5 and L5-S1.  No evidence of instability.  No acute osseous abnormality.  Lordotic alignment.  MRI of the lumbar spine from 02/01/2022 was independently reviewed and interpreted, showing degenerative disc disease at L3-4, L4-5, L5-S1.  There is a disc herniation on the right side at L3-4 in the foraminal region.  There is foraminal stenosis on the right side at L3-4 and bilaterally at L4-5.  There is lateral recess stenosis at L3-4 and at L4-5.   Patient name: Miranda [name] Patient MRN: [id number] Date of visit: 02/27/22

## 2022-03-02 ENCOUNTER — Encounter: Payer: Self-pay | Admitting: Internal Medicine

## 2022-03-03 NOTE — Telephone Encounter (Signed)
Dr. Annamaria Boots, please see mychart message sent by pt along with the attached media and advise.

## 2022-03-04 ENCOUNTER — Encounter: Payer: BC Managed Care – PPO | Admitting: Rehabilitative and Restorative Service Providers"

## 2022-03-11 ENCOUNTER — Encounter: Payer: BC Managed Care – PPO | Admitting: Rehabilitative and Restorative Service Providers"

## 2022-03-18 ENCOUNTER — Other Ambulatory Visit: Payer: Self-pay | Admitting: Family Medicine

## 2022-03-18 ENCOUNTER — Encounter: Payer: BC Managed Care – PPO | Admitting: Rehabilitative and Restorative Service Providers"

## 2022-03-18 DIAGNOSIS — M62838 Other muscle spasm: Secondary | ICD-10-CM

## 2022-03-25 ENCOUNTER — Encounter: Payer: BC Managed Care – PPO | Admitting: Rehabilitative and Restorative Service Providers"

## 2022-03-27 ENCOUNTER — Other Ambulatory Visit: Payer: Self-pay | Admitting: Internal Medicine

## 2022-03-27 MED ORDER — AMPHETAMINE-DEXTROAMPHETAMINE 10 MG PO TABS: ORAL_TABLET | ORAL | 0 refills | Status: DC

## 2022-03-27 MED ORDER — AMPHETAMINE-DEXTROAMPHET ER 30 MG PO CP24: 30.0000 mg | ORAL_CAPSULE | Freq: Every day | ORAL | 0 refills | Status: DC

## 2022-03-27 MED ORDER — MODAFINIL 100 MG PO TABS: 100.0000 mg | ORAL_TABLET | Freq: Every day | ORAL | 5 refills | Status: DC

## 2022-03-27 NOTE — Telephone Encounter (Signed)
Adderall scripts refilled 

## 2022-03-27 NOTE — Telephone Encounter (Signed)
I have sent script for modafinil (Provigil) to her drug store.  If it is turned down that may trigger the prior authorization process for [place]. Please clarify with Miranda [name]- I think we were ordering this when drugstore did not have adderall, so is that supply problem better now?

## 2022-03-27 NOTE — Telephone Encounter (Signed)
Dr. Annamaria Boots, please advise on pt's message. Pt states she is already taking Adderall 30mg  and 10mg . Thanks.

## 2022-04-10 ENCOUNTER — Ambulatory Visit: Payer: BC Managed Care – PPO | Admitting: Orthopedic Surgery

## 2022-04-11 ENCOUNTER — Other Ambulatory Visit: Payer: Self-pay | Admitting: Family Medicine

## 2022-04-11 DIAGNOSIS — F39 Unspecified mood [affective] disorder: Secondary | ICD-10-CM

## 2022-05-09 ENCOUNTER — Encounter: Payer: Self-pay | Admitting: Internal Medicine

## 2022-05-09 MED ORDER — WAKIX 17.8 MG PO TABS: ORAL_TABLET | ORAL | 1 refills | Status: DC

## 2022-05-09 NOTE — Telephone Encounter (Signed)
I have sent Wakix to her drug store. Try this instead of Provigil or Adderall.

## 2022-05-09 NOTE — Telephone Encounter (Signed)
Received the following message from patient:   "Dr. Maple Hudson, The Provigil doesn't work nearly as well as the [employer]. Is there anything else that may be available?   Thanks, Tonie"  Dr. Maple Hudson, can you please advise? Thanks!

## 2022-05-12 ENCOUNTER — Other Ambulatory Visit (HOSPITAL_COMMUNITY): Payer: Self-pay

## 2022-05-14 ENCOUNTER — Telehealth: Payer: Self-pay

## 2022-05-14 ENCOUNTER — Other Ambulatory Visit (HOSPITAL_COMMUNITY): Payer: Self-pay

## 2022-05-14 NOTE — Telephone Encounter (Signed)
PA request submitted via CMM through [facility] for Wakix 17.8MG  tablets  PA is pending determination   Key: [id number] - PA Case ID: [id number]

## 2022-05-15 NOTE — Telephone Encounter (Signed)
PA has been DENIED for Wakix 17.8mg  due to:  Per your health plan's criteria, this drug is covered if you meet the following: (1) One of the following: (A) Your doctor provides medical records showing both of the following: (I) You had a sleep test (polysomnography and/or multiple sleep latency test confirming diagnosis and documenting symptoms). (II) Your sleep test shows certain results (you have a mean sleep latency of less than or equal to eight minutes and two or more sleep onset rapid eye movement periods). (B) Your doctor gives [place] a reason why you cannot have a sleep test. (2) You have tried and failed [place] for 60 days. (3) Your doctor provides medical records (for example: chart notes, laboratory values) showing you have been screened for alcohol, drug, or other substance abuse. (4) The drug is prescribed or recommended by a doctor who specializes in brain and nerve disorders, mental disorders, or sleep (neurologist, psychiatrist, sleep medicine specialist). The information provided does not show that you meet the criteria listed above.

## 2022-06-08 ENCOUNTER — Other Ambulatory Visit: Payer: Self-pay | Admitting: Internal Medicine

## 2022-06-09 MED ORDER — AMPHETAMINE-DEXTROAMPHETAMINE 10 MG PO TABS: ORAL_TABLET | ORAL | 0 refills | Status: DC

## 2022-06-09 MED ORDER — MODAFINIL 100 MG PO TABS: 100.0000 mg | ORAL_TABLET | Freq: Every day | ORAL | 5 refills | Status: DC

## 2022-06-09 MED ORDER — AMPHETAMINE-DEXTROAMPHET ER 30 MG PO CP24: 30.0000 mg | ORAL_CAPSULE | Freq: Every day | ORAL | 0 refills | Status: DC

## 2022-06-09 NOTE — Telephone Encounter (Signed)
Mychart message sent by pt: Miranda [name] [facility] ([place], MD)17 hours ago (3:49 PM)    Dr. Annamaria Boots, My insurance denied the new medication [place]. For the time being can I get the Provigil refilled. Miranda [name]    Dr. Annamaria Boots, please advise.

## 2022-06-09 NOTE — Telephone Encounter (Signed)
Refills sent for adderall

## 2022-06-09 NOTE — Telephone Encounter (Signed)
Provigil refilled.

## 2022-06-24 ENCOUNTER — Encounter: Payer: Self-pay | Admitting: Family

## 2022-06-24 ENCOUNTER — Ambulatory Visit: Payer: BC Managed Care – PPO | Admitting: Family

## 2022-06-24 VITALS — BP 116/70 | HR 94 | Resp 18 | Ht 64.0 in | Wt 151.4 lb

## 2022-06-24 DIAGNOSIS — J019 Acute sinusitis, unspecified: Secondary | ICD-10-CM | POA: Diagnosis not present

## 2022-06-24 MED ORDER — DOXYCYCLINE HYCLATE 100 MG PO TABS: 100.0000 mg | ORAL_TABLET | Freq: Two times a day (BID) | ORAL | 0 refills | Status: DC

## 2022-06-24 NOTE — Progress Notes (Signed)
Miranda [name] is a 62 y.o. 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 31 capsule 0   amphetamine-dextroamphetamine (ADDERALL) 10 MG tablet 1 or 2 daily if needed 60 tablet 0   cyclobenzaprine (FLEXERIL) 10 MG tablet TAKE 1 TABLET BY MOUTH TWICE A DAY AS NEEDED FOR MUSCLE SPASMS 30 tablet 0   doxycycline (VIBRA-TABS) 100 MG tablet Take 1 tablet (100 mg total) by mouth 2 (two) times daily. 14 tablet 0   escitalopram (LEXAPRO) 20 MG tablet TAKE 1 TABLET(20 MG) BY MOUTH DAILY 90 tablet 0   estradiol (VIVELLE-DOT) 0.05 MG/24HR patch Place 1 patch (0.05 mg total) onto the skin 2 (two) times a week. 24 patch 4   Ibuprofen (ADVIL PO) Take 200 mg by mouth as needed (pain).      modafinil (PROVIGIL) 100 MG tablet Take 1 tablet (100 mg total) by mouth daily. 30 tablet 5   progesterone (PROMETRIUM) 100 MG capsule Take 1 capsule (100 mg total) by mouth at bedtime. 90 capsule 4   traMADol (ULTRAM) 50 MG tablet Take 1 tablet (50 mg total) by mouth every 6 (six) hours as needed for severe pain. (Patient not taking: Reported on 06/24/2022) 15 tablet 0   No current facility-administered medications for this visit.    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    Past Surgical History:  Procedure Laterality Date   CRYOTHERAPY     SHOULDER SURGERY     right   TONSILLECTOMY      Family History  Adopted: Yes  Problem Relation Age of Onset   Cancer Mother        stomach   Heart disease Mother    Heart attack Mother    Heart disease Father    Colon cancer Neg Hx     Social History   Tobacco Use   Smoking status: Never   Smokeless tobacco: Never  Substance Use Topics   Alcohol use: No    Alcohol/week: 0.0 standard drinks of alcohol    Subjective:   Presents today with concerns for sinus infection; symptoms x 2 weeks; using OTC Tylenol/ Advil; low grade fever on and off; no chest pain/ pressure;   Objective:  Vitals:   06/24/22 1440  BP: 116/70  Pulse: 94  Resp: 18  SpO2: 97%  Weight: 151 lb 6.4 oz (68.7 kg)  Height: 5\' 4"  (1.626 m)    General: Well developed, well nourished, in no acute distress  Skin : Warm and dry.  Head: Normocephalic and atraumatic  Eyes: Sclera and conjunctiva clear; pupils round and reactive to light;  extraocular movements intact  Ears: External normal; canals clear; tympanic membranes normal  Oropharynx: Pink, supple. No suspicious lesions  Neck: Supple without thyromegaly, adenopathy  Lungs: Respirations unlabored; clear to auscultation bilaterally without wheeze, rales, rhonchi  CVS exam: normal rate and regular rhythm.  Neurologic: Alert and oriented; speech intact; face symmetrical; moves all extremities well; CNII-XII intact without focal deficit   Assessment:  1. Acute sinusitis, recurrence not specified, unspecified location     Plan:  Rx for Doxycycline 100 mg bid x 7 days; encouraged to use Flonase qd; increase fluids, rest and follow up worse, no better.   No follow-ups on file.  No orders of the defined types  were placed in this encounter.   Requested Prescriptions   Signed Prescriptions Disp Refills   doxycycline (VIBRA-TABS) 100 MG tablet 14 tablet 0    Sig: Take 1 tablet (100 mg total) by mouth 2 (two) times daily.

## 2022-07-16 ENCOUNTER — Other Ambulatory Visit: Payer: Self-pay | Admitting: Family Medicine

## 2022-07-16 DIAGNOSIS — M62838 Other muscle spasm: Secondary | ICD-10-CM

## 2022-08-05 ENCOUNTER — Other Ambulatory Visit: Payer: Self-pay | Admitting: Internal Medicine

## 2022-08-07 MED ORDER — AMPHETAMINE-DEXTROAMPHET ER 30 MG PO CP24: 30.0000 mg | ORAL_CAPSULE | Freq: Every day | ORAL | 0 refills | Status: DC

## 2022-08-07 NOTE — Telephone Encounter (Signed)
Adderall refilled.

## 2022-08-08 IMAGING — CT CT NECK W/ CM
3 of 4 series · 14 of 33 positions shown, 17 images · IV contrast (Omnipaque)
Comparison: None Available.

CLINICAL DATA: Palate weakness. Difficulty swallowing. Neck pain
and dysphagia.

EXAM:
CT NECK WITH CONTRAST
TECHNIQUE: Multidetector CT imaging of the neck was performed using the
standard protocol following the bolus administration of intravenous
contrast.

[Series 6: sag neck · sagittal · 0.40mm/px · 5 of 80 slices shown, 6 images]
[im 27/80  bone]
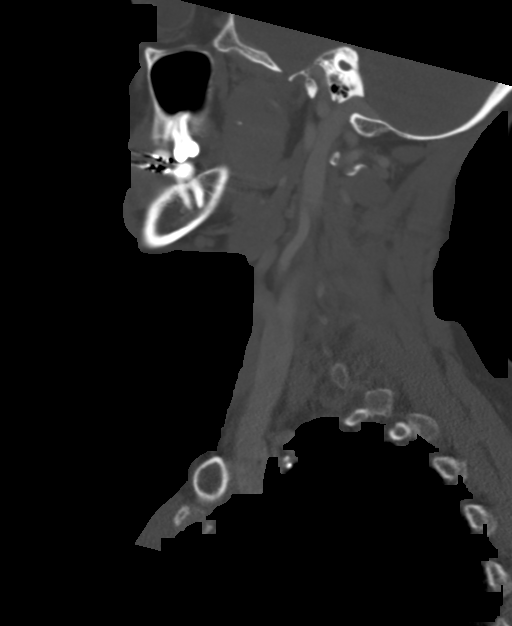
[im 33/80  bone]
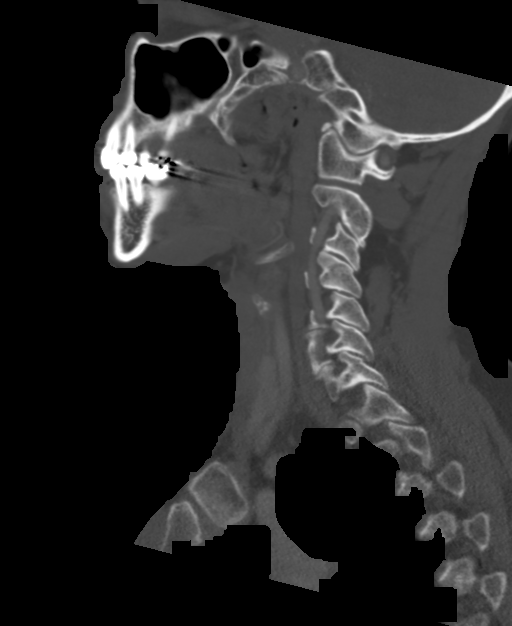
[im 40/80  soft-tissue]
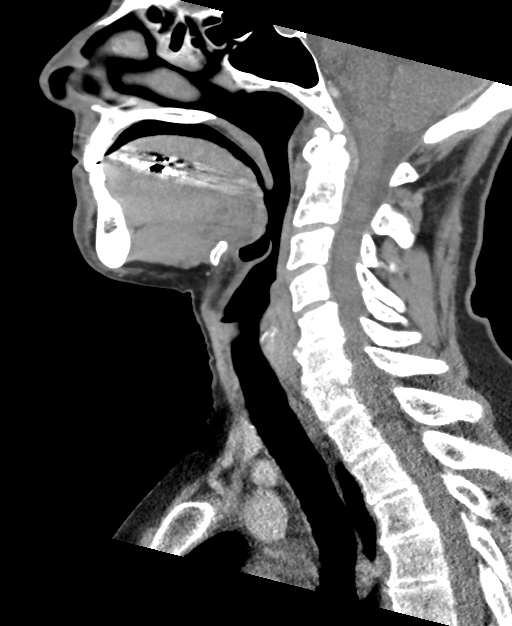
[im 40/80  bone]
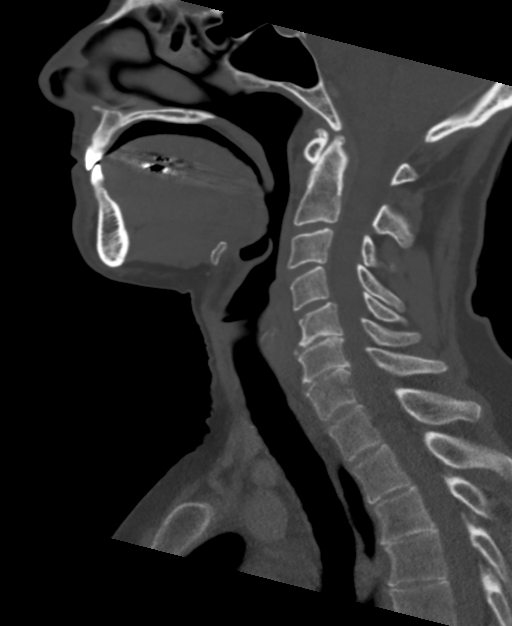
[im 47/80  bone]
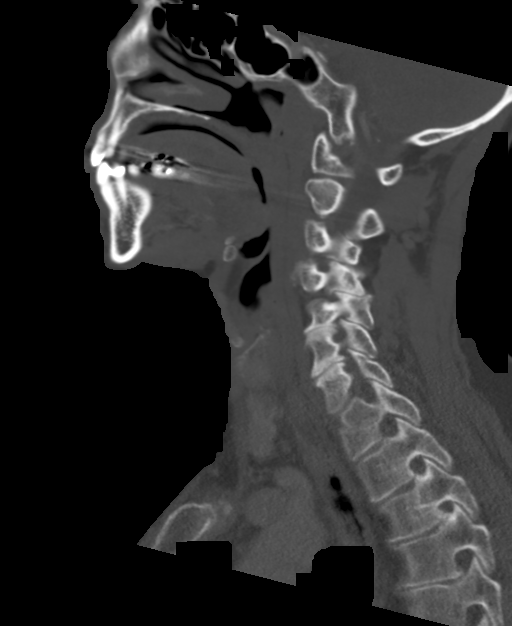
[im 53/80  bone]
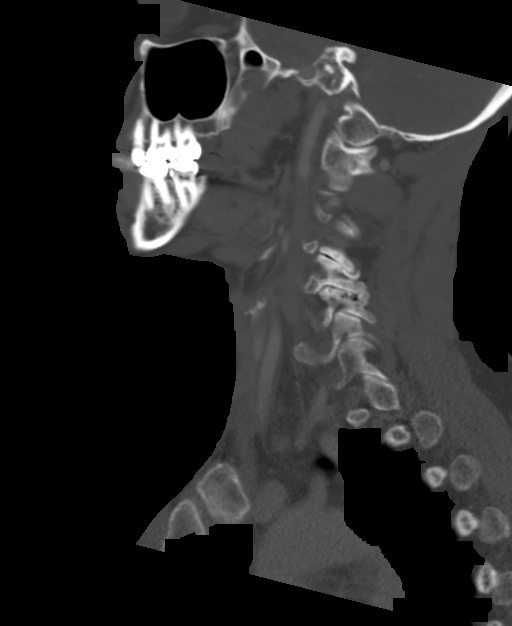

[Series 7: cor neck · coronal · 0.31mm/px · 3 of 95 slices shown]
[im 22/95  bone]
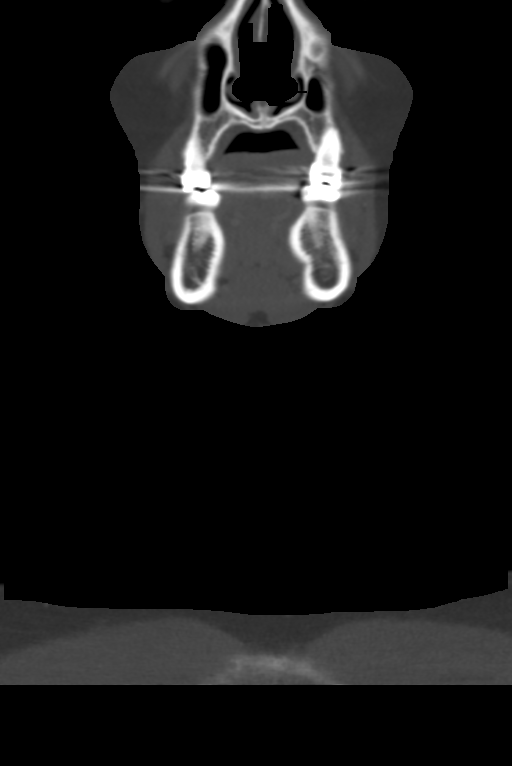
[im 39/95  bone]
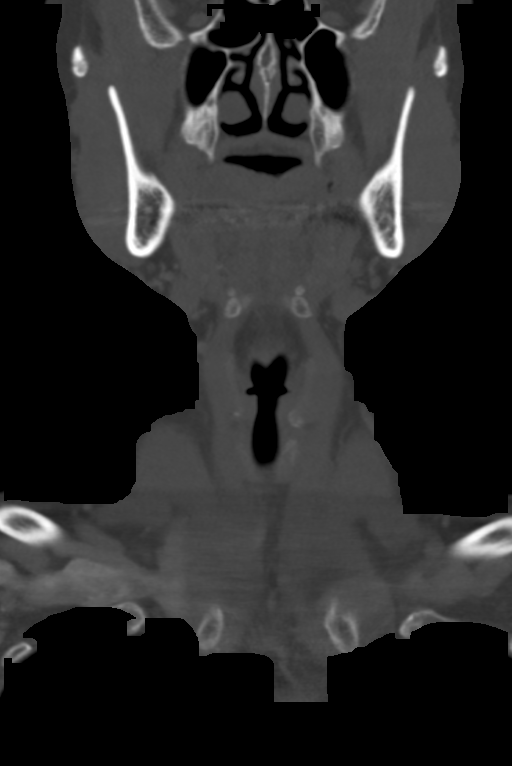
[im 56/95  bone]
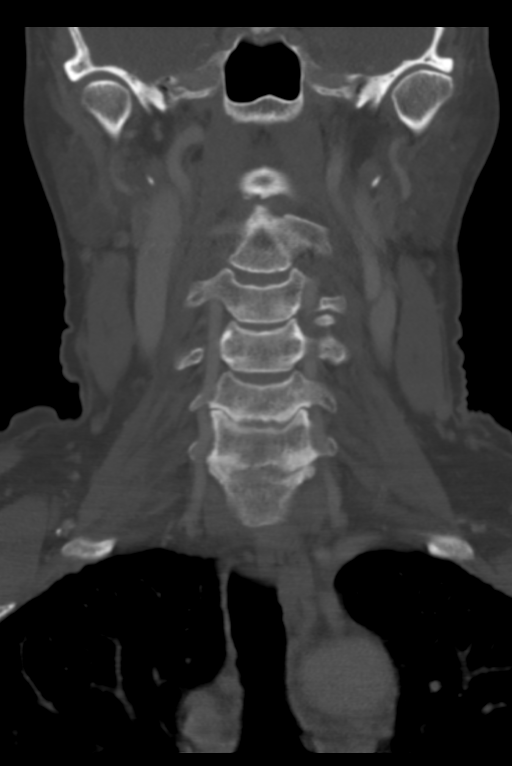

[Series 8: ax oropharynx · axial · 0.39mm/px · z∈[+1202,+1384]mm · 6 of 134 slices shown, 8 images]
[im 20/134  soft-tissue]
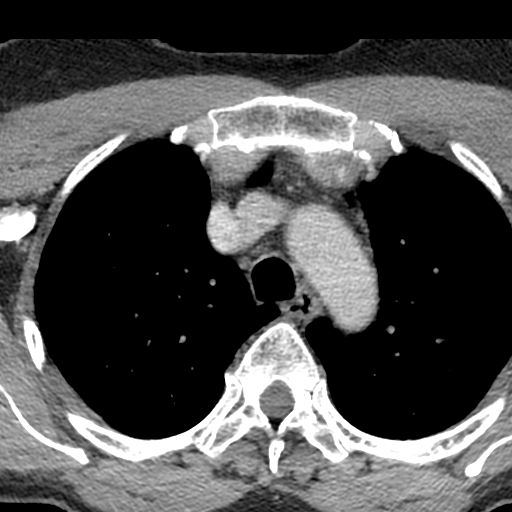
[im 20/134  bone]
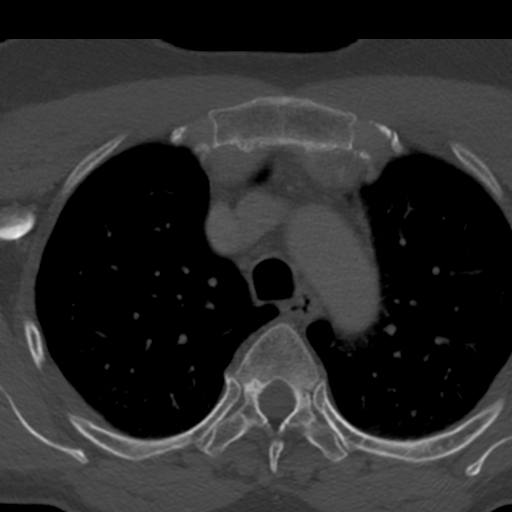
[im 39/134  bone]
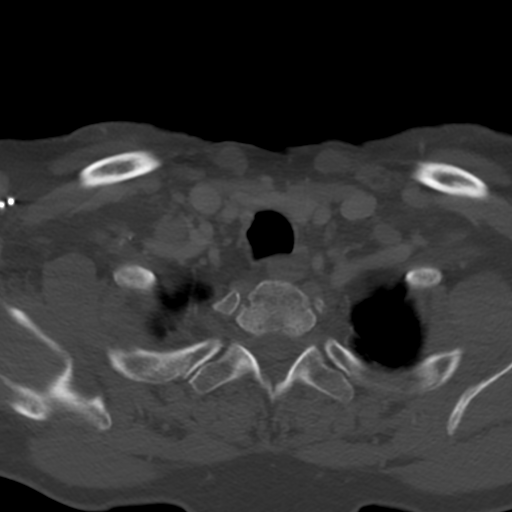
[im 58/134  bone]
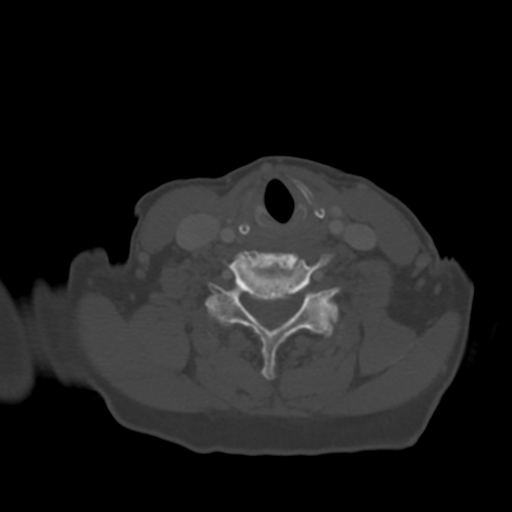
[im 77/134  bone]
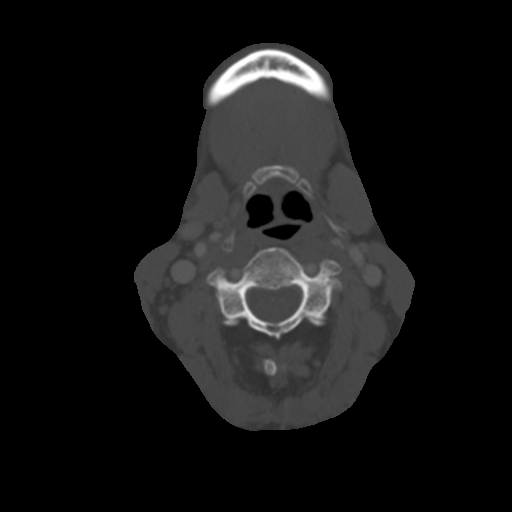
[im 96/134  soft-tissue]
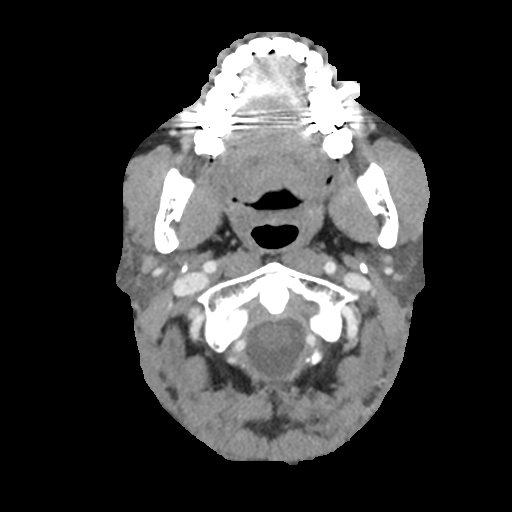
[im 96/134  bone]
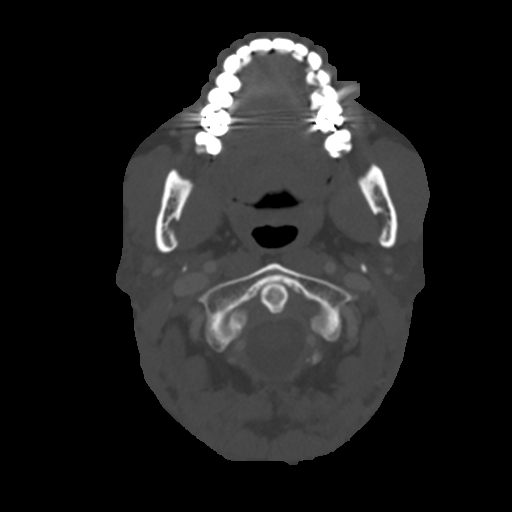
[im 115/134  bone]
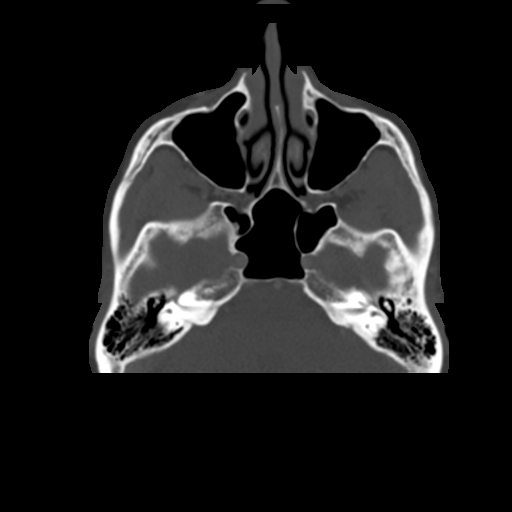

[14 of 33 positions shown; findings below may reference images not displayed]

RADIATION DOSE REDUCTION: This exam was performed according to the
departmental dose-optimization program which includes automated
exposure control, adjustment of the mA and/or kV according to
patient size and/or use of iterative reconstruction technique.

CONTRAST:  75mL OMNIPAQUE IOHEXOL 300 MG/ML  SOLN
FINDINGS: Pharynx and larynx: Normal. No mass or swelling.

Salivary glands: No inflammation, mass, or stone.

Thyroid: Normal.

Lymph nodes: None enlarged or abnormal density.

Vascular: Negative.

Limited intracranial: Negative.

Visualized orbits: Negative.

Mastoids and visualized paranasal sinuses: Clear.

Skeleton: No acute or aggressive process. No anterior osteophyte. No
skull base lesion.

Upper chest: Negative.

Other: None.
IMPRESSION: Normal CT of the neck. No anterior cervical osteophytes or skull
base lesion.

## 2022-09-30 ENCOUNTER — Other Ambulatory Visit: Payer: Self-pay | Admitting: Internal Medicine

## 2022-09-30 ENCOUNTER — Other Ambulatory Visit: Payer: Self-pay | Admitting: Family Medicine

## 2022-09-30 DIAGNOSIS — F39 Unspecified mood [affective] disorder: Secondary | ICD-10-CM

## 2022-10-01 NOTE — Telephone Encounter (Signed)
Dr. Maple Hudson, please advise if you are ok with these refills?

## 2022-10-02 MED ORDER — AMPHETAMINE-DEXTROAMPHETAMINE 10 MG PO TABS: ORAL_TABLET | ORAL | 0 refills | Status: DC

## 2022-10-02 MED ORDER — AMPHETAMINE-DEXTROAMPHET ER 30 MG PO CP24: 30.0000 mg | ORAL_CAPSULE | Freq: Every day | ORAL | 0 refills | Status: DC

## 2022-10-02 NOTE — Telephone Encounter (Signed)
[facility] refills sent to [facility]

## 2022-10-09 ENCOUNTER — Other Ambulatory Visit: Payer: Self-pay | Admitting: Family Medicine

## 2022-10-09 DIAGNOSIS — Z1231 Encounter for screening mammogram for malignant neoplasm of breast: Secondary | ICD-10-CM

## 2022-10-10 ENCOUNTER — Other Ambulatory Visit: Payer: Self-pay | Admitting: Family Medicine

## 2022-10-10 ENCOUNTER — Ambulatory Visit
Admission: RE | Admit: 2022-10-10 | Discharge: 2022-10-10 | Disposition: A | Discharge status: Home / Self Care | Payer: BC Managed Care – PPO | Source: Ambulatory Visit | Attending: Family Medicine | Admitting: Family Medicine

## 2022-10-10 DIAGNOSIS — Z1231 Encounter for screening mammogram for malignant neoplasm of breast: Secondary | ICD-10-CM | POA: Diagnosis not present

## 2022-10-10 DIAGNOSIS — F39 Unspecified mood [affective] disorder: Secondary | ICD-10-CM

## 2022-10-28 ENCOUNTER — Ambulatory Visit: Payer: BC Managed Care – PPO | Admitting: Radiology

## 2022-11-06 ENCOUNTER — Ambulatory Visit (INDEPENDENT_AMBULATORY_CARE_PROVIDER_SITE_OTHER): Payer: BC Managed Care – PPO | Admitting: Radiology

## 2022-11-06 ENCOUNTER — Encounter: Payer: Self-pay | Admitting: Radiology

## 2022-11-06 VITALS — BP 116/74 | Ht 62.25 in | Wt 141.0 lb

## 2022-11-06 DIAGNOSIS — Z01419 Encounter for gynecological examination (general) (routine) without abnormal findings: Secondary | ICD-10-CM

## 2022-11-06 DIAGNOSIS — N951 Menopausal and female climacteric states: Secondary | ICD-10-CM

## 2022-11-06 DIAGNOSIS — N3946 Mixed incontinence: Secondary | ICD-10-CM | POA: Diagnosis not present

## 2022-11-06 DIAGNOSIS — Z79890 Hormone replacement therapy (postmenopausal): Secondary | ICD-10-CM | POA: Diagnosis not present

## 2022-11-06 MED ORDER — PROGESTERONE MICRONIZED 100 MG PO CAPS: 100.0000 mg | ORAL_CAPSULE | Freq: Every evening | ORAL | 4 refills | Status: DC

## 2022-11-06 MED ORDER — ESTRADIOL 0.05 MG/24HR TD PTTW
1.0000 | MEDICATED_PATCH | TRANSDERMAL | 4 refills | Status: DC
  Filled 2023-10-06: qty 24, 84d supply, fill #0

## 2022-11-06 NOTE — Progress Notes (Signed)
   Miranda [name] 12/18/1960 [id number]   History: Postmenopausal 62 y.o. presents for annual exam. No new gyn concerns. Doing well on HRT. Has some stress and urge incontinence which seems to be getting worse.   Gynecologic History Postmenopausal Last Pap: 2023. Results were: normal Last mammogram: 2024. Results were: normal Last colonoscopy: 2016 HRT use: current  Obstetric History OB History  Gravida Para Term Preterm AB Living  0 0 0 0 0 0  SAB IAB Ectopic Multiple Live Births  0 0 0 0    Obstetric Comments  No pregnancy but 1 adopted     The following portions of the patient's history were reviewed and updated as appropriate: allergies, current medications, past family history, past medical history, past social history, past surgical history, and problem list.  Review of Systems Pertinent items noted in HPI and remainder of comprehensive ROS otherwise negative.  Past medical history, past surgical history, family history and social history were all reviewed and documented in the EPIC chart.  Exam:  Vitals:   11/06/22 1332  BP: 116/74  Weight: 141 lb (64 kg)  Height: 5' 2.25" (1.581 m)   Body mass index is 25.58 kg/m.  General appearance:  Normal Thyroid:  Symmetrical, normal in size, without palpable masses or nodularity. Respiratory  Auscultation:  Clear without wheezing or rhonchi Cardiovascular  Auscultation:  Regular rate, without rubs, murmurs or gallops  Edema/varicosities:  Not grossly evident Abdominal  Soft,nontender, without masses, guarding or rebound.  Liver/spleen:  No organomegaly noted  Hernia:  None appreciated  Skin  Inspection:  Grossly normal Breasts: Examined lying and sitting.   Right: Without masses, retractions, nipple discharge or axillary adenopathy.   Left: Without masses, retractions, nipple discharge or axillary adenopathy. Genitourinary   Inguinal/mons:  Normal without inguinal adenopathy  External genitalia:  Normal  appearing vulva with no masses, tenderness, or lesions  BUS/Urethra/Skene's glands:  Normal  Vagina:  Normal appearing with normal color and discharge, no lesions.    Cervix:  Normal appearing without discharge or lesions  Uterus:  Normal in size, shape and contour.  Midline and mobile, nontender  Adnexa/parametria:     Rt: Normal in size, without masses or tenderness.   Lt: Normal in size, without masses or tenderness.  Anus and perineum: Normal    Raynelle Fanning, CMA present for exam  Assessment/Plan:   1. Well woman exam with routine gynecological exam Pap due 2028  2. Mixed stress and urge urinary incontinence Recommend PFPT, will consider  3. Hormone replacement therapy (HRT) Doing well, will continue  4. Vasomotor symptoms due to menopause - estradiol (VIVELLE-DOT) 0.05 MG/24HR patch; Place 1 patch (0.05 mg total) onto the skin 2 (two) times a week.  Dispense: 24 patch; Refill: 4 - progesterone (PROMETRIUM) 100 MG capsule; Take 1 capsule (100 mg total) by mouth at bedtime.  Dispense: 90 capsule; Refill: 4    Discussed SBE, colonoscopy and DEXA screening as directed. Recommend of exercise weekly, including weight bearing exercise. Encouraged the use of seatbelts and sunscreen.  Return in 1 year for annual or sooner prn.  Arlie Solomons B WHNP-BC, 1:43 PM 11/06/2022

## 2022-11-21 ENCOUNTER — Ambulatory Visit: Payer: BC Managed Care – PPO | Admitting: Family Medicine

## 2022-11-21 ENCOUNTER — Other Ambulatory Visit: Payer: Self-pay | Admitting: Radiology

## 2022-11-21 DIAGNOSIS — N951 Menopausal and female climacteric states: Secondary | ICD-10-CM

## 2022-12-08 ENCOUNTER — Telehealth: Payer: Self-pay | Admitting: Internal Medicine

## 2022-12-08 ENCOUNTER — Telehealth: Payer: Self-pay

## 2022-12-08 ENCOUNTER — Other Ambulatory Visit (HOSPITAL_COMMUNITY): Payer: Self-pay

## 2022-12-08 NOTE — Telephone Encounter (Signed)
Question from insurance:   Has the provider submitted documentation (for example: chart notes) confirming diagnosis of narcolepsy as confirmed by sleep study? Failure to submit documentation may lead to an adverse determination.  I see where the patient may have had a sleep study in 2005 but there are no charts that I can find with the diagnosis or findings. Please advise.

## 2022-12-08 NOTE — Telephone Encounter (Signed)
modafinil (PROVIGIL) 100 MG tablet   pt. Needs more refills but the pharmacy is asking for prior authmeds please advise

## 2022-12-08 NOTE — Telephone Encounter (Signed)
*  Pulm  PA request received for Modafinil 100MG  tablets  Pending response from provider/updated sleep study for confirmation of diagnosis of Narcolepsy-last one may have been 2005  Key: [id number]

## 2022-12-11 NOTE — Telephone Encounter (Signed)
PA has expired due to no response from provider, will need to "Renew" if appropriate.

## 2022-12-13 NOTE — Progress Notes (Unsigned)
[facility] [address], [address] [place], [place] [address] [phone]  Date:  12/18/2022   Name:  Miranda [name]   DOB:  Sep 28, 1960   MRN:  [id number]  PCP:  Pearline Cables, MD    Chief Complaint: No chief complaint on file.   History of Present Illness:  Miranda [name] is a 62 y.o. very pleasant female patient who presents with the following:  Patient seen today for physical exam.  Most recent visit with myself was in September of last year when she was ill with low-grade fevers  History of seronegative arthritis, connective tissue disease, difficulty with sleep and hypersomnia, mood disorder, cervical spine disease, palpitations  She has gynecology care-was seen in June  Mammogram is up-to-date Pap smear up-to-date Colon cancer screening up-to-date Can update routine lab work today  Adderall XR 30/10 mg immediate release as needed Lexapro 20 Vivelle-Dot, Prometrium Provigil    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   ALLERGY 05/10/2007    Past Medical History:  Diagnosis Date   Abnormal Pap smear of cervix 1996   Allergy    Allergy, unspecified not elsewhere classified    IBS (irritable bowel syndrome)    Idiopathic hypersomnia    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    Past Surgical History:  Procedure Laterality Date   CRYOTHERAPY     SHOULDER SURGERY     right   TONSILLECTOMY      Social History   Tobacco Use   Smoking status: Never     Passive exposure: Past   Smokeless tobacco: Never  Vaping Use   Vaping status: Never Used  Substance Use Topics   Alcohol use: Not Currently   Drug use: No    Family History  Adopted: Yes  Problem Relation Age of Onset   Cancer Mother        stomach   Heart disease Mother    Heart attack Mother    Heart disease Father    Colon cancer Neg Hx     Allergies  Allergen Reactions   Keflex [Cephalexin] Itching    Medication list has been reviewed and updated.  Current Outpatient Medications on File Prior to Visit  Medication Sig Dispense Refill   amphetamine-dextroamphetamine (ADDERALL XR) 30 MG 24 hr capsule Take 1 capsule (30 mg total) by mouth daily. 31 capsule 0   amphetamine-dextroamphetamine (ADDERALL) 10 MG tablet 1 or 2 daily if needed 60 tablet 0   cyclobenzaprine (FLEXERIL) 10 MG tablet TAKE 1 TABLET BY MOUTH TWICE A DAY AS NEEDED FOR MUSCLE SPASM 30 tablet 0   escitalopram (LEXAPRO) 20 MG tablet TAKE 1 TABLET(20 MG) BY MOUTH DAILY 90 tablet 0   estradiol (VIVELLE-DOT) 0.05 MG/24HR patch Place 1 patch (0.05 mg total) onto the skin 2 (two) times a week. 24 patch 4   Ibuprofen (ADVIL PO) Take 200 mg by mouth as needed (pain).      modafinil (PROVIGIL) 100 MG tablet Take 1 tablet (100 mg total) by mouth daily. 30 tablet  5   progesterone (PROMETRIUM) 100 MG capsule Take 1 capsule (100 mg total) by mouth at bedtime. 90 capsule 4   No current facility-administered medications on file prior to visit.    Review of Systems:  As per HPI- otherwise negative.   Physical Examination: There were no vitals filed for this visit. There were no vitals filed for this visit. There is no height or weight on file to calculate BMI. Ideal Body Weight:    GEN: no acute distress. HEENT: Atraumatic, Normocephalic.  Ears and Nose: No external deformity. CV: RRR, No M/G/R. No JVD. No thrill. No extra heart sounds. PULM: CTA B, no wheezes, crackles, rhonchi. No retractions. No resp.  distress. No accessory muscle use. ABD: S, NT, ND, +BS. No rebound. No HSM. EXTR: No c/c/e PSYCH: Normally interactive. Conversant.    Assessment and Plan: *** Physical exam today.  Encouraged healthy diet and exercise routine  Will plan further follow- up pending labs.  Signed Abbe Amsterdam, MD

## 2022-12-13 NOTE — Patient Instructions (Incomplete)
It was great to see again today, I will request your labs from Dr Langston MaskerMorris I refilled your medication today Take care and best of luck with house hunting!

## 2022-12-14 ENCOUNTER — Other Ambulatory Visit: Payer: Self-pay | Admitting: Internal Medicine

## 2022-12-15 ENCOUNTER — Other Ambulatory Visit: Payer: Self-pay | Admitting: Internal Medicine

## 2022-12-15 ENCOUNTER — Encounter: Payer: Self-pay | Admitting: Internal Medicine

## 2022-12-16 NOTE — Telephone Encounter (Signed)
You can use my 01/30/22 note

## 2022-12-16 NOTE — Telephone Encounter (Signed)
Dr young please advise

## 2022-12-17 NOTE — Telephone Encounter (Signed)
I'm sorry. Modafinil refill was sent 7/22. Was she needing Adderall refill? Or is there still a problem.

## 2022-12-17 NOTE — Telephone Encounter (Signed)
Dr. Maple Hudson, please see patient message regarding need for refill of medication (Adderall).  Thank you.

## 2022-12-18 ENCOUNTER — Encounter: Payer: Self-pay | Admitting: Family Medicine

## 2022-12-18 ENCOUNTER — Ambulatory Visit (INDEPENDENT_AMBULATORY_CARE_PROVIDER_SITE_OTHER): Payer: BC Managed Care – PPO | Admitting: Family Medicine

## 2022-12-18 VITALS — BP 100/60 | HR 79 | Temp 97.6°F | Resp 18 | Ht 64.0 in | Wt 140.0 lb

## 2022-12-18 DIAGNOSIS — E559 Vitamin D deficiency, unspecified: Secondary | ICD-10-CM

## 2022-12-18 DIAGNOSIS — Z1329 Encounter for screening for other suspected endocrine disorder: Secondary | ICD-10-CM | POA: Diagnosis not present

## 2022-12-18 DIAGNOSIS — Z0000 Encounter for general adult medical examination without abnormal findings: Secondary | ICD-10-CM | POA: Diagnosis not present

## 2022-12-18 DIAGNOSIS — Z130 Encounter for screening for diseases of the blood and blood-forming organs and certain disorders involving the immune mechanism: Secondary | ICD-10-CM

## 2022-12-18 DIAGNOSIS — F39 Unspecified mood [affective] disorder: Secondary | ICD-10-CM

## 2022-12-18 DIAGNOSIS — Z131 Encounter for screening for diabetes mellitus: Secondary | ICD-10-CM | POA: Diagnosis not present

## 2022-12-18 DIAGNOSIS — Z13220 Encounter for screening for lipoid disorders: Secondary | ICD-10-CM

## 2022-12-18 LAB — LIPID PANEL
Cholesterol: 188 mg/dL (ref 0–200)
HDL: 59.2 mg/dL (ref >39.00)
LDL Cholesterol: 114 mg/dL — ABNORMAL HIGH (ref 0–99)
NonHDL: 128.71
Total CHOL/HDL Ratio: 3
Triglycerides: 74 mg/dL (ref 0.0–149.0)
VLDL: 14.8 mg/dL (ref 0.0–40.0)

## 2022-12-18 LAB — CBC
HCT: 45.1 % (ref 36.0–46.0)
Hemoglobin: 14.4 g/dL (ref 12.0–15.0)
MCHC: 31.9 g/dL (ref 30.0–36.0)
MCV: 96.1 fl (ref 78.0–100.0)
Platelets: 213 10*3/uL (ref 150.0–400.0)
RBC: 4.69 Mil/uL (ref 3.87–5.11)
RDW: 13.2 % (ref 11.5–15.5)
WBC: 7.6 10*3/uL (ref 4.0–10.5)

## 2022-12-18 LAB — COMPREHENSIVE METABOLIC PANEL
ALT: 15 U/L (ref 0–35)
AST: 20 U/L (ref 0–37)
Albumin: 4.1 g/dL (ref 3.5–5.2)
Alkaline Phosphatase: 99 U/L (ref 39–117)
BUN: 24 mg/dL — ABNORMAL HIGH (ref 6–23)
CO2: 27 mEq/L (ref 19–32)
Calcium: 9.5 mg/dL (ref 8.4–10.5)
Chloride: 102 mEq/L (ref 96–112)
Creatinine, Ser: 0.91 mg/dL (ref 0.40–1.20)
GFR: 67.61 mL/min (ref >60.00)
Glucose, Bld: 91 mg/dL (ref 70–99)
Potassium: 4.8 mEq/L (ref 3.5–5.1)
Sodium: 136 mEq/L (ref 135–145)
Total Bilirubin: 0.8 mg/dL (ref 0.2–1.2)
Total Protein: 6.5 g/dL (ref 6.0–8.3)

## 2022-12-18 LAB — HEMOGLOBIN A1C: Hgb A1c MFr Bld: 5.7 % (ref 4.6–6.5)

## 2022-12-18 LAB — VITAMIN D 25 HYDROXY (VIT D DEFICIENCY, FRACTURES): VITD: 38.89 ng/mL (ref 30.00–100.00)

## 2022-12-18 LAB — TSH: TSH: 1.67 u[IU]/mL (ref 0.35–5.50)

## 2022-12-18 MED ORDER — ESCITALOPRAM OXALATE 20 MG PO TABS
20.0000 mg | ORAL_TABLET | Freq: Every day | ORAL | 3 refills | Status: DC
Start: 2022-12-18 — End: 2024-03-24
  Filled 2023-08-10: qty 90, 90d supply, fill #0
  Filled 2023-09-25 – 2023-12-08 (×3): qty 90, 90d supply, fill #1

## 2022-12-18 MED ORDER — AMPHETAMINE-DEXTROAMPHETAMINE 10 MG PO TABS: ORAL_TABLET | ORAL | 0 refills | Status: DC

## 2022-12-18 MED ORDER — AMPHETAMINE-DEXTROAMPHET ER 30 MG PO CP24: 30.0000 mg | ORAL_CAPSULE | Freq: Every day | ORAL | 0 refills | Status: DC

## 2022-12-18 NOTE — Telephone Encounter (Signed)
Modafinil refilled.

## 2022-12-18 NOTE — Telephone Encounter (Signed)
Adderall refilled

## 2022-12-19 NOTE — Telephone Encounter (Signed)
Pa submitted with additional notation and is pending determination.

## 2022-12-19 NOTE — Telephone Encounter (Signed)
PA renewed and added provider note from 01/30/22 per Dr. Maple Hudson.  [place] Key: [id number]

## 2022-12-24 NOTE — Telephone Encounter (Signed)
Could we f/u to get encounter closed

## 2022-12-25 ENCOUNTER — Other Ambulatory Visit (HOSPITAL_COMMUNITY): Payer: Self-pay

## 2022-12-25 NOTE — Telephone Encounter (Signed)
PA has been APPROVED.

## 2022-12-25 NOTE — Telephone Encounter (Signed)
Pharmacy Patient Advocate Encounter  Received notification from [facility] that Prior Authorization for Modafinil 100MG  tablets  has been APPROVED from 12/19/2022 to 12/19/2023. Ran test claim, Copay is $10.00  PA #/Case ID/Reference #: [id number]

## 2022-12-25 NOTE — Telephone Encounter (Signed)
Called pt. Back and let her know Prior Auth was approved

## 2023-01-15 ENCOUNTER — Other Ambulatory Visit: Payer: Self-pay | Admitting: Medical Genetics

## 2023-01-15 DIAGNOSIS — Z006 Encounter for examination for normal comparison and control in clinical research program: Secondary | ICD-10-CM

## 2023-02-19 ENCOUNTER — Other Ambulatory Visit: Payer: Self-pay | Admitting: Internal Medicine

## 2023-02-21 MED ORDER — AMPHETAMINE-DEXTROAMPHET ER 30 MG PO CP24: 30.0000 mg | ORAL_CAPSULE | Freq: Every day | ORAL | 0 refills | Status: DC

## 2023-02-21 MED ORDER — AMPHETAMINE-DEXTROAMPHETAMINE 10 MG PO TABS: ORAL_TABLET | ORAL | 0 refills | Status: DC

## 2023-02-21 NOTE — Telephone Encounter (Signed)
Adderall scripts refilled 

## 2023-03-10 DIAGNOSIS — L818 Other specified disorders of pigmentation: Secondary | ICD-10-CM | POA: Diagnosis not present

## 2023-03-10 DIAGNOSIS — D225 Melanocytic nevi of trunk: Secondary | ICD-10-CM | POA: Diagnosis not present

## 2023-03-10 DIAGNOSIS — L814 Other melanin hyperpigmentation: Secondary | ICD-10-CM | POA: Diagnosis not present

## 2023-03-10 DIAGNOSIS — L821 Other seborrheic keratosis: Secondary | ICD-10-CM | POA: Diagnosis not present

## 2023-03-10 DIAGNOSIS — L570 Actinic keratosis: Secondary | ICD-10-CM | POA: Diagnosis not present

## 2023-03-10 DIAGNOSIS — L02821 Furuncle of head [any part, except face]: Secondary | ICD-10-CM | POA: Diagnosis not present

## 2023-03-10 DIAGNOSIS — L309 Dermatitis, unspecified: Secondary | ICD-10-CM | POA: Diagnosis not present

## 2023-03-10 DIAGNOSIS — L98429 Non-pressure chronic ulcer of back with unspecified severity: Secondary | ICD-10-CM | POA: Diagnosis not present

## 2023-03-13 ENCOUNTER — Telehealth: Payer: Self-pay | Admitting: Internal Medicine

## 2023-03-13 MED ORDER — AMPHETAMINE-DEXTROAMPHET ER 30 MG PO CP24: 30.0000 mg | ORAL_CAPSULE | Freq: Every day | ORAL | 0 refills | Status: DC

## 2023-03-13 NOTE — Telephone Encounter (Signed)
[facility]

## 2023-04-09 ENCOUNTER — Other Ambulatory Visit (HOSPITAL_COMMUNITY): Payer: Self-pay

## 2023-04-09 ENCOUNTER — Other Ambulatory Visit: Payer: Self-pay | Admitting: Internal Medicine

## 2023-04-09 MED ORDER — AMPHETAMINE-DEXTROAMPHET ER 30 MG PO CP24
30.0000 mg | ORAL_CAPSULE | Freq: Every day | ORAL | 0 refills | Status: DC
Start: 2023-04-09 — End: 2023-05-27
  Filled 2023-04-09 – 2023-04-14 (×2): qty 31, 31d supply, fill #0

## 2023-04-09 NOTE — Telephone Encounter (Signed)
Adderall refilled

## 2023-04-14 ENCOUNTER — Other Ambulatory Visit (HOSPITAL_COMMUNITY): Payer: Self-pay

## 2023-05-27 ENCOUNTER — Other Ambulatory Visit: Payer: Self-pay | Admitting: Internal Medicine

## 2023-05-28 ENCOUNTER — Other Ambulatory Visit (HOSPITAL_COMMUNITY)
Admission: RE | Admit: 2023-05-28 | Discharge: 2023-05-28 | Disposition: A | Discharge status: Home / Self Care | Payer: BC Managed Care – PPO | Source: Ambulatory Visit | Attending: Oncology | Admitting: Oncology

## 2023-05-28 DIAGNOSIS — Z006 Encounter for examination for normal comparison and control in clinical research program: Secondary | ICD-10-CM | POA: Insufficient documentation

## 2023-05-29 ENCOUNTER — Other Ambulatory Visit (HOSPITAL_COMMUNITY): Payer: Self-pay

## 2023-05-29 MED ORDER — AMPHETAMINE-DEXTROAMPHET ER 30 MG PO CP24
30.0000 mg | ORAL_CAPSULE | Freq: Every day | ORAL | 0 refills | Status: DC
Start: 2023-05-29 — End: 2023-07-13
  Filled 2023-05-29: qty 31, 31d supply, fill #0

## 2023-05-29 NOTE — Telephone Encounter (Signed)
 Adderall refilled

## 2023-06-01 ENCOUNTER — Other Ambulatory Visit (HOSPITAL_COMMUNITY): Payer: Self-pay

## 2023-06-02 ENCOUNTER — Telehealth: Payer: Self-pay

## 2023-06-02 ENCOUNTER — Other Ambulatory Visit (HOSPITAL_COMMUNITY): Payer: Self-pay

## 2023-06-02 NOTE — Telephone Encounter (Signed)
 Pharmacy Patient Advocate Encounter   Received notification from CoverMyMeds that prior authorization for Modafinil  100MG  tablets is required/requested.   Insurance verification completed.   The patient is insured through [facility] .   Per test claim: PA required; PA submitted to above mentioned insurance via CoverMyMeds Key/confirmation #/EOC [id number] Status is pending

## 2023-06-04 NOTE — Telephone Encounter (Signed)
 Pharmacy Patient Advocate Encounter  Received notification from [facility] that Prior Authorization for Modafinil 100MG  tablets has been APPROVED from 06-02-2023 to 06-01-2024   PA #/Case ID/Reference #: [id number]

## 2023-06-04 NOTE — Telephone Encounter (Signed)
 Pt is requesting Adderall refills. It states this was just signed on 05-29-23. Please advise.

## 2023-06-07 NOTE — Telephone Encounter (Signed)
 Please clarify which of her 2 dosage strengths of adderall she was needing refilled.  I think we refilled adderall 10 mg on January 3, so if that's what is requested, this is a duplicate.

## 2023-06-08 LAB — GENECONNECT MOLECULAR SCREEN: Genetic Analysis Overall Interpretation: NEGATIVE

## 2023-06-11 ENCOUNTER — Other Ambulatory Visit: Payer: Self-pay | Admitting: Internal Medicine

## 2023-06-15 ENCOUNTER — Other Ambulatory Visit (HOSPITAL_COMMUNITY): Payer: Self-pay

## 2023-06-15 MED ORDER — AMPHETAMINE-DEXTROAMPHETAMINE 10 MG PO TABS
10.0000 mg | ORAL_TABLET | Freq: Every day | ORAL | 0 refills | Status: DC | PRN
Start: 2023-06-15 — End: 2023-07-29
  Filled 2023-06-15: qty 60, 30d supply, fill #0

## 2023-06-15 NOTE — Telephone Encounter (Signed)
Adderall refilled at [facility]

## 2023-06-18 ENCOUNTER — Other Ambulatory Visit (HOSPITAL_COMMUNITY): Payer: Self-pay

## 2023-07-09 NOTE — Progress Notes (Deleted)
 [facility] [address], [address] [place], [place] [address] [phone]  Date:  07/13/2023   Name:  Miranda [name]   DOB:  Jan 29, 1961   MRN:  [id number]  PCP:  Pearline Cables, MD    Chief Complaint: No chief complaint on file.   History of Present Illness:  Miranda [name] is a 63 y.o. very pleasant female patient who presents with the following:  Patient seen today with concern of feeling very tired-she does have a history of hypersomnia which is treated by her pulmonologist Most recent visit with myself was in July for her physical  History of seronegative arthritis, connective tissue disease, difficulty with sleep and hypersomnia, mood disorder, cervical spine disease, palpitations   Flu vaccine Recommend COVID booster Mammogram, colonoscopy, Pap up-to-date Shingrix is complete  Adderall XR 30 a.m., 10 PM-per her pulmonologist Provigil Lexapro 20 Vivelle-Dot, Prometrium at bedtime   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   ALLERGY 05/10/2007    Past Medical History:  Diagnosis Date   Abnormal Pap smear of cervix 1996   Allergy    Allergy, unspecified not elsewhere classified    IBS (irritable bowel syndrome)    Idiopathic hypersomnia    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    Past Surgical History:  Procedure Laterality Date   CRYOTHERAPY     SHOULDER SURGERY     right   TONSILLECTOMY      Social History   Tobacco Use   Smoking status:  Never    Passive exposure: Past   Smokeless tobacco: Never  Vaping Use   Vaping status: Never Used  Substance Use Topics   Alcohol use: Not Currently   Drug use: No    Family History  Adopted: Yes  Problem Relation Age of Onset   Cancer Mother        stomach   Heart disease Mother    Heart attack Mother    Heart disease Father    Colon cancer Neg Hx     Allergies  Allergen Reactions   Keflex [Cephalexin] Itching    Medication list has been reviewed and updated.  Current Outpatient Medications on File Prior to Visit  Medication Sig Dispense Refill   amphetamine-dextroamphetamine (ADDERALL XR) 30 MG 24 hr capsule Take 1 capsule (30 mg total) by mouth daily. 31 capsule 0   amphetamine-dextroamphetamine (ADDERALL) 10 MG tablet Take 1-2 tablets (10-20 mg total) by mouth daily as needed. 60 tablet 0   cyclobenzaprine (FLEXERIL) 10 MG tablet TAKE 1 TABLET BY MOUTH TWICE A DAY AS NEEDED FOR MUSCLE SPASM 30 tablet 0   escitalopram (LEXAPRO) 20 MG tablet TAKE 1 TABLET(20 MG) BY MOUTH DAILY. 90 tablet 3   estradiol (VIVELLE-DOT) 0.05 MG/24HR patch Place 1 patch (0.05 mg total) onto the skin 2 (two) times a week. 24 patch 4   Ibuprofen (ADVIL PO) Take 200 mg by mouth as needed (pain).      modafinil (PROVIGIL) 100 MG tablet TAKE 1 TABLET(100 MG) BY MOUTH DAILY 30  tablet 5   progesterone (PROMETRIUM) 100 MG capsule Take 1 capsule (100 mg total) by mouth at bedtime. 90 capsule 4   No current facility-administered medications on file prior to visit.    Review of Systems:  As per HPI- otherwise negative.   Physical Examination: There were no vitals filed for this visit. There were no vitals filed for this visit. There is no height or weight on file to calculate BMI. Ideal Body Weight:    GEN: no acute distress. HEENT: Atraumatic, Normocephalic.  Ears and Nose: No external deformity. CV: RRR, No M/G/R. No JVD. No thrill. No extra heart sounds. PULM: CTA B, no wheezes,  crackles, rhonchi. No retractions. No resp. distress. No accessory muscle use. ABD: S, NT, ND, +BS. No rebound. No HSM. EXTR: No c/c/e PSYCH: Normally interactive. Conversant.    Assessment and Plan: ***  Signed Abbe Amsterdam, MD

## 2023-07-13 ENCOUNTER — Other Ambulatory Visit: Payer: Self-pay | Admitting: Internal Medicine

## 2023-07-13 ENCOUNTER — Ambulatory Visit: Payer: BC Managed Care – PPO | Admitting: Family Medicine

## 2023-07-13 ENCOUNTER — Other Ambulatory Visit (HOSPITAL_COMMUNITY): Payer: Self-pay

## 2023-07-13 MED ORDER — AMPHETAMINE-DEXTROAMPHET ER 30 MG PO CP24
30.0000 mg | ORAL_CAPSULE | Freq: Every day | ORAL | 0 refills | Status: DC
  Filled 2023-07-13: qty 31, 31d supply, fill #0

## 2023-07-13 MED ORDER — MODAFINIL 100 MG PO TABS
100.0000 mg | ORAL_TABLET | Freq: Every day | ORAL | 5 refills | Status: DC
  Filled 2023-07-13: qty 30, 30d supply, fill #0
  Filled 2023-08-17: qty 30, 30d supply, fill #1
  Filled 2023-09-25: qty 30, 30d supply, fill #2
  Filled 2023-11-02: qty 30, 30d supply, fill #3
  Filled 2023-12-08: qty 30, 30d supply, fill #4

## 2023-07-13 NOTE — Telephone Encounter (Signed)
 Adderall refilled

## 2023-07-14 ENCOUNTER — Other Ambulatory Visit (HOSPITAL_COMMUNITY): Payer: Self-pay

## 2023-07-14 ENCOUNTER — Other Ambulatory Visit: Payer: Self-pay

## 2023-07-29 ENCOUNTER — Other Ambulatory Visit: Payer: Self-pay | Admitting: Internal Medicine

## 2023-07-30 ENCOUNTER — Other Ambulatory Visit (HOSPITAL_COMMUNITY): Payer: Self-pay

## 2023-07-30 MED ORDER — AMPHETAMINE-DEXTROAMPHETAMINE 10 MG PO TABS
10.0000 mg | ORAL_TABLET | Freq: Every day | ORAL | 0 refills | Status: DC | PRN
  Filled 2023-07-30 (×2): qty 60, 30d supply, fill #0

## 2023-07-30 NOTE — Telephone Encounter (Signed)
 Adderall refilled

## 2023-07-30 NOTE — Telephone Encounter (Signed)
 Pt is requesting medication refill for adderall. Pt was last seen 01/30/2022, please advise Dr. Maple Hudson

## 2023-08-03 ENCOUNTER — Encounter: Payer: Self-pay | Admitting: Family Medicine

## 2023-08-04 NOTE — Patient Instructions (Incomplete)
 It was very nice to see you again today- I will be in touch with your labs and chest x-ray asap  Please let me know if anything is changing or getting worse  I will refer you to see Dr Jacques Navy to look at your heart in more detail Please call Dr Maple Hudson and make an appt to be seen!

## 2023-08-04 NOTE — Progress Notes (Unsigned)
 [facility] [address], [address] [place], [place] [address] [phone]  Date:  08/05/2023   Name:  DIMITRI DSOUZA   DOB:  [birth year]   MRN:  [id number]  PCP:  Pearline Cables, MD    Chief Complaint: No chief complaint on file.   History of Present Illness:  Miranda [name] is a 63 y.o. very pleasant female patient who presents with the following:  Patient seen today with concern of fatigue.  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   ALLERGY 05/10/2007    Past Medical History:  Diagnosis Date   Abnormal Pap smear of cervix 1996   Allergy    Allergy, unspecified not elsewhere classified    IBS (irritable bowel syndrome)    Idiopathic hypersomnia    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    Past Surgical History:  Procedure Laterality Date   CRYOTHERAPY     SHOULDER SURGERY     right   TONSILLECTOMY      Social History   Tobacco Use   Smoking status: Never    Passive exposure: Past   Smokeless tobacco: Never  Vaping Use   Vaping status: Never Used  Substance Use Topics   Alcohol use: Not Currently   Drug use: No    Family History   Adopted: Yes  Problem Relation Age of Onset   Cancer Mother        stomach   Heart disease Mother    Heart attack Mother    Heart disease Father    Colon cancer Neg Hx     Allergies  Allergen Reactions   Keflex [Cephalexin] Itching    Medication list has been reviewed and updated.  Current Outpatient Medications on File Prior to Visit  Medication Sig Dispense Refill   amphetamine-dextroamphetamine (ADDERALL XR) 30 MG 24 hr capsule Take 1 capsule (30 mg total) by mouth daily. 31 capsule 0   amphetamine-dextroamphetamine (ADDERALL) 10 MG tablet Take 1-2 tablets (10-20 mg total) by mouth daily as needed. 60 tablet 0   cyclobenzaprine (FLEXERIL) 10 MG tablet TAKE 1 TABLET BY MOUTH TWICE A DAY AS NEEDED FOR MUSCLE SPASM 30 tablet 0   escitalopram (LEXAPRO) 20 MG tablet TAKE 1 TABLET(20 MG) BY MOUTH DAILY. 90 tablet 3   estradiol (VIVELLE-DOT) 0.05 MG/24HR patch Place 1 patch (0.05 mg total) onto the skin 2 (two) times a week. 24 patch 4   Ibuprofen (ADVIL PO) Take 200 mg by mouth as needed (pain).      modafinil (PROVIGIL) 100 MG tablet Take 1 tablet (100 mg total) by mouth daily. 30 tablet 5   progesterone (PROMETRIUM) 100 MG capsule Take 1 capsule (100 mg total) by mouth at bedtime. 90 capsule 4   No  current facility-administered medications on file prior to visit.    Review of Systems:  As per HPI- otherwise negative.   Physical Examination: There were no vitals filed for this visit. There were no vitals filed for this visit. There is no height or weight on file to calculate BMI. Ideal Body Weight:    GEN: no acute distress. HEENT: Atraumatic, Normocephalic.  Ears and Nose: No external deformity. CV: RRR, No M/G/R. No JVD. No thrill. No extra heart sounds. PULM: CTA B, no wheezes, crackles, rhonchi. No retractions. No resp. distress. No accessory muscle use. ABD: S, NT, ND, +BS. No rebound. No HSM. EXTR: No c/c/e PSYCH: Normally interactive. Conversant.     Assessment and Plan: ***  Signed Abbe Amsterdam, MD

## 2023-08-05 ENCOUNTER — Ambulatory Visit: Admitting: Family Medicine

## 2023-08-05 ENCOUNTER — Encounter: Payer: Self-pay | Admitting: Family Medicine

## 2023-08-05 ENCOUNTER — Ambulatory Visit (HOSPITAL_BASED_OUTPATIENT_CLINIC_OR_DEPARTMENT_OTHER)
Admission: RE | Admit: 2023-08-05 | Discharge: 2023-08-05 | Disposition: A | Discharge status: Home / Self Care | Source: Ambulatory Visit | Attending: Family Medicine | Admitting: Family Medicine

## 2023-08-05 VITALS — BP 110/72 | HR 68 | Temp 98.2°F | Resp 18 | Ht 64.0 in | Wt 144.4 lb

## 2023-08-05 DIAGNOSIS — Z130 Encounter for screening for diseases of the blood and blood-forming organs and certain disorders involving the immune mechanism: Secondary | ICD-10-CM

## 2023-08-05 DIAGNOSIS — R0602 Shortness of breath: Secondary | ICD-10-CM | POA: Diagnosis not present

## 2023-08-05 DIAGNOSIS — R5383 Other fatigue: Secondary | ICD-10-CM

## 2023-08-05 LAB — COMPREHENSIVE METABOLIC PANEL
ALT: 14 U/L (ref 0–35)
AST: 16 U/L (ref 0–37)
Albumin: 4.1 g/dL (ref 3.5–5.2)
Alkaline Phosphatase: 82 U/L (ref 39–117)
BUN: 23 mg/dL (ref 6–23)
CO2: 32 meq/L (ref 19–32)
Calcium: 9.3 mg/dL (ref 8.4–10.5)
Chloride: 100 meq/L (ref 96–112)
Creatinine, Ser: 1.04 mg/dL (ref 0.40–1.20)
GFR: 57.35 mL/min — ABNORMAL LOW (ref >60.00)
Glucose, Bld: 92 mg/dL (ref 70–99)
Potassium: 4.3 meq/L (ref 3.5–5.1)
Sodium: 137 meq/L (ref 135–145)
Total Bilirubin: 0.6 mg/dL (ref 0.2–1.2)
Total Protein: 6.6 g/dL (ref 6.0–8.3)

## 2023-08-05 LAB — FERRITIN: Ferritin: 72.9 ng/mL (ref 10.0–291.0)

## 2023-08-05 LAB — TROPONIN I (HIGH SENSITIVITY): High Sens Troponin I: 3 ng/L (ref 2–17)

## 2023-08-05 LAB — CBC
HCT: 47.1 % — ABNORMAL HIGH (ref 36.0–46.0)
Hemoglobin: 15.6 g/dL — ABNORMAL HIGH (ref 12.0–15.0)
MCHC: 33.2 g/dL (ref 30.0–36.0)
MCV: 95.9 fl (ref 78.0–100.0)
Platelets: 226 10*3/uL (ref 150.0–400.0)
RBC: 4.92 Mil/uL (ref 3.87–5.11)
RDW: 13.1 % (ref 11.5–15.5)
WBC: 8.7 10*3/uL (ref 4.0–10.5)

## 2023-08-05 LAB — VITAMIN D 25 HYDROXY (VIT D DEFICIENCY, FRACTURES): VITD: 24.6 ng/mL — ABNORMAL LOW (ref 30.00–100.00)

## 2023-08-05 LAB — TSH: TSH: 2.1 u[IU]/mL (ref 0.35–5.50)

## 2023-08-05 LAB — VITAMIN B12: Vitamin B-12: 303 pg/mL (ref 211–911)

## 2023-08-05 LAB — D-DIMER, QUANTITATIVE: D-Dimer, Quant: 0.22 ug{FEU}/mL (ref <0.50)

## 2023-08-10 ENCOUNTER — Other Ambulatory Visit (HOSPITAL_COMMUNITY): Payer: Self-pay

## 2023-08-11 ENCOUNTER — Other Ambulatory Visit (HOSPITAL_COMMUNITY): Payer: Self-pay

## 2023-08-12 ENCOUNTER — Encounter (HOSPITAL_COMMUNITY): Payer: Self-pay | Admitting: Emergency Medicine

## 2023-08-12 ENCOUNTER — Emergency Department (HOSPITAL_COMMUNITY)

## 2023-08-12 ENCOUNTER — Other Ambulatory Visit: Payer: Self-pay

## 2023-08-12 ENCOUNTER — Emergency Department (HOSPITAL_COMMUNITY)
Admission: EM | Admit: 2023-08-12 | Discharge: 2023-08-12 | Disposition: A | Discharge status: Home / Self Care | Attending: Emergency Medicine | Admitting: Emergency Medicine

## 2023-08-12 DIAGNOSIS — U071 COVID-19: Secondary | ICD-10-CM | POA: Diagnosis not present

## 2023-08-12 DIAGNOSIS — Z79899 Other long term (current) drug therapy: Secondary | ICD-10-CM | POA: Diagnosis not present

## 2023-08-12 DIAGNOSIS — R0789 Other chest pain: Secondary | ICD-10-CM | POA: Diagnosis not present

## 2023-08-12 DIAGNOSIS — R000 Tachycardia, unspecified: Secondary | ICD-10-CM | POA: Diagnosis not present

## 2023-08-12 LAB — SARS CORONAVIRUS 2 BY RT PCR: SARS Coronavirus 2 by RT PCR: POSITIVE — AB

## 2023-08-12 MED ORDER — PAXLOVID (150/100) 10 X 150 MG & 10 X 100MG PO TBPK: 2.0000 | ORAL_TABLET | Freq: Two times a day (BID) | ORAL | 0 refills | Status: AC

## 2023-08-12 MED ORDER — PAXLOVID (150/100) 10 X 150 MG & 10 X 100MG PO TBPK: 2.0000 | ORAL_TABLET | Freq: Two times a day (BID) | ORAL | 0 refills | Status: DC

## 2023-08-12 NOTE — Discharge Instructions (Signed)
 You have tested positive for Covid-19.  You have been prescribed Paxlovid which is an antiviral, please take this as prescribed.  Please wear a mask and quarantine. You may discontinue when: - It has been at least 5 days have passed since symptoms 1st appeared. - AND have had resolution of fever without the use of fever-reducing medications for 24 hours  Your illness is contagious and can be spread to others, especially during the first 5 days. It cannot be cured by antibiotics or other medicines. Take basic precautions such as washing your hands often, covering your mouth when you cough or sneeze, and avoiding public places where you could spread your illness to others.   Home care instructions:  You can take Tylenol and/or Ibuprofen as directed on the packaging for fever reduction and pain relief.    For cough: honey 1/2 to 1 teaspoon (you can dilute the honey in water or another fluid).  You can also use guaifenesin and dextromethorphan for cough which are over-the-counter medications. You can use a humidifier for chest congestion and cough.  If you don't have a humidifier, you can sit in the bathroom with the hot shower running.      For sore throat: try warm salt water gargles, cepacol lozenges, throat spray, warm tea or water with lemon/honey, popsicles or ice, or OTC cold relief medicine for throat discomfort.    For congestion: Flonase 1-2 sprays in each nostril daily.    It is important to stay hydrated: drink plenty of fluids (water, gatorade/powerade/pedialyte, juices, or teas) to keep your throat moisturized and help further relieve irritation/discomfort.    Follow-up instructions: Please follow-up with your primary care provider for further evaluation of your symptoms if you are not feeling better within the next 5 days.   Return instructions:  Please return to the Emergency Department if you experience worsening symptoms.  RETURN IMMEDIATELY IF you develop shortness of breath,  confusion or altered mental status, a new rash, become dizzy, faint, or poorly responsive, or are unable to be cared for at home. Please return if you have persistent vomiting and cannot keep down fluids or develop a fever that is not controlled by tylenol or motrin.   Please return if you have any other emergent concerns.

## 2023-08-12 NOTE — ED Provider Notes (Signed)
 [place] EMERGENCY DEPARTMENT AT [facility] Provider Note   CSN: [id number] Arrival date & time: 08/12/23  1526     History  No chief complaint on file.   Miranda [name] is a 63 y.o. female with history of IBS, RA, presents with concern for a positive home COVID test this morning and some shortness of breath. Also reports subjective fever and chills.  Reports she has been able to keep food and drink down at home, no abdominal pain or vomiting. Denies chest pain.  HPI     Home Medications Prior to Admission medications   Medication Sig Start Date End Date Taking? Authorizing Provider  amphetamine-dextroamphetamine (ADDERALL XR) 30 MG 24 hr capsule Take 1 capsule (30 mg total) by mouth daily. 07/13/23   Jetty Duhamel D, MD  amphetamine-dextroamphetamine (ADDERALL) 10 MG tablet Take 1-2 tablets (10-20 mg total) by mouth daily as needed. 07/30/23   Jetty Duhamel D, MD  cyclobenzaprine (FLEXERIL) 10 MG tablet TAKE 1 TABLET BY MOUTH TWICE A DAY AS NEEDED FOR MUSCLE SPASM 07/17/22   Copland, Gwenlyn Found, MD  escitalopram (LEXAPRO) 20 MG tablet Take 1 tablet (20 mg total) by mouth daily. 12/18/22   Copland, Gwenlyn Found, MD  estradiol (VIVELLE-DOT) 0.05 MG/24HR patch Place 1 patch (0.05 mg total) onto the skin 2 (two) times a week. 11/06/22   Chrzanowski, Jami B, NP  Ibuprofen (ADVIL PO) Take 200 mg by mouth as needed (pain).     [provider]  modafinil (PROVIGIL) 100 MG tablet Take 1 tablet (100 mg total) by mouth daily. 07/13/23   Waymon Budge, MD  nirmatrelvir/ritonavir, renal dosing, (PAXLOVID, 150/100,) 10 x 150 MG & 10 x 100MG  TBPK Take 2 tablets by mouth 2 (two) times daily for 5 days. Dosage for moderate renal impairment (eGFR >/= 30 to <60 mL/min): 150 mg nirmatrelvir (one 150 mg tablet) with 100 mg ritonavir (one 100 mg tablet), with both tablets taken together twice daily for 5 days. Not recommended if eGFR < 30 mL/min.  PAXLOVID is not recommend in  patients with severe hepatic impairment (Child-Pugh Class C). 08/12/23 08/17/23  Arabella Merles, PA-C  progesterone (PROMETRIUM) 100 MG capsule Take 1 capsule (100 mg total) by mouth at bedtime. 11/06/22   Chrzanowski, Lamona Curl, NP      Allergies    Keflex [cephalexin]    Review of Systems   Review of Systems  Constitutional:  Positive for chills and fever.  Respiratory:  Positive for shortness of breath.     Physical Exam Updated Vital Signs BP (!) 120/90   Pulse 88   Temp 99.1 F (37.3 C) (Oral)   Resp 18   Ht 5\' 4"  (1.626 m)   Wt 63.5 kg   LMP 09/06/2012   SpO2 100%   BMI 24.03 kg/m  Physical Exam Vitals and nursing note reviewed.  Constitutional:      General: She is not in acute distress.    Appearance: She is well-developed.  HENT:     Head: Normocephalic and atraumatic.  Eyes:     Conjunctiva/sclera: Conjunctivae normal.  Cardiovascular:     Rate and Rhythm: Normal rate and regular rhythm.     Heart sounds: No murmur heard. Pulmonary:     Effort: Pulmonary effort is normal. No respiratory distress.     Breath sounds: Normal breath sounds.     Comments: Lungs clear to auscultation bilaterally, talking in full sentences Abdominal:     Palpations: Abdomen is soft.  Tenderness: There is no abdominal tenderness.  Musculoskeletal:        General: No swelling.     Cervical back: Neck supple.  Lymphadenopathy:     Cervical: No cervical adenopathy.  Skin:    General: Skin is warm and dry.     Capillary Refill: Capillary refill takes less than 2 seconds.  Neurological:     Mental Status: She is alert.  Psychiatric:        Mood and Affect: Mood normal.     ED Results / Procedures / Treatments   Labs (all labs ordered are listed, but only abnormal results are displayed) Labs Reviewed  SARS CORONAVIRUS 2 BY RT PCR    EKG None  Radiology DG Chest 2 View Result Date: 08/12/2023 CLINICAL DATA:  Chest tightness. EXAM: CHEST - 2 VIEW COMPARISON:  Chest  radiograph dated 08/05/2023. FINDINGS: No focal consolidation, pleural drain, or pneumothorax. The cardiac silhouette is within normal limits. No acute osseous pathology. IMPRESSION: No active cardiopulmonary disease. Electronically Signed   By: Elgie Collard M.D.   On: 08/12/2023 17:08    Procedures Procedures    Medications Ordered in ED Medications - No data to display  ED Course/ Medical Decision Making/ A&P                                 Medical Decision Making Amount and/or Complexity of Data Reviewed Radiology: ordered.     Differential diagnosis includes but is not limited to COVID, flu, RSV, viral URI, strep pharyngitis, viral pharyngitis, allergic rhinitis, pneumonia, bronchitis   ED Course:  Patient is well-appearing, stable vital signs.  She is satting 97% on room air.  Talking in full sentences.  Lungs clear to auscultation bilaterally.  Chest x-ray without any abnormalities, no concern for pneumonia.  She reports home COVID test was positive, pending results of COVID test here today. Her symptoms of cough, shortness of breath, subjective fever and chills are consistent with COVID, suspect this is the source of patient's symptoms.  Given stable vitals, able to tolerate p.o., and no abnormalities on chest x-ray, feel she can manage her COVID at home with close PCP follow-up.  And a shared decision making conversation with the patient regarding starting Paxlovid.  She would like to start this medication, this was sent into her pharmacy.  Stable and appropriate for discharge home at this time  Impression: Covid-19  Disposition:  The patient was discharged home with instructions to take Paxlovid as prescribed.  Over-the-counter medications as needed for symptom control.  Follow-up with PCP if symptoms not improving within the next 5 days. Return precautions given.  Imaging Studies ordered: I ordered imaging studies including chest x-ray I independently visualized  the imaging with scope of interpretation limited to determining acute life threatening conditions related to emergency care. Imaging showed no acute abnormalities I agree with the radiologist interpretation   Cardiac Monitoring: / EKG: The patient was maintained on a cardiac monitor.  I personally viewed and interpreted the cardiac monitored which showed an underlying rhythm of: Sinus tachycardia              Final Clinical Impression(s) / ED Diagnoses Final diagnoses:  COVID-19    Rx / DC Orders ED Discharge Orders          Ordered    nirmatrelvir/ritonavir, renal dosing, (PAXLOVID, 150/100,) 10 x 150 MG & 10 x 100MG  TBPK  2 times  daily,   Status:  Discontinued        08/12/23 1808    nirmatrelvir/ritonavir, renal dosing, (PAXLOVID, 150/100,) 10 x 150 MG & 10 x 100MG  TBPK  2 times daily        08/12/23 1809              Arabella Merles, PA-C 08/12/23 1831    Charlynne Pander, MD 08/12/23 [phone]

## 2023-08-12 NOTE — ED Triage Notes (Signed)
 Pt comes in pov positive test for covid today.  States chest feels tight.

## 2023-08-17 ENCOUNTER — Other Ambulatory Visit: Payer: Self-pay

## 2023-08-17 ENCOUNTER — Other Ambulatory Visit (HOSPITAL_COMMUNITY): Payer: Self-pay

## 2023-09-24 DIAGNOSIS — F419 Anxiety disorder, unspecified: Secondary | ICD-10-CM | POA: Diagnosis not present

## 2023-09-25 ENCOUNTER — Other Ambulatory Visit (HOSPITAL_COMMUNITY): Payer: Self-pay

## 2023-09-25 ENCOUNTER — Other Ambulatory Visit: Payer: Self-pay | Admitting: Internal Medicine

## 2023-09-25 ENCOUNTER — Other Ambulatory Visit: Payer: Self-pay

## 2023-09-25 MED ORDER — AMPHETAMINE-DEXTROAMPHET ER 30 MG PO CP24
30.0000 mg | ORAL_CAPSULE | Freq: Every day | ORAL | 0 refills | Status: DC
  Filled 2023-09-25: qty 31, 31d supply, fill #0

## 2023-09-25 MED ORDER — AMPHETAMINE-DEXTROAMPHETAMINE 10 MG PO TABS
10.0000 mg | ORAL_TABLET | Freq: Every day | ORAL | 0 refills | Status: DC | PRN
Start: 2023-09-25 — End: 2023-11-02
  Filled 2023-09-25: qty 60, 30d supply, fill #0

## 2023-09-25 NOTE — Telephone Encounter (Signed)
Adderall scripts refilled 

## 2023-09-25 NOTE — Telephone Encounter (Signed)
**Note De-identified  [place] Obfuscation** Please advise 

## 2023-10-06 ENCOUNTER — Other Ambulatory Visit (HOSPITAL_COMMUNITY): Payer: Self-pay

## 2023-10-08 ENCOUNTER — Other Ambulatory Visit (HOSPITAL_COMMUNITY): Payer: Self-pay

## 2023-10-21 DIAGNOSIS — F419 Anxiety disorder, unspecified: Secondary | ICD-10-CM | POA: Diagnosis not present

## 2023-11-02 ENCOUNTER — Other Ambulatory Visit: Payer: Self-pay | Admitting: Internal Medicine

## 2023-11-02 ENCOUNTER — Other Ambulatory Visit (HOSPITAL_COMMUNITY): Payer: Self-pay

## 2023-11-02 ENCOUNTER — Other Ambulatory Visit: Payer: Self-pay

## 2023-11-02 NOTE — Telephone Encounter (Signed)
**Note De-identified  [place] Obfuscation** Please advise 

## 2023-11-03 ENCOUNTER — Other Ambulatory Visit (HOSPITAL_COMMUNITY): Payer: Self-pay

## 2023-11-03 MED ORDER — AMPHETAMINE-DEXTROAMPHET ER 30 MG PO CP24
30.0000 mg | ORAL_CAPSULE | Freq: Every day | ORAL | 0 refills | Status: DC
  Filled 2023-11-03: qty 31, 31d supply, fill #0

## 2023-11-03 MED ORDER — AMPHETAMINE-DEXTROAMPHETAMINE 10 MG PO TABS
10.0000 mg | ORAL_TABLET | Freq: Every day | ORAL | 0 refills | Status: DC | PRN
  Filled 2023-11-03: qty 60, 30d supply, fill #0

## 2023-11-03 NOTE — Telephone Encounter (Signed)
Adderall scripts refilled 

## 2023-11-04 DIAGNOSIS — F419 Anxiety disorder, unspecified: Secondary | ICD-10-CM | POA: Diagnosis not present

## 2023-11-17 ENCOUNTER — Encounter: Payer: Self-pay | Admitting: Radiology

## 2023-11-17 ENCOUNTER — Ambulatory Visit (INDEPENDENT_AMBULATORY_CARE_PROVIDER_SITE_OTHER): Payer: Self-pay | Admitting: Radiology

## 2023-11-17 ENCOUNTER — Other Ambulatory Visit (HOSPITAL_COMMUNITY): Payer: Self-pay

## 2023-11-17 VITALS — BP 108/72 | HR 90 | Ht 63.25 in | Wt 147.0 lb

## 2023-11-17 DIAGNOSIS — Z01419 Encounter for gynecological examination (general) (routine) without abnormal findings: Secondary | ICD-10-CM | POA: Diagnosis not present

## 2023-11-17 DIAGNOSIS — Z79890 Hormone replacement therapy (postmenopausal): Secondary | ICD-10-CM | POA: Diagnosis not present

## 2023-11-17 DIAGNOSIS — Z1331 Encounter for screening for depression: Secondary | ICD-10-CM

## 2023-11-17 DIAGNOSIS — E2839 Other primary ovarian failure: Secondary | ICD-10-CM

## 2023-11-17 DIAGNOSIS — N951 Menopausal and female climacteric states: Secondary | ICD-10-CM | POA: Diagnosis not present

## 2023-11-17 MED ORDER — PROGESTERONE MICRONIZED 100 MG PO CAPS
100.0000 mg | ORAL_CAPSULE | Freq: Every evening | ORAL | 4 refills | Status: AC
  Filled 2023-11-17: qty 90, 90d supply, fill #0
  Filled 2024-03-24: qty 90, 90d supply, fill #1
  Filled 2024-06-28: qty 90, 90d supply, fill #2

## 2023-11-17 MED ORDER — ESTRADIOL 0.075 MG/24HR TD PTTW
1.0000 | MEDICATED_PATCH | TRANSDERMAL | 4 refills | Status: AC
  Filled 2023-11-17: qty 24, 84d supply, fill #0
  Filled 2024-02-17: qty 24, 84d supply, fill #1
  Filled 2024-06-01 – 2024-06-28 (×4): qty 24, 84d supply, fill #2
  Filled 2024-06-28: qty 8, 28d supply, fill #2

## 2023-11-17 NOTE — Progress Notes (Signed)
   Miranda [name] 22-Aug-1960 [id number]   History: Postmenopausal 63 y.o. presents for annual exam. Has breakthrough hot flashes on current dose of HRT. Would like to increase. Otherwise doing well.   Gynecologic History Postmenopausal Last Pap: 6/23. Results were: normal Last mammogram: 5/24. Results were: normal Last colonoscopy: 2016 DEXA: never   Obstetric History OB History  Gravida Para Term Preterm AB Living  0 0 0 0 0 0  SAB IAB Ectopic Multiple Live Births  0 0 0 0   Obstetric Comments  No pregnancy but 1 adopted       11/17/2023    1:41 PM 12/18/2022   10:51 AM 06/24/2022    2:42 PM  Depression screen PHQ 2/9  Decreased Interest 0 0 0  Down, Depressed, Hopeless 0 0 0  PHQ - 2 Score 0 0 0     The following portions of the patient's history were reviewed and updated as appropriate: allergies, current medications, past family history, past medical history, past social history, past surgical history, and problem list.  Review of Systems Pertinent items noted in HPI and remainder of comprehensive ROS otherwise negative.  Past medical history, past surgical history, family history and social history were all reviewed and documented in the EPIC chart.  Exam:  Vitals:   11/17/23 1341  BP: 108/72  Pulse: 90  SpO2: 96%  Weight: 147 lb (66.7 kg)  Height: 5' 3.25 (1.607 m)   Body mass index is 25.83 kg/m.  General appearance:  Normal Thyroid :  Symmetrical, normal in size, without palpable masses or nodularity. Respiratory  Auscultation:  Clear without wheezing or rhonchi Cardiovascular  Auscultation:  Regular rate, without rubs, murmurs or gallops  Edema/varicosities:  Not grossly evident Abdominal  Soft,nontender, without masses, guarding or rebound.  Liver/spleen:  No organomegaly noted  Hernia:  None appreciated  Skin  Inspection:  Grossly normal Breasts: Examined lying and sitting.   Right: Without masses, retractions, nipple discharge or  axillary adenopathy.   Left: Without masses, retractions, nipple discharge or axillary adenopathy. Genitourinary   Inguinal/mons:  Normal without inguinal adenopathy  External genitalia:  Normal appearing vulva with no masses, tenderness, or lesions  BUS/Urethra/Skene's glands:  Normal  Vagina:  Normal appearing with normal color and discharge, no lesions. Atrophy: mild   Cervix:  Normal appearing without discharge or lesions  Uterus:  Normal in size, shape and contour.  Midline and mobile, nontender  Adnexa/parametria:     Rt: Normal in size, without masses or tenderness.   Lt: Normal in size, without masses or tenderness.  Anus and perineum: Normal    Darice Hoit, CMA present for exam  Assessment/Plan:   1. Well woman exam with routine gynecological exam (Primary) Pap 2028 Schedule mammogram DEXA ordered  2. Hormone replacement therapy (HRT) Will increase dosing to help with hot flashes  3. Vasomotor symptoms due to menopause - estradiol  (VIVELLE -DOT) 0.075 MG/24HR; Place 1 patch onto the skin 2 (two) times a week.  Dispense: 24 patch; Refill: 4 - progesterone  (PROMETRIUM ) 100 MG capsule; Take 1 capsule (100 mg total) by mouth at bedtime.  Dispense: 90 capsule; Refill: 4    Discussed SBE, colonoscopy and DEXA screening as directed. Recommend of exercise weekly, including weight bearing exercise. Encouraged the use of seatbelts and sunscreen.  Return in 1 year for annual or sooner prn.  Zylah Elsbernd B WHNP-BC, 1:59 PM 11/17/2023

## 2023-11-17 NOTE — Patient Instructions (Signed)
 Preventive Care 16-63 Years Old, Female  Preventive care refers to lifestyle choices and visits with your health care provider that can promote health and wellness. Preventive care visits are also called wellness exams.  What can I expect for my preventive care visit?  Counseling  Your health care provider may ask you questions about your:  Medical history, including:  Past medical problems.  Family medical history.  Pregnancy history.  Current health, including:  Menstrual cycle.  Method of birth control.  Emotional well-being.  Home life and relationship well-being.  Sexual activity and sexual health.  Lifestyle, including:  Alcohol, nicotine or tobacco, and drug use.  Access to firearms.  Diet, exercise, and sleep habits.  Work and work Astronomer.  Sunscreen use.  Safety issues such as seatbelt and bike helmet use.  Physical exam  Your health care provider will check your:  Height and weight. These may be used to calculate your BMI (body mass index). BMI is a measurement that tells if you are at a healthy weight.  Waist circumference. This measures the distance around your waistline. This measurement also tells if you are at a healthy weight and may help predict your risk of certain diseases, such as type 2 diabetes and high blood pressure.  Heart rate and blood pressure.  Body temperature.  Skin for abnormal spots.  What immunizations do I need?    Vaccines are usually given at various ages, according to a schedule. Your health care provider will recommend vaccines for you based on your age, medical history, and lifestyle or other factors, such as travel or where you work.  What tests do I need?  Screening  Your health care provider may recommend screening tests for certain conditions. This may include:  Lipid and cholesterol levels.  Diabetes screening. This is done by checking your blood sugar (glucose) after you have not eaten for a while (fasting).  Pelvic exam and Pap test.  Hepatitis B test.  Hepatitis C  test.  HIV (human immunodeficiency virus) test.  STI (sexually transmitted infection) testing, if you are at risk.  Lung cancer screening.  Colorectal cancer screening.  Mammogram. Talk with your health care provider about when you should start having regular mammograms. This may depend on whether you have a family history of breast cancer.  BRCA-related cancer screening. This may be done if you have a family history of breast, ovarian, tubal, or peritoneal cancers.  Bone density scan. This is done to screen for osteoporosis.  Talk with your health care provider about your test results, treatment options, and if necessary, the need for more tests.  Follow these instructions at home:  Eating and drinking    Eat a diet that includes fresh fruits and vegetables, whole grains, lean protein, and low-fat dairy products.  Take vitamin and mineral supplements as recommended by your health care provider.  Do not drink alcohol if:  Your health care provider tells you not to drink.  You are pregnant, may be pregnant, or are planning to become pregnant.  If you drink alcohol:  Limit how much you have to 0-1 drink a day.  Know how much alcohol is in your drink. In the [place] one drink equals one 12 oz bottle of beer (355 mL), one 5 oz glass of wine (148 mL), or one 1 oz glass of hard liquor (44 mL).  Lifestyle  Brush your teeth every morning and night with fluoride toothpaste. Floss one time each day.  Exercise for at least  30 minutes 5 or more days each week.  Do not use any products that contain nicotine or tobacco. These products include cigarettes, chewing tobacco, and vaping devices, such as e-cigarettes. If you need help quitting, ask your health care provider.  Do not use drugs.  If you are sexually active, practice safe sex. Use a condom or other form of protection to prevent STIs.  If you do not wish to become pregnant, use a form of birth control. If you plan to become pregnant, see your health care provider for a  prepregnancy visit.  Take aspirin only as told by your health care provider. Make sure that you understand how much to take and what form to take. Work with your health care provider to find out whether it is safe and beneficial for you to take aspirin daily.  Find healthy ways to manage stress, such as:  Meditation, yoga, or listening to music.  Journaling.  Talking to a trusted person.  Spending time with friends and family.  Minimize exposure to UV radiation to reduce your risk of skin cancer.  Safety  Always wear your seat belt while driving or riding in a vehicle.  Do not drive:  If you have been drinking alcohol. Do not ride with someone who has been drinking.  When you are tired or distracted.  While texting.  If you have been using any mind-altering substances or drugs.  Wear a helmet and other protective equipment during sports activities.  If you have firearms in your house, make sure you follow all gun safety procedures.  Seek help if you have been physically or sexually abused.  What's next?  Visit your health care provider once a year for an annual wellness visit.  Ask your health care provider how often you should have your eyes and teeth checked.  Stay up to date on all vaccines.  This information is not intended to replace advice given to you by your health care provider. Make sure you discuss any questions you have with your health care provider.  Document Revised: 11/07/2020 Document Reviewed: 11/07/2020  Elsevier Patient Education  2024 [employer].

## 2023-11-20 ENCOUNTER — Other Ambulatory Visit (HOSPITAL_COMMUNITY): Payer: Self-pay

## 2023-11-23 ENCOUNTER — Other Ambulatory Visit (HOSPITAL_COMMUNITY): Payer: Self-pay

## 2023-12-01 DIAGNOSIS — F419 Anxiety disorder, unspecified: Secondary | ICD-10-CM | POA: Diagnosis not present

## 2023-12-08 ENCOUNTER — Other Ambulatory Visit (HOSPITAL_COMMUNITY): Payer: Self-pay

## 2023-12-24 ENCOUNTER — Encounter: Payer: Self-pay | Admitting: Internal Medicine

## 2023-12-24 ENCOUNTER — Ambulatory Visit: Attending: Internal Medicine | Admitting: Internal Medicine

## 2023-12-24 VITALS — BP 116/73 | HR 106 | Ht 63.25 in

## 2023-12-24 DIAGNOSIS — R0602 Shortness of breath: Secondary | ICD-10-CM

## 2023-12-24 DIAGNOSIS — M1380 Other specified arthritis, unspecified site: Secondary | ICD-10-CM | POA: Diagnosis not present

## 2023-12-24 DIAGNOSIS — G47419 Narcolepsy without cataplexy: Secondary | ICD-10-CM

## 2023-12-24 DIAGNOSIS — R002 Palpitations: Secondary | ICD-10-CM

## 2023-12-24 DIAGNOSIS — Z7189 Other specified counseling: Secondary | ICD-10-CM

## 2023-12-24 DIAGNOSIS — G4719 Other hypersomnia: Secondary | ICD-10-CM

## 2023-12-24 NOTE — Progress Notes (Signed)
 Cardiology Office Note:  .   Date:  12/24/2023  ID:  Miranda [name], DOB May 16, 1961, MRN [id number] PCP: Miranda Harlene BROCKS, MD  [facility] Providers Cardiologist:  Miranda DELENA Merck, MD    History of Present Illness: .   Miranda [name] is a 63 y.o. female.  Discussed the use of AI scribe software for clinical note transcription with the patient, who gave verbal consent to proceed.  History of Present Illness Miranda [name] is a 63 year old female with seronegative arthritis and connective tissue disease who presents with shortness of breath. She was referred by Dr. Watt for evaluation of shortness of breath and chest discomfort.  She experiences shortness of breath, which began after a COVID-19 infection earlier this year, and becomes winded more easily, especially when climbing stairs. She has no chest pain during exertion but describes sporadic, non-exertional chest tightness. A coronary calcium CT in 2020 showed a score of zero, and both an echocardiogram and exercise tolerance test were normal.  She has a family history of heart disease, with her birth mother having died of a heart attack at age 27 and both biological parents having a history of heavy smoking and drinking. She is concerned about her cardiovascular health as she ages.  She experiences occasional palpitations, particularly when taking allergy medications, but these are not consistent or severe. She is currently on Provigil  for daytime sleepiness and Adderall for ADHD symptoms. Has been previously screened for sleep apnea which was negative.    ROS: negative except per HPI above.  Studies Reviewed: .        Results LABS HDL: 59 (11/2022) LDL: 114 (11/2022) Triglycerides: 74 (11/2022)  RADIOLOGY Coronary calcium CT: Calcium score of zero (06/2018)  DIAGNOSTIC Echocardiogram: Grossly normal (2020) Exercise tolerance test: Normal (2020) Risk Assessment/Calculations:        Physical Exam:   VS:  BP 116/73   Pulse (!) 106   Ht 5' 3.25 (1.607 m)   LMP 09/06/2012   SpO2 95%   BMI 25.83 kg/m    Wt Readings from Last 3 Encounters:  11/17/23 147 lb (66.7 kg)  08/12/23 140 lb (63.5 kg)  08/05/23 144 lb 6.4 oz (65.5 kg)     Physical Exam GENERAL: Alert, cooperative, well developed, no acute distress. HEENT: Normocephalic, normal oropharynx, moist mucous membranes. CHEST: Clear to auscultation bilaterally, no wheezes, rhonchi, or crackles. CARDIOVASCULAR: Normal heart rate and rhythm, S1 and S2 normal without murmurs. ABDOMEN: Soft, non-tender, non-distended, without organomegaly, normal bowel sounds. EXTREMITIES: No cyanosis or edema. NEUROLOGICAL: Cranial nerves grossly intact, moves all extremities without gross motor or sensory deficit.   ASSESSMENT AND PLAN: .    Assessment and Plan Assessment & Plan Shortness of breath Intermittent, possibly post-COVID-19 related. Improved since infection. - Consider repeat echocardiogram if symptoms persist or worsen.  Palpitations Intermittent, possibly related to allergy medications. Not severe. - monitor symptoms, no red flag symptoms  Hyperlipidemia LDL at 114 mg/dL, goal <899 mg/dL. Previous coronary calcium score zero. Discussed dietary modifications to reduce LDL. - Implement dietary changes to reduce LDL cholesterol, focusing on reducing saturated fats and moderating intake of high-fat foods. - Re-evaluate cholesterol levels in 6 months to 1 year. - Consider repeating coronary calcium score if cholesterol levels do not improve since it has been >/=5 years  Excessive daytime sleepiness Managed with Provigil  and Adderall. Possible sleep apnea due to recent snoring. Discussed home sleep test option. - Consider home sleep test to evaluate for  sleep apnea.  Seronegative arthritis Exacerbated post-COVID-19. Previously on Remicade, discontinued due to concern of side effects. Prefers non-medication  management unless symptoms worsen. - discussed cardiovascular risk associated with inflammatory arthropathies.  Follow-up Discussed importance of lab work prior to next appointment to evaluate dietary impact on cholesterol. - Schedule follow-up appointment in 1 year. - Obtain lab work, including cholesterol levels, prior to next appointment.   Miranda Merck, MD, FACC

## 2023-12-24 NOTE — Patient Instructions (Signed)
 Medication Instructions:  No Changes *If you need a refill on your cardiac medications before your next appointment, please call your pharmacy*  Lab Work: FASTING Lipid panel, CMET, Lipoprotein A, to do in about one Year, right before returning to see Dr. Loni for your one year follow up in late July 2026. You can go to any [facility], or back to [address] [place], [place] on [address], no appointment needed.   Testing/Procedures: WatchPAT?  Is a FDA cleared portable home sleep study test that uses a watch and 3 points of contact to monitor 7 different channels, including your heart rate, oxygen saturations, body position, snoring, and chest motion.  The study is easy to use from the comfort of your own home and accurately detect sleep apnea.  Before bed, you attach the chest sensor, attached the sleep apnea bracelet to your nondominant hand, and attach the finger probe.  After the study, the raw data is downloaded from the watch and scored for apnea events.   For more information: [email or web address]  Patient Testing Instructions:  Do not put battery into the device until bedtime when you are ready to begin the test. Please call the support number if you need assistance after following the instructions below: 24 hour support line- [phone] or [facility] support at [phone] (option 2)  Download the [facility] WatchPAT One app through the google play store or App Store  Be sure to turn on or enable access to bluetooth in settlings on your smartphone/ device  Make sure no other bluetooth devices are on and within the vicinity of your smartphone/ device and WatchPAT watch during testing.  Make sure to leave your smart phone/ device plugged in and charging all night.  When ready for bed:  Follow the instructions step by step in the WatchPAT One App to activate the testing device. For additional instructions, including video instruction, visit the WatchPAT One video on  Youtube. You can search for WatchPat One within Youtube (video is 4 minutes and 18 seconds) or enter: https://youtube/watch?[email or web address]_[email or web address] Please note: You will be prompted to enter a Pin to connect via bluetooth when starting the test. The PIN will be assigned to you when you receive the test.  The device is disposable, but it recommended that you retain the device until you receive a call letting you know the study has been received and the results have been interpreted.  We will let you know if the study did not transmit to us  properly after the test is completed. You do not need to call us  to confirm the receipt of the test.  Please complete the test within 48 hours of receiving PIN.   Frequently Asked Questions:  What is Watch Bruna one?  A single use fully disposable home sleep apnea testing device and will not need to be returned after completion.  What are the requirements to use WatchPAT one?  The be able to have a successful watchpat one sleep study, you should have your Watch pat one device, your smart phone, watch pat one app, your PIN number and Internet access What type of phone do I need?  You should have a smart phone that uses Android 5.1 and above or any Iphone with IOS 10 and above How can I download the WatchPAT one app?  Based on your device type search for WatchPAT one app either in google play for android devices or APP store for Iphone's Where will I get my PIN for the study?  Your PIN  will be provided by your physician's office. It is used for authentication and if you lose/forget your PIN, please reach out to your providers office.  I do not have Internet at home. Can I do WatchPAT one study?  WatchPAT One needs Internet connection throughout the night to be able to transmit the sleep data. You can use your home/local internet or your cellular's data package. However, it is always recommended to use home/local Internet. It is estimated that between 20MB-30MB will be used with  each study.However, the application will be looking for space in the phone to start the study.  What happens if I lose internet or bluetooth connection?  During the internet disconnection, your phone will not be able to transmit the sleep data. All the data, will be stored in your phone. As soon as the internet connection is back on, the phone will being sending the sleep data. During the bluetooth disconnection, WatchPAT one will not be able to to send the sleep data to your phone. Data will be kept in the WatchPAT one until two devices have bluetooth connection back on. As soon as the connection is back on, WatchPAT one will send the sleep data to the phone.  How long do I need to wear the WatchPAT one?  After you start the study, you should wear the device at least 6 hours.  How far should I keep my phone from the device?  During the night, your phone should be within 15 feet.  What happens if I leave the room for restroom or other reasons?  Leaving the room for any reason will not cause any problem. As soon as your get back to the room, both devices will reconnect and will continue to send the sleep data. Can I use my phone during the sleep study?  Yes, you can use your phone as usual during the study. But it is recommended to put your watchpat one on when you are ready to go to bed.  How will I get my study results?  A soon as you completed your study, your sleep data will be sent to the provider. They will then share the results with you when they are ready.     Follow-Up: At [facility], you and your health needs are our priority.  As part of our continuing mission to provide you with exceptional heart care, our providers are all part of one team.  This team includes your primary Cardiologist (physician) and Advanced Practice Providers or APPs (Physician Assistants and Nurse Practitioners) who all work together to provide you with the care you need, when you need it.  Your  next appointment:   1 year(s)  Provider:   Gayatri A Acharya, MD    Other Instructions Please call us  or send a MyChart message with any Cardiology related questions/concerns.  [phone].  Thank you!

## 2023-12-29 DIAGNOSIS — F419 Anxiety disorder, unspecified: Secondary | ICD-10-CM | POA: Diagnosis not present

## 2024-01-08 ENCOUNTER — Other Ambulatory Visit: Payer: Self-pay | Admitting: Internal Medicine

## 2024-01-08 ENCOUNTER — Other Ambulatory Visit (HOSPITAL_COMMUNITY): Payer: Self-pay

## 2024-01-08 MED ORDER — AMPHETAMINE-DEXTROAMPHETAMINE 10 MG PO TABS
10.0000 mg | ORAL_TABLET | Freq: Every day | ORAL | 0 refills | Status: DC | PRN
  Filled 2024-01-08: qty 60, 30d supply, fill #0

## 2024-01-08 MED ORDER — AMPHETAMINE-DEXTROAMPHET ER 30 MG PO CP24
30.0000 mg | ORAL_CAPSULE | Freq: Every day | ORAL | 0 refills | Status: DC
  Filled 2024-01-08: qty 31, 31d supply, fill #0

## 2024-01-08 NOTE — Telephone Encounter (Signed)
 Adderall scripts refilled

## 2024-01-08 NOTE — Telephone Encounter (Signed)
 Pt is requesting a refill on her adderall, has not been seen in over a year. Please advise, thank you!

## 2024-01-12 ENCOUNTER — Other Ambulatory Visit (HOSPITAL_COMMUNITY): Payer: Self-pay

## 2024-01-12 ENCOUNTER — Encounter (HOSPITAL_COMMUNITY): Payer: Self-pay

## 2024-01-12 ENCOUNTER — Other Ambulatory Visit: Payer: Self-pay | Admitting: Internal Medicine

## 2024-01-13 ENCOUNTER — Other Ambulatory Visit: Payer: Self-pay | Admitting: Internal Medicine

## 2024-01-14 ENCOUNTER — Other Ambulatory Visit (HOSPITAL_COMMUNITY): Payer: Self-pay

## 2024-01-14 MED ORDER — MODAFINIL 100 MG PO TABS
100.0000 mg | ORAL_TABLET | Freq: Every day | ORAL | 5 refills | Status: DC
  Filled 2024-01-14: qty 30, 30d supply, fill #0
  Filled 2024-02-16: qty 30, 30d supply, fill #1
  Filled 2024-03-22: qty 30, 30d supply, fill #2
  Filled 2024-03-23: qty 30, 30d supply, fill #0
  Filled 2024-04-27: qty 30, 30d supply, fill #1
  Filled 2024-06-01: qty 30, 30d supply, fill #2

## 2024-01-14 NOTE — Telephone Encounter (Signed)
Modafinil refilled.

## 2024-01-14 NOTE — Telephone Encounter (Signed)
**Note De-identified  [place] Obfuscation** Please advise 

## 2024-01-15 ENCOUNTER — Other Ambulatory Visit (HOSPITAL_COMMUNITY): Payer: Self-pay

## 2024-01-27 DIAGNOSIS — F419 Anxiety disorder, unspecified: Secondary | ICD-10-CM | POA: Diagnosis not present

## 2024-02-16 ENCOUNTER — Other Ambulatory Visit: Payer: Self-pay

## 2024-02-17 ENCOUNTER — Other Ambulatory Visit: Payer: Self-pay

## 2024-02-17 ENCOUNTER — Other Ambulatory Visit: Payer: Self-pay | Admitting: Internal Medicine

## 2024-02-17 ENCOUNTER — Other Ambulatory Visit (HOSPITAL_COMMUNITY): Payer: Self-pay

## 2024-02-17 MED ORDER — AMPHETAMINE-DEXTROAMPHET ER 30 MG PO CP24
30.0000 mg | ORAL_CAPSULE | Freq: Every day | ORAL | 0 refills | Status: DC
  Filled 2024-02-17: qty 31, 31d supply, fill #0

## 2024-02-17 NOTE — Telephone Encounter (Signed)
 Please advise, thank you.

## 2024-02-17 NOTE — Telephone Encounter (Signed)
 Adderall  refilled

## 2024-03-03 ENCOUNTER — Other Ambulatory Visit: Payer: Self-pay | Admitting: Family Medicine

## 2024-03-03 ENCOUNTER — Other Ambulatory Visit (HOSPITAL_COMMUNITY): Payer: Self-pay

## 2024-03-03 MED ORDER — AMPHETAMINE-DEXTROAMPHETAMINE 10 MG PO TABS
10.0000 mg | ORAL_TABLET | Freq: Every day | ORAL | 0 refills | Status: DC | PRN
  Filled 2024-03-03: qty 60, 30d supply, fill #0

## 2024-03-04 ENCOUNTER — Telehealth: Payer: Self-pay

## 2024-03-04 NOTE — Telephone Encounter (Signed)
 Ordering provider: Dr. Loni Associated diagnoses:  Excessive daytime sleepiness [G47.19]  Patient NOT notified of PIN (1234) on 03/04/2024   Left voicemail for patient to call us  back  Phone note routed to covering staff for follow-up.

## 2024-03-08 DIAGNOSIS — F419 Anxiety disorder, unspecified: Secondary | ICD-10-CM | POA: Diagnosis not present

## 2024-03-11 NOTE — Telephone Encounter (Signed)
**Note De-Identified Miranda [name] Obfuscation** The pt states that she will come by the office soon to pick up a WatchPAT One-HST Device with instructions.

## 2024-03-11 NOTE — Telephone Encounter (Signed)
  Patient called back she said she will come by at the office on Monday 03/14/24 before noon to pick up watchpat device

## 2024-03-14 ENCOUNTER — Encounter (HOSPITAL_BASED_OUTPATIENT_CLINIC_OR_DEPARTMENT_OTHER): Payer: Self-pay | Admitting: Cardiology

## 2024-03-14 DIAGNOSIS — G4719 Other hypersomnia: Secondary | ICD-10-CM

## 2024-03-14 NOTE — Telephone Encounter (Signed)
**Note De-Identified Lovella Hardie Obfuscation** Patient agreement reviewed and signed on 03/14/2024.  WatchPAT issued to patient on 03/14/2024 by Khamarion Bjelland, Avelina HERO, LPN. Patient aware to not open the WatchPAT box until contacted with the activation PIN. Patient profile initialized in CloudPAT on 03/14/2024 by Avelina BATTLE Ahad Colarusso, LPN. Device serial number: [id number]

## 2024-03-15 DIAGNOSIS — L981 Factitial dermatitis: Secondary | ICD-10-CM | POA: Diagnosis not present

## 2024-03-15 DIAGNOSIS — D1801 Hemangioma of skin and subcutaneous tissue: Secondary | ICD-10-CM | POA: Diagnosis not present

## 2024-03-15 DIAGNOSIS — L814 Other melanin hyperpigmentation: Secondary | ICD-10-CM | POA: Diagnosis not present

## 2024-03-15 DIAGNOSIS — L821 Other seborrheic keratosis: Secondary | ICD-10-CM | POA: Diagnosis not present

## 2024-03-22 ENCOUNTER — Other Ambulatory Visit: Payer: Self-pay

## 2024-03-23 ENCOUNTER — Other Ambulatory Visit (HOSPITAL_COMMUNITY): Payer: Self-pay

## 2024-03-24 ENCOUNTER — Ambulatory Visit: Attending: Internal Medicine

## 2024-03-24 ENCOUNTER — Other Ambulatory Visit: Payer: Self-pay | Admitting: Family Medicine

## 2024-03-24 ENCOUNTER — Encounter: Payer: Self-pay | Admitting: Internal Medicine

## 2024-03-24 ENCOUNTER — Other Ambulatory Visit (HOSPITAL_COMMUNITY): Payer: Self-pay

## 2024-03-24 DIAGNOSIS — G4719 Other hypersomnia: Secondary | ICD-10-CM

## 2024-03-24 DIAGNOSIS — F39 Unspecified mood [affective] disorder: Secondary | ICD-10-CM

## 2024-03-24 MED ORDER — ESCITALOPRAM OXALATE 20 MG PO TABS
20.0000 mg | ORAL_TABLET | Freq: Every day | ORAL | 0 refills | Status: DC
  Filled 2024-03-24: qty 30, 30d supply, fill #0

## 2024-03-24 NOTE — Procedures (Signed)
   SLEEP STUDY REPORT Patient Information Study Date: 03/14/2024 Patient Name: Miranda [name] Patient ID: [id number] Birth Date: [birth year] Age: 63 Gender: BMI: 26.2 (W=148 lb, H=5' 3'') Referring Physician: Jacki Merck, MD  TEST DESCRIPTION: Home sleep apnea testing was completed using the WatchPat, a Type 1 device, utilizing peripheral arterial tonometry (PAT), chest movement, actigraphy, pulse oximetry, pulse rate, body position and snore. AHI was calculated with apnea and hypopnea using valid sleep time as the denominator. RDI includes apneas, hypopneas, and RERAs. The data acquired and the scoring of sleep and all associated events were performed in accordance with the recommended standards and specifications as outlined in the AASM Manual for the Scoring of Sleep and Associated Events 2.2.0 (2015).  FINDINGS: 1. No evidence of Obstructive Sleep Apnea with AHI 3.4/hr. 2. No Central Sleep Apnea. 3. Oxygen desaturations as low as 87%. 4. Mild snoring was present. O2 sats were < 88% for 2.6 minutes. 5. Total sleep time was 7 hrs and 36 min. 6. 12.2% of total sleep time was spent in REM sleep. 7. Normal sleep onset latency at 15 min. 8. Normal REM sleep onset latency at 88 min. 9. Total awakenings were 8.  DIAGNOSIS: Normal study with no significant sleep disordered breathing.  RECOMMENDATIONS: 1. Normal study with no significant sleep disordered breathing. 2. Healthy sleep recommendations include: adequate nightly sleep (normal 7-9 hrs/night), avoidance of caffeine after noon and alcohol near bedtime, and maintaining a sleep environment that is cool, dark and quiet. 3. Weight loss for overweight patients is recommended. 4. Snoring recommendations include: weight loss where appropriate, side sleeping, and avoidance of alcohol before bed. 5. Operation of motor vehicle or dangerous equipment must be avoided when feeling drowsy, excessively sleepy, or mentally fatigued. 6.  An ENT consultation which may be useful for specific causes of and possible treatment of bothersome snoring . 7. Weight loss may be of benefit in reducing the severity of snoring.   Signature: Wilbert Bihari, MD; [facility]; Diplomat, American Board of Sleep Medicine Electronically Signed: 03/24/2024 4:44:16 PM

## 2024-03-24 NOTE — Procedures (Signed)
 SABRA

## 2024-03-29 ENCOUNTER — Encounter: Payer: Self-pay | Admitting: *Deleted

## 2024-04-02 ENCOUNTER — Ambulatory Visit: Payer: Self-pay | Admitting: Internal Medicine

## 2024-04-02 NOTE — Progress Notes (Signed)
 No sleep apnea detected. Mychart sent

## 2024-04-11 ENCOUNTER — Encounter: Payer: Self-pay | Admitting: Family Medicine

## 2024-04-12 ENCOUNTER — Telehealth: Payer: Self-pay | Admitting: *Deleted

## 2024-04-12 NOTE — Telephone Encounter (Signed)
-----   Message from Wilbert Bihari sent at 03/24/2024  4:48 PM EDT ----- Please let patient know that sleep study showed no significant sleep apnea.

## 2024-04-26 ENCOUNTER — Telehealth: Payer: Self-pay

## 2024-04-26 NOTE — Telephone Encounter (Signed)
*  [facility]  Pharmacy Patient Advocate Encounter   Received notification from Fax that prior authorization for Modafinil  is required/requested.   Insurance verification completed.   The patient is insured through [facility].   Per test claim: PA required; However, NEW/RECENT labs/notes are needed to complete & submit PA request. Please see below.

## 2024-04-27 ENCOUNTER — Other Ambulatory Visit (HOSPITAL_COMMUNITY): Payer: Self-pay

## 2024-04-27 ENCOUNTER — Other Ambulatory Visit: Payer: Self-pay

## 2024-04-27 ENCOUNTER — Other Ambulatory Visit: Payer: Self-pay | Admitting: Family Medicine

## 2024-04-27 DIAGNOSIS — F419 Anxiety disorder, unspecified: Secondary | ICD-10-CM | POA: Diagnosis not present

## 2024-04-27 MED ORDER — AMPHETAMINE-DEXTROAMPHETAMINE 10 MG PO TABS
10.0000 mg | ORAL_TABLET | Freq: Every day | ORAL | 0 refills | Status: DC | PRN
  Filled 2024-04-27: qty 60, 30d supply, fill #0

## 2024-04-27 MED ORDER — AMPHETAMINE-DEXTROAMPHET ER 30 MG PO CP24
30.0000 mg | ORAL_CAPSULE | Freq: Every day | ORAL | 0 refills | Status: DC
  Filled 2024-04-27: qty 31, 31d supply, fill #0

## 2024-04-27 NOTE — Telephone Encounter (Signed)
 Requesting: Adderall 30 MG Contract: N/A UDS: N/A Last Visit: 08/05/2023 Next Visit: N/A Last Refill: 02/17/2024  Please Advise    Requesting: Adderall 10 MG Contract: N/A UDS: N/A Last Visit: 08/05/2023 Next Visit: N/A Last Refill: 03/03/2024  Please Advise

## 2024-05-05 NOTE — Telephone Encounter (Signed)
 Dr Neysa, do you have a preferred time for pt to come in? RX can not have a PA due to the gap in pt's lov.

## 2024-05-06 NOTE — Telephone Encounter (Signed)
 Amy- please see if we can find a held-spot with me, otherwise with any sleep provider or APP, so she can qualify for PA for modafinil  to continue therapy

## 2024-05-09 ENCOUNTER — Other Ambulatory Visit (HOSPITAL_COMMUNITY): Payer: Self-pay

## 2024-05-10 NOTE — Telephone Encounter (Signed)
 Called patient.  Patient has already called her PCP and they are taking over her refills and plan of care regarding the modafinil  therapy.  Informed Dr. Neysa and patient to call if needs any further assistance.

## 2024-06-01 ENCOUNTER — Other Ambulatory Visit: Payer: Self-pay

## 2024-06-01 ENCOUNTER — Other Ambulatory Visit: Payer: Self-pay | Admitting: Family Medicine

## 2024-06-01 ENCOUNTER — Encounter: Payer: Self-pay | Admitting: Pharmacy Technician

## 2024-06-01 ENCOUNTER — Other Ambulatory Visit (HOSPITAL_COMMUNITY): Payer: Self-pay

## 2024-06-01 DIAGNOSIS — F39 Unspecified mood [affective] disorder: Secondary | ICD-10-CM

## 2024-06-01 MED ORDER — ESCITALOPRAM OXALATE 20 MG PO TABS
20.0000 mg | ORAL_TABLET | Freq: Every day | ORAL | 0 refills | Status: DC
  Filled 2024-06-01: qty 30, 30d supply, fill #0

## 2024-06-02 ENCOUNTER — Other Ambulatory Visit: Payer: Self-pay

## 2024-06-03 ENCOUNTER — Other Ambulatory Visit: Payer: Self-pay

## 2024-06-11 ENCOUNTER — Other Ambulatory Visit (HOSPITAL_COMMUNITY): Payer: Self-pay

## 2024-06-12 NOTE — Patient Instructions (Signed)
 It was good to see you today, I will be in touch with your labs

## 2024-06-12 NOTE — Progress Notes (Unsigned)
 [facility] [address], [address] [place], [place] [address] [phone]  Date:  06/15/2024   Name:  Miranda [name]   DOB:  [birth year]   MRN:  [id number]  PCP:  Watt Harlene BROCKS, MD    Chief Complaint: No chief complaint on file.   History of Present Illness:  Miranda [name] is a 64 y.o. very pleasant female patient who presents with the following:  Patient seen today for physical exam.  I saw her most recently in March 2025 History of seronegative arthritis, connective tissue disease, difficulty with sleep and hypersomnia, mood disorder, cervical spine disease, palpitations, narcolepsy/hypersomnia  Last year she had concern about excessive sleepiness, I sent her for a sleep study which did not show sleep apnea She also saw cardiology last year for concern of shortness of breath and chest discomfort  Flu shot Recommend pneumococcal vaccination Mammogram can be updated Colonoscopy due this year Completed 2023-normal.  Can update if she would like She has completed Shingrix Can update blood work  Estrogen patch, progesterone  Provigil -prescribed by Dr. Neysa with pulmonology Adderall-prescribed by me Lexapro   Discussed the use of AI scribe software for clinical note transcription with the patient, who gave verbal consent to proceed.  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   ALLERGY 05/10/2007    Past Medical History:  Diagnosis Date   Abnormal  Pap smear of cervix 1996   Allergy    Allergy, unspecified not elsewhere classified    IBS (irritable bowel syndrome)    Idiopathic hypersomnia    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  Medications Ordered Prior to Encounter[3]  Review of Systems:  As per HPI- otherwise negative.   Physical Examination: There were no vitals filed for this visit. There were no vitals filed for this visit. There is no height or weight on file to calculate BMI. Ideal Body Weight:    GEN: no acute distress. HEENT: Atraumatic, Normocephalic.  Ears and Nose: No external deformity. CV: RRR, No M/G/R. No JVD. No thrill. No extra heart sounds. PULM: CTA B, no wheezes, crackles, rhonchi. No retractions. No resp. distress. No accessory muscle use. ABD: S, NT, ND, +BS. No rebound. No HSM. EXTR: No c/c/e PSYCH: Normally interactive. Conversant.    Assessment and Plan: No diagnosis found.  Assessment & Plan   Signed Harlene Watt, MD    [1]  Social History Tobacco Use   Smoking status: Never  Passive exposure: Past   Smokeless tobacco: Never  Vaping Use   Vaping status: Never Used  Substance Use Topics   Alcohol use: Not Currently   Drug use: No  [2]  Allergies Allergen Reactions   Keflex [Cephalexin] Itching  [3]  Current Outpatient Medications on File Prior to Visit  Medication Sig Dispense Refill   amphetamine -dextroamphetamine  (ADDERALL XR) 30 MG 24 hr capsule Take 1 capsule (30 mg total) by mouth daily. 31 capsule 0   amphetamine -dextroamphetamine  (ADDERALL)  10 MG tablet Take 1-2 tablets (10-20 mg total) by mouth daily as needed. 60 tablet 0   cyclobenzaprine  (FLEXERIL ) 10 MG tablet TAKE 1 TABLET BY MOUTH TWICE A DAY AS NEEDED FOR MUSCLE SPASM 30 tablet 0   escitalopram  (LEXAPRO ) 20 MG tablet Take 1 tablet (20 mg total) by mouth daily. Needs appt 30 tablet 0   estradiol  (VIVELLE -DOT) 0.075 MG/24HR Place 1 patch onto the skin 2 (two) times a week. 24 patch 4   Ibuprofen (ADVIL PO) Take 200 mg by mouth as needed (pain).      modafinil  (PROVIGIL ) 100 MG tablet Take 1 tablet (100 mg total) by mouth daily. 30 tablet 5   progesterone  (PROMETRIUM ) 100 MG capsule Take 1 capsule (100 mg total) by mouth at bedtime. 90 capsule 4   No current facility-administered medications on file prior to visit.   "

## 2024-06-15 ENCOUNTER — Other Ambulatory Visit: Payer: Self-pay

## 2024-06-15 ENCOUNTER — Other Ambulatory Visit (HOSPITAL_COMMUNITY): Payer: Self-pay

## 2024-06-15 ENCOUNTER — Encounter: Payer: Self-pay | Admitting: Family Medicine

## 2024-06-15 ENCOUNTER — Ambulatory Visit: Admitting: Family Medicine

## 2024-06-15 VITALS — BP 98/70 | HR 85 | Temp 98.8°F | Resp 16 | Ht 63.25 in | Wt 152.0 lb

## 2024-06-15 DIAGNOSIS — G47419 Narcolepsy without cataplexy: Secondary | ICD-10-CM | POA: Diagnosis not present

## 2024-06-15 DIAGNOSIS — Z23 Encounter for immunization: Secondary | ICD-10-CM | POA: Diagnosis not present

## 2024-06-15 DIAGNOSIS — Z0000 Encounter for general adult medical examination without abnormal findings: Secondary | ICD-10-CM

## 2024-06-15 DIAGNOSIS — E559 Vitamin D deficiency, unspecified: Secondary | ICD-10-CM | POA: Diagnosis not present

## 2024-06-15 DIAGNOSIS — Z13220 Encounter for screening for lipoid disorders: Secondary | ICD-10-CM | POA: Diagnosis not present

## 2024-06-15 DIAGNOSIS — Z130 Encounter for screening for diseases of the blood and blood-forming organs and certain disorders involving the immune mechanism: Secondary | ICD-10-CM

## 2024-06-15 DIAGNOSIS — F988 Other specified behavioral and emotional disorders with onset usually occurring in childhood and adolescence: Secondary | ICD-10-CM | POA: Diagnosis not present

## 2024-06-15 DIAGNOSIS — Z131 Encounter for screening for diabetes mellitus: Secondary | ICD-10-CM | POA: Diagnosis not present

## 2024-06-15 DIAGNOSIS — Z1329 Encounter for screening for other suspected endocrine disorder: Secondary | ICD-10-CM

## 2024-06-15 DIAGNOSIS — Z1211 Encounter for screening for malignant neoplasm of colon: Secondary | ICD-10-CM

## 2024-06-15 MED ORDER — AMPHETAMINE-DEXTROAMPHETAMINE 10 MG PO TABS
10.0000 mg | ORAL_TABLET | Freq: Every day | ORAL | 0 refills | Status: AC | PRN
  Filled 2024-06-15: qty 60, 30d supply, fill #0

## 2024-06-15 MED ORDER — AMPHETAMINE-DEXTROAMPHET ER 30 MG PO CP24
30.0000 mg | ORAL_CAPSULE | Freq: Every day | ORAL | 0 refills | Status: AC
  Filled 2024-06-15: qty 31, 31d supply, fill #0

## 2024-06-15 MED ORDER — MODAFINIL 200 MG PO TABS
200.0000 mg | ORAL_TABLET | Freq: Every day | ORAL | 1 refills | Status: AC
  Filled 2024-06-15: qty 30, 30d supply, fill #0

## 2024-06-16 ENCOUNTER — Encounter: Payer: Self-pay | Admitting: Family Medicine

## 2024-06-16 LAB — CBC
HCT: 44 % (ref 36.0–46.0)
Hemoglobin: 14.8 g/dL (ref 12.0–15.0)
MCHC: 33.7 g/dL (ref 30.0–36.0)
MCV: 94.3 fl (ref 78.0–100.0)
Platelets: 214 K/uL (ref 150.0–400.0)
RBC: 4.67 Mil/uL (ref 3.87–5.11)
RDW: 13 % (ref 11.5–15.5)
WBC: 8.6 K/uL (ref 4.0–10.5)

## 2024-06-16 LAB — HEMOGLOBIN A1C: Hgb A1c MFr Bld: 5.9 % (ref 4.6–6.5)

## 2024-06-16 LAB — COMPREHENSIVE METABOLIC PANEL WITH GFR
ALT: 11 U/L (ref 3–35)
AST: 16 U/L (ref 5–37)
Albumin: 4.1 g/dL (ref 3.5–5.2)
Alkaline Phosphatase: 83 U/L (ref 39–117)
BUN: 25 mg/dL — ABNORMAL HIGH (ref 6–23)
CO2: 30 meq/L (ref 19–32)
Calcium: 9.6 mg/dL (ref 8.4–10.5)
Chloride: 101 meq/L (ref 96–112)
Creatinine, Ser: 1 mg/dL (ref 0.40–1.20)
GFR: 59.75 mL/min — ABNORMAL LOW
Glucose, Bld: 86 mg/dL (ref 70–99)
Potassium: 4.1 meq/L (ref 3.5–5.1)
Sodium: 138 meq/L (ref 135–145)
Total Bilirubin: 0.4 mg/dL (ref 0.2–1.2)
Total Protein: 6.4 g/dL (ref 6.0–8.3)

## 2024-06-16 LAB — LIPID PANEL
Cholesterol: 189 mg/dL (ref 28–200)
HDL: 60.2 mg/dL
LDL Cholesterol: 96 mg/dL (ref 10–99)
NonHDL: 129.09
Total CHOL/HDL Ratio: 3
Triglycerides: 165 mg/dL — ABNORMAL HIGH (ref 10.0–149.0)
VLDL: 33 mg/dL (ref 0.0–40.0)

## 2024-06-16 LAB — TSH: TSH: 1.92 u[IU]/mL (ref 0.35–5.50)

## 2024-06-16 LAB — VITAMIN D 25 HYDROXY (VIT D DEFICIENCY, FRACTURES): VITD: 22 ng/mL — ABNORMAL LOW (ref 30.00–100.00)

## 2024-06-17 ENCOUNTER — Other Ambulatory Visit: Payer: Self-pay

## 2024-06-17 ENCOUNTER — Other Ambulatory Visit (HOSPITAL_COMMUNITY): Payer: Self-pay

## 2024-06-18 ENCOUNTER — Other Ambulatory Visit (HOSPITAL_COMMUNITY): Payer: Self-pay

## 2024-06-20 ENCOUNTER — Other Ambulatory Visit: Payer: Self-pay

## 2024-06-20 LAB — LIPOPROTEIN A (LPA): Lipoprotein (a): 11 nmol/L

## 2024-06-21 ENCOUNTER — Other Ambulatory Visit: Payer: Self-pay

## 2024-06-21 ENCOUNTER — Ambulatory Visit: Payer: Self-pay | Admitting: Family Medicine

## 2024-06-25 ENCOUNTER — Other Ambulatory Visit (HOSPITAL_COMMUNITY): Payer: Self-pay

## 2024-06-28 ENCOUNTER — Other Ambulatory Visit: Payer: Self-pay | Admitting: Family Medicine

## 2024-06-28 ENCOUNTER — Other Ambulatory Visit (HOSPITAL_COMMUNITY): Payer: Self-pay

## 2024-06-28 ENCOUNTER — Other Ambulatory Visit: Payer: Self-pay

## 2024-06-28 DIAGNOSIS — F39 Unspecified mood [affective] disorder: Secondary | ICD-10-CM

## 2024-06-28 MED ORDER — ESCITALOPRAM OXALATE 20 MG PO TABS
20.0000 mg | ORAL_TABLET | Freq: Every day | ORAL | 1 refills | Status: AC
  Filled 2024-06-28: qty 90, 90d supply, fill #0
